# Patient Record
Sex: Female | Born: 1985 | Race: Black or African American | Hispanic: No | Smoking: Former smoker
Health system: Southern US, Community
[De-identification: ages and names within clinical notes are randomized; demographics above are authoritative.]

## PROBLEM LIST (undated history)

## (undated) DIAGNOSIS — IMO0002 Reserved for concepts with insufficient information to code with codable children: Secondary | ICD-10-CM

## (undated) DIAGNOSIS — R87619 Unspecified abnormal cytological findings in specimens from cervix uteri: Secondary | ICD-10-CM

## (undated) DIAGNOSIS — K219 Gastro-esophageal reflux disease without esophagitis: Secondary | ICD-10-CM

## (undated) DIAGNOSIS — D759 Disease of blood and blood-forming organs, unspecified: Secondary | ICD-10-CM

## (undated) DIAGNOSIS — F209 Schizophrenia, unspecified: Secondary | ICD-10-CM

## (undated) DIAGNOSIS — F32A Depression, unspecified: Secondary | ICD-10-CM

## (undated) DIAGNOSIS — D573 Sickle-cell trait: Secondary | ICD-10-CM

## (undated) DIAGNOSIS — F319 Bipolar disorder, unspecified: Secondary | ICD-10-CM

## (undated) DIAGNOSIS — F99 Mental disorder, not otherwise specified: Secondary | ICD-10-CM

## (undated) DIAGNOSIS — D649 Anemia, unspecified: Secondary | ICD-10-CM

## (undated) DIAGNOSIS — F329 Major depressive disorder, single episode, unspecified: Secondary | ICD-10-CM

## (undated) DIAGNOSIS — M419 Scoliosis, unspecified: Secondary | ICD-10-CM

## (undated) HISTORY — DX: Major depressive disorder, single episode, unspecified: F32.9

## (undated) HISTORY — DX: Disease of blood and blood-forming organs, unspecified: D75.9

## (undated) HISTORY — DX: Depression, unspecified: F32.A

## (undated) HISTORY — DX: Unspecified abnormal cytological findings in specimens from cervix uteri: R87.619

## (undated) HISTORY — PX: LAPAROSCOPIC CHOLECYSTECTOMY: SUR755

## (undated) HISTORY — DX: Anemia, unspecified: D64.9

## (undated) HISTORY — DX: Reserved for concepts with insufficient information to code with codable children: IMO0002

---
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
## 2004-04-02 ENCOUNTER — Emergency Department (HOSPITAL_COMMUNITY): Admission: EM | Admit: 2004-04-02 | Discharge: 2004-04-02 | Payer: Self-pay | Admitting: *Deleted

## 2005-02-12 ENCOUNTER — Emergency Department (HOSPITAL_COMMUNITY): Admission: EM | Admit: 2005-02-12 | Discharge: 2005-02-12 | Payer: Self-pay | Admitting: Emergency Medicine

## 2005-02-14 ENCOUNTER — Emergency Department (HOSPITAL_COMMUNITY): Admission: EM | Admit: 2005-02-14 | Discharge: 2005-02-14 | Payer: Self-pay | Admitting: Emergency Medicine

## 2005-07-19 ENCOUNTER — Emergency Department (HOSPITAL_COMMUNITY): Admission: EM | Admit: 2005-07-19 | Discharge: 2005-07-19 | Payer: Self-pay | Admitting: Emergency Medicine

## 2005-08-31 ENCOUNTER — Emergency Department (HOSPITAL_COMMUNITY): Admission: EM | Admit: 2005-08-31 | Discharge: 2005-08-31 | Payer: Self-pay | Admitting: Emergency Medicine

## 2006-05-01 ENCOUNTER — Emergency Department (HOSPITAL_COMMUNITY): Admission: EM | Admit: 2006-05-01 | Discharge: 2006-05-01 | Payer: Self-pay | Admitting: Emergency Medicine

## 2007-03-15 ENCOUNTER — Inpatient Hospital Stay (HOSPITAL_COMMUNITY): Admission: EM | Admit: 2007-03-15 | Discharge: 2007-03-16 | Payer: Self-pay | Admitting: Emergency Medicine

## 2007-05-20 ENCOUNTER — Encounter (HOSPITAL_COMMUNITY): Admission: RE | Admit: 2007-05-20 | Discharge: 2007-06-19 | Payer: Self-pay | Admitting: Orthopedic Surgery

## 2007-06-10 ENCOUNTER — Emergency Department (HOSPITAL_COMMUNITY): Admission: EM | Admit: 2007-06-10 | Discharge: 2007-06-10 | Payer: Self-pay | Admitting: Emergency Medicine

## 2008-03-01 ENCOUNTER — Other Ambulatory Visit: Admission: RE | Admit: 2008-03-01 | Discharge: 2008-03-01 | Payer: Self-pay | Admitting: Obstetrics and Gynecology

## 2008-03-08 ENCOUNTER — Ambulatory Visit (HOSPITAL_COMMUNITY): Admission: RE | Admit: 2008-03-08 | Discharge: 2008-03-08 | Payer: Self-pay | Admitting: Obstetrics & Gynecology

## 2008-08-14 ENCOUNTER — Inpatient Hospital Stay (HOSPITAL_COMMUNITY): Admission: AD | Admit: 2008-08-14 | Discharge: 2008-08-16 | Payer: Self-pay | Admitting: Obstetrics and Gynecology

## 2009-10-16 ENCOUNTER — Emergency Department (HOSPITAL_COMMUNITY): Admission: EM | Admit: 2009-10-16 | Discharge: 2009-10-16 | Payer: Self-pay | Admitting: Emergency Medicine

## 2010-01-07 NOTE — L&D Delivery Note (Signed)
Delivery Note At 1:22 PM a viable female was delivered via Vaginal, Spontaneous Delivery (Presentation: Left Occiput Anterior).  APGAR: 6, 7; weight 5 lb 0.4 oz (2280 g).   Placenta status: Intact, Spontaneous.  Cord: 3 vessels with the following complications: None.  Cord pH: n/a  Anesthesia: Epidural  Episiotomy: None Lacerations: None Suture Repair: n/a Est. Blood Loss (mL): 250  Mom to postpartum.  Baby to nursery-stable.  [name],Jennifer Barber 09/29/2010, 1:59 PM

## 2010-03-05 ENCOUNTER — Emergency Department (HOSPITAL_COMMUNITY)
Admission: EM | Admit: 2010-03-05 | Discharge: 2010-03-05 | Disposition: A | Discharge status: Home / Self Care | Payer: Medicaid Other | Attending: Emergency Medicine | Admitting: Emergency Medicine

## 2010-03-05 ENCOUNTER — Emergency Department (HOSPITAL_COMMUNITY): Payer: Medicaid Other

## 2010-03-05 DIAGNOSIS — R112 Nausea with vomiting, unspecified: Secondary | ICD-10-CM | POA: Insufficient documentation

## 2010-03-05 DIAGNOSIS — Z331 Pregnant state, incidental: Secondary | ICD-10-CM | POA: Insufficient documentation

## 2010-03-05 DIAGNOSIS — N898 Other specified noninflammatory disorders of vagina: Secondary | ICD-10-CM | POA: Insufficient documentation

## 2010-03-05 DIAGNOSIS — R109 Unspecified abdominal pain: Secondary | ICD-10-CM | POA: Insufficient documentation

## 2010-03-05 LAB — COMPREHENSIVE METABOLIC PANEL
ALT: 11 U/L (ref 0–35)
AST: 14 U/L (ref 0–37)
Albumin: 3.9 g/dL (ref 3.5–5.2)
Alkaline Phosphatase: 41 U/L (ref 39–117)
BUN: 5 mg/dL — ABNORMAL LOW (ref 6–23)
CO2: 23 mEq/L (ref 19–32)
Calcium: 9.1 mg/dL (ref 8.4–10.5)
Chloride: 102 mEq/L (ref 96–112)
Creatinine, Ser: 0.76 mg/dL (ref 0.4–1.2)
GFR calc Af Amer: >60 mL/min (ref >60)
GFR calc non Af Amer: >60 mL/min (ref >60)
Glucose, Bld: 91 mg/dL (ref 70–99)
Potassium: 3.4 mEq/L — ABNORMAL LOW (ref 3.5–5.1)
Sodium: 135 mEq/L (ref 135–145)
Total Bilirubin: 0.7 mg/dL (ref 0.3–1.2)
Total Protein: 7.1 g/dL (ref 6.0–8.3)

## 2010-03-05 LAB — URINE MICROSCOPIC-ADD ON

## 2010-03-05 LAB — CBC
HCT: 34.6 % — ABNORMAL LOW (ref 36.0–46.0)
Hemoglobin: 11.8 g/dL — ABNORMAL LOW (ref 12.0–15.0)
MCH: 27.3 pg (ref 26.0–34.0)
MCHC: 34.1 g/dL (ref 30.0–36.0)
MCV: 79.9 fL (ref 78.0–100.0)
Platelets: 276 10*3/uL (ref 150–400)
RBC: 4.33 MIL/uL (ref 3.87–5.11)
RDW: 12.8 % (ref 11.5–15.5)
WBC: 8.8 10*3/uL (ref 4.0–10.5)

## 2010-03-05 LAB — WET PREP, GENITAL
Trich, Wet Prep: NONE SEEN
WBC, Wet Prep HPF POC: NONE SEEN
Yeast Wet Prep HPF POC: NONE SEEN

## 2010-03-05 LAB — URINALYSIS, ROUTINE W REFLEX MICROSCOPIC
Hgb urine dipstick: NEGATIVE
Ketones, ur: >80 mg/dL — AB
Nitrite: NEGATIVE
Protein, ur: NEGATIVE mg/dL
Specific Gravity, Urine: 1.025 (ref 1.005–1.030)
Urine Glucose, Fasting: NEGATIVE mg/dL
Urobilinogen, UA: 0.2 mg/dL (ref 0.0–1.0)
pH: 5.5 (ref 5.0–8.0)

## 2010-03-05 LAB — HCG, QUANTITATIVE, PREGNANCY: hCG, Beta Chain, Quant, S: 98071 m[IU]/mL — ABNORMAL HIGH (ref <5)

## 2010-03-05 LAB — DIFFERENTIAL
Basophils Absolute: 0 10*3/uL (ref 0.0–0.1)
Basophils Relative: 1 % (ref 0–1)
Eosinophils Absolute: 0 10*3/uL (ref 0.0–0.7)
Eosinophils Relative: 1 % (ref 0–5)
Lymphocytes Relative: 25 % (ref 12–46)
Lymphs Abs: 2.2 10*3/uL (ref 0.7–4.0)
Monocytes Absolute: 0.5 10*3/uL (ref 0.1–1.0)
Monocytes Relative: 6 % (ref 3–12)
Neutro Abs: 6 10*3/uL (ref 1.7–7.7)
Neutrophils Relative %: 68 % (ref 43–77)

## 2010-03-05 LAB — PREGNANCY, URINE: Preg Test, Ur: POSITIVE

## 2010-03-05 LAB — RPR: RPR Ser Ql: NONREACTIVE

## 2010-03-05 LAB — ABO/RH: ABO/RH(D): A POS

## 2010-03-06 LAB — GC/CHLAMYDIA PROBE AMP, GENITAL
Chlamydia, DNA Probe: NEGATIVE
GC Probe Amp, Genital: NEGATIVE

## 2010-03-10 ENCOUNTER — Emergency Department (HOSPITAL_COMMUNITY)
Admission: EM | Admit: 2010-03-10 | Discharge: 2010-03-10 | Disposition: A | Discharge status: Home / Self Care | Payer: Medicaid Other | Attending: Emergency Medicine | Admitting: Emergency Medicine

## 2010-03-10 DIAGNOSIS — N898 Other specified noninflammatory disorders of vagina: Secondary | ICD-10-CM | POA: Insufficient documentation

## 2010-03-10 DIAGNOSIS — O210 Mild hyperemesis gravidarum: Secondary | ICD-10-CM | POA: Insufficient documentation

## 2010-03-10 LAB — URINALYSIS, ROUTINE W REFLEX MICROSCOPIC
Bilirubin Urine: NEGATIVE
Glucose, UA: NEGATIVE mg/dL
Hgb urine dipstick: NEGATIVE
Nitrite: NEGATIVE
Protein, ur: NEGATIVE mg/dL
Specific Gravity, Urine: 1.025 (ref 1.005–1.030)
Urobilinogen, UA: 0.2 mg/dL (ref 0.0–1.0)
pH: 6.5 (ref 5.0–8.0)

## 2010-03-10 LAB — WET PREP, GENITAL
Trich, Wet Prep: NONE SEEN
Yeast Wet Prep HPF POC: NONE SEEN

## 2010-03-10 LAB — HCG, QUANTITATIVE, PREGNANCY: hCG, Beta Chain, Quant, S: 196239 m[IU]/mL — ABNORMAL HIGH (ref <5)

## 2010-03-11 LAB — RPR: RPR Ser Ql: NONREACTIVE

## 2010-03-12 LAB — GC/CHLAMYDIA PROBE AMP, GENITAL
Chlamydia, DNA Probe: NEGATIVE
GC Probe Amp, Genital: NEGATIVE

## 2010-03-21 LAB — HIV ANTIBODY (ROUTINE TESTING W REFLEX): HIV: NONREACTIVE

## 2010-03-21 LAB — ANTIBODY SCREEN: Antibody Screen: NEGATIVE

## 2010-03-21 LAB — HEPATITIS B SURFACE ANTIGEN: Hepatitis B Surface Ag: NEGATIVE

## 2010-04-05 ENCOUNTER — Other Ambulatory Visit: Payer: Self-pay | Admitting: Obstetrics & Gynecology

## 2010-04-05 ENCOUNTER — Other Ambulatory Visit (HOSPITAL_COMMUNITY)
Admission: RE | Admit: 2010-04-05 | Discharge: 2010-04-05 | Disposition: A | Discharge status: Home / Self Care | Payer: Medicaid Other | Source: Ambulatory Visit | Attending: Obstetrics & Gynecology | Admitting: Obstetrics & Gynecology

## 2010-04-05 DIAGNOSIS — Z01419 Encounter for gynecological examination (general) (routine) without abnormal findings: Secondary | ICD-10-CM | POA: Insufficient documentation

## 2010-04-05 DIAGNOSIS — Z113 Encounter for screening for infections with a predominantly sexual mode of transmission: Secondary | ICD-10-CM | POA: Insufficient documentation

## 2010-04-05 DIAGNOSIS — Z1159 Encounter for screening for other viral diseases: Secondary | ICD-10-CM | POA: Insufficient documentation

## 2010-04-14 LAB — CBC
HCT: 34 % — ABNORMAL LOW (ref 36.0–46.0)
HCT: 34.9 % — ABNORMAL LOW (ref 36.0–46.0)
Hemoglobin: 11.4 g/dL — ABNORMAL LOW (ref 12.0–15.0)
Hemoglobin: 11.7 g/dL — ABNORMAL LOW (ref 12.0–15.0)
MCHC: 33.5 g/dL (ref 30.0–36.0)
MCHC: 33.6 g/dL (ref 30.0–36.0)
MCV: 86.2 fL (ref 78.0–100.0)
MCV: 87.5 fL (ref 78.0–100.0)
Platelets: 225 10*3/uL (ref 150–400)
Platelets: 238 10*3/uL (ref 150–400)
RBC: 3.88 MIL/uL (ref 3.87–5.11)
RBC: 4.05 MIL/uL (ref 3.87–5.11)
RDW: 13.4 % (ref 11.5–15.5)
RDW: 13.5 % (ref 11.5–15.5)
WBC: 16.9 10*3/uL — ABNORMAL HIGH (ref 4.0–10.5)
WBC: 8.4 10*3/uL (ref 4.0–10.5)

## 2010-04-14 LAB — RPR: RPR Ser Ql: NONREACTIVE
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
## 2010-05-25 NOTE — Op Note (Signed)
NAME:  JULINE, SANDERFORD                     ACCOUNT NO.:  [id number]   MEDICAL RECORD NO.:  [id number]                   PATIENT TYPE:  INP   LOCATION:  A417                                 FACILITY:  [facility]   PHYSICIAN:  Lazaro Arms, M.D.                DATE OF BIRTH:  1985-06-07   DATE OF PROCEDURE:  DATE OF DISCHARGE:                                 OPERATIVE REPORT   EPIDURAL PLACEMENT NOTE   Zakeria is a 25 year old, gravida 1 at 5 cm dilation and spontaneous labor  who requests an epidural.   The patient is placed in sitting position.  The L2-L3 interspace is  identified.  Betadine prep is used.  Lidocaine 1% is injected as a local  anesthetic.  The area is field draped.  A 17-gauge Touhy needle is used and  a loss of resistance technique employed; and, with 1 Oley, the epidural  space is found without difficulty.  Ten cc of 0.125% bupivacaine is given  through the epidural needle without ill effects.  The epidural catheter is  then fed 5 cm into the epidural space and taped down at that point.  An  additional 10 cc of 0.125% bupivacaine is given without ill effects.  Blood  pressure is stable.  There is reactive NST.  The continuous pump of 0.125%  bupivacaine in 2 mcg/mL of Fentanyl was then hooked up at 12 cc an hour.      ___________________________________________                                            Lazaro Arms, M.D.   LHE/MEDQ  D:  05/06/2003  T:  05/06/2003  Job:  [id number]

## 2010-05-25 NOTE — Op Note (Signed)
NAME:  SHANYAH, GATTUSO                     ACCOUNT NO.:  [id number]   MEDICAL RECORD NO.:  [id number]                   PATIENT TYPE:  INP   LOCATION:  A403                                 FACILITY:  [facility]   PHYSICIAN:  Tilda Burrow, M.D.              DATE OF BIRTH:  [birth year]   DATE OF PROCEDURE:  DATE OF DISCHARGE:  05/08/2003                                 OPERATIVE REPORT   (CORRECTED COPY)   LABOR COURSE:  Alyzza finally began pushing after some of her epidural had  worn off, and she began feeling an urge to push.  After about an hour of  pushing, she delivered a viable female infant at 12:53.  The mouth and nose  were suctioned on the perineum and the body delivered without difficulty.  Weight is pending, although it looks to be about 6 pounds, and Apgars are 9  and 9.  Pitocin, 20 units, diluted in 1000 cc of lactated Ringer's was then  infused rapidly IV.  The placenta separated spontaneously and was delivered  via controlled cord traction at 12:56.  It was inspected and appeared to be  intact with a three-vessel cord.  Very little blood loss was noted.  The  vagina was inspected, and no lacerations were found.  The epidural catheter  was removed with the blue tip visualized as being intact.     ________________________________________  ___________________________________________  Jacklyn Shell, C.N.M.           Tilda Burrow, M.D.   FC/MEDQ  D:  05/06/2003  T:  05/06/2003  Job:  [id number]   cc:   [facility]
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
## 2010-05-25 NOTE — Op Note (Signed)
Jennifer Barber, Jennifer Barber                     ACCOUNT NO.:  [id number]   MEDICAL RECORD NO.:  [id number]                   PATIENT TYPE:  INP   LOCATION:  A407                                 FACILITY:  [facility]   PHYSICIAN:  Dirk Dress. Katrinka Blazing, M.D.                DATE OF BIRTH:  [birth year]   DATE OF PROCEDURE:  04/05/2003  DATE OF DISCHARGE:  04/07/2003                                 OPERATIVE REPORT   PREOPERATIVE DIAGNOSIS:  Cholelithiasis, cholecystitis and intrauterine  pregnancy, 33 weeks.   POSTOPERATIVE DIAGNOSIS:  Cholelithiasis, cholecystitis and intrauterine  pregnancy, 33 weeks.   OPERATION/PROCEDURE:  Cholecystectomy.   SURGEON:  Dirk Dress. Katrinka Blazing, M.D.  Tilda Burrow, M.D.   DESCRIPTION OF PROCEDURE:  Under general anesthesia the patient's abdomen  was prepped and draped into a sterile field.  Fetal heart tones were checked  immediately preoperatively and were 148 and regular.  A limited right  subcostal incision was made.  With the assistance of Dr. Emelda Fear, [facility], the uterus was retracted inferiorly.  The gallbladder was grasped with  a Kelly clamp. It was dissected with electrocautery down to the cystic  artery and the cystic duct.  Cystic artery was clipped with five clips and  divided.  Cystic duct was dissected down to its juncture with the common  bile duct.  It was clipped close to the gallbladder with four clips and  divided.  Hemostasis was achieved.  There was no bleeding from the bed.  The  patient tolerated the procedure well.  Sponge, needle, instrument, and blade  counts were verified as correct.  The abdomen was closed using 0 Prolene on  the fascial layers, 2-0 Biosyn on the subcutaneous tissue and staples on the  skin.  Postoperative fetal heart tones revealed a rate of 145 and regular.  Dressings were placed.  The patient was awakened from anesthesia  uneventfully and transferred to a bed and taken to the post anesthesia care  unit.  Fetal  heart tone monitor in the recovery room revealed heart rate  between 145 and 150.      ___________________________________________                                            Dirk Dress. Katrinka Blazing, M.D.   LCS/MEDQ  D:  05/07/2003  T:  05/07/2003  Job:  [id number]

## 2010-05-25 NOTE — H&P (Signed)
NAME:  Jennifer Barber, Jennifer Barber                     ACCOUNT NO.:  [id number]   MEDICAL RECORD NO.:  [id number]                   PATIENT TYPE:  OIB   LOCATION:  A418                                 FACILITY:  [facility]   PHYSICIAN:  Tilda Burrow, M.D.              DATE OF BIRTH:  Jul 08, 1985   DATE OF ADMISSION:  04/04/2003  DATE OF DISCHARGE:                                HISTORY & PHYSICAL   REASON FOR ADMISSION:  Pregnancy at approximately 32 to 33 weeks with a  previous diagnosis of cholelithiasis with nausea, vomiting, and upper  quadrant pain.   PAST MEDICAL HISTORY:  Negative.   PAST SURGICAL HISTORY:  Negative.   ALLERGIES:  SEASONAL allergies.   MEDICATIONS:  She is taking prenatal vitamins.   SOCIAL HISTORY:  She lives with her mother.  Her boyfriend is present,  supportive.  This is her first pregnancy.  Her EDC is May 18, 2003.   PRENATAL COURSE:  Prenatal course has been complicated by gallstones and  gallbladder colic.  Blood type is A positive.  Rubella is immune.  Hepatis B  surface antigen is negative.  HIV is negative.  Serology is nonreactive.  Pap is  within normal limits.  GC and chlamydia are negative.  MSAFP was  within normal limits..  Hemoglobin 11.1, hematocrit 33.4.  One-hour glucose  81, sickle cell screen positive.   Her vital signs are normal.  The patient does have right upper quadrant  tenderness.  She is vomiting at present time a large amount of clear liquid.  Her labs are  within normal limits in regards to her CBC.  Her liver panel  and her amylase and lipase are still pending.  She is afebrile.   IMPRESSION:  Pregnancy at 33 weeks and 4 days with cholelithiasis.   PLAN:  We are going to admit, do IV fluids for hydration, antiemetics, pain  management, and get a gallbladder ultrasound, liver, amylase, lipase, and  CBC.  Those labs are pending.     _____________________________________  ___________________________________________  Zerita Boers, Lanier Clam                    Tilda Burrow, M.D.   DL/MEDQ  D:  16/10/9602  T:  04/04/2003  Job:  [id number]   cc:   [facility]

## 2010-07-22 ENCOUNTER — Encounter: Payer: Self-pay | Admitting: Obstetrics and Gynecology

## 2010-09-17 ENCOUNTER — Inpatient Hospital Stay (HOSPITAL_COMMUNITY)
Admission: AD | Admit: 2010-09-17 | Discharge: 2010-09-17 | Disposition: A | Discharge status: Home / Self Care | Payer: Medicaid Other | Source: Ambulatory Visit | Attending: Obstetrics and Gynecology | Admitting: Obstetrics and Gynecology

## 2010-09-17 ENCOUNTER — Encounter (HOSPITAL_COMMUNITY): Payer: Self-pay

## 2010-09-17 DIAGNOSIS — O4700 False labor before 37 completed weeks of gestation, unspecified trimester: Secondary | ICD-10-CM | POA: Insufficient documentation

## 2010-09-17 DIAGNOSIS — Z3490 Encounter for supervision of normal pregnancy, unspecified, unspecified trimester: Secondary | ICD-10-CM

## 2010-09-17 DIAGNOSIS — R8271 Bacteriuria: Secondary | ICD-10-CM

## 2010-09-17 DIAGNOSIS — O479 False labor, unspecified: Secondary | ICD-10-CM

## 2010-09-17 HISTORY — DX: Mental disorder, not otherwise specified: F99

## 2010-09-17 LAB — URINALYSIS, ROUTINE W REFLEX MICROSCOPIC
Bilirubin Urine: NEGATIVE
Glucose, UA: NEGATIVE mg/dL
Ketones, ur: NEGATIVE mg/dL
Nitrite: NEGATIVE
Protein, ur: NEGATIVE mg/dL
Specific Gravity, Urine: 1.02 (ref 1.005–1.030)
Urobilinogen, UA: 1 mg/dL (ref 0.0–1.0)
pH: 7.5 (ref 5.0–8.0)

## 2010-09-17 LAB — URINE MICROSCOPIC-ADD ON

## 2010-09-17 MED ORDER — ZOLPIDEM TARTRATE 5 MG PO TABS
5.0000 mg | ORAL_TABLET | Freq: Once | ORAL | Status: AC
  Administered 2010-09-17: 5 mg via ORAL
  Filled 2010-09-17: qty 1

## 2010-09-17 MED ORDER — ACETAMINOPHEN 325 MG PO TABS
650.0000 mg | ORAL_TABLET | Freq: Once | ORAL | Status: AC
  Administered 2010-09-17: 650 mg via ORAL
  Filled 2010-09-17: qty 2

## 2010-09-17 MED ORDER — NITROFURANTOIN MONOHYD MACRO 100 MG PO CAPS: 100.0000 mg | ORAL_CAPSULE | Freq: Two times a day (BID) | ORAL | Status: AC

## 2010-09-17 NOTE — ED Provider Notes (Addendum)
History     Chief Complaint  Patient presents with  . Contractions   HPI Pt presents at 34 adn 5 based on 9 week ultrasound [facility] 10/24/10) with report of contractions that worsened in intensity and frequency around 8 this AM. She has had intermittent contractions over the last week, but they were not painful. She denies vaginal bleeding or LOF. She reports fetal movement is decreased from baseline. She denies N/V. She last ate cereal this morning.    OB History    Grav Para Term Preterm Abortions TAB SAB Ect Mult Living   4 2 1 1 1  0 1 0 0 2      Past Medical History  Diagnosis Date  . Abnormal Pap smear   . Blood dyscrasia   . Anemia   . Mental disorder     bipolar, schizophrenia    Past Surgical History  Procedure Date  . Laparoscopic cholecystectomy     during second pregnancy  . Femoral osteotomy w/ rodding 2004    s/p accident , leg fx    No family history on file.  History  Substance Use Topics  . Smoking status: Never Smoker   . Smokeless tobacco: Never Used  . Alcohol Use: No    Allergies:  Allergies  Allergen Reactions  . Codeine     Prescriptions prior to admission  Medication Sig Dispense Refill  . citalopram (CELEXA) 20 MG tablet Take 20 mg by mouth daily.        . prenatal vitamin w/FE, FA (PRENATAL 1 + 1) 27-1 MG TABS Take 1 tablet by mouth daily.        Marland Kitchen zolpidem (AMBIEN) 10 MG tablet Take 5 mg by mouth at bedtime as needed. sleep       . Prenatal Vit-Fe Fumarate-FA (PRENATAL MULTIVITAMIN) 60-1 MG tablet Take 1 tablet by mouth daily.         ROS Physical Exam   Blood pressure 103/63, pulse 65, temperature 97 F (36.1 C), temperature source Oral, resp. rate 20, height 5' (1.524 m), weight 135 lb (61.236 kg), SpO2 100.00%, unknown if currently breastfeeding.  Physical Exam BP 103/63  Pulse 65  Temp(Src) 97 F (36.1 C) (Oral)  Resp 20  Ht 5' (1.524 m)  Wt 135 lb (61.236 kg)  BMI 26.37 kg/m2  SpO2 100%  Breastfeeding?  Unknown General appearance: alert, cooperative and mild distress Head: Normocephalic, without obvious abnormality, atraumatic Eyes: conjunctivae/corneas clear. PERRL, EOM's intact.  Lungs: clear to auscultation bilaterally Heart: regular rate and rhythm, S1, S2 normal, no murmur, click, rub or gallop Abdomen: gravid, est fetal weight 7lbs by [place].  Extremities: extremities normal, atraumatic, no cyanosis or edema and no edema, redness or tenderness in the calves or thighs Dilation: 3 Effacement (%): 50 Station: -3 Presentation: Vertex Exam by:: Lucy Chris RNC Fetal heart rate: 140s, moderate variability, positive accels, no decels.  Toco: regular q 2 to 3.  MAU Course  Procedures   Assessment and Plan  25 yo [id number] at 34 and 5 weeks presents with contractions.  1. Reviewed preantal labs- A=, HIV-, RPR-, rubella immune. GBS unknown.  2. Plan to monitor and recheck cervix in one hours if she has cervical change will admit to L&D.  Jettie Lazare 09/17/2010, 12:19 PM    Dilation: 3 Effacement (%): 50 Cervical Position: Posterior Station: -3 Presentation: Vertex Exam by:: Lucy Chris RNC  No change in RN cervical exam. Reviewed UA. Suggestive of bacterial cystitis.  Patient is still contracting  and complaining of back pain.   Assessment: 1. False labor: gave labor precautions. Ambien for relaxation. 2. Asymptomactic bacteruria: will send Urine for culture and treat with Macrobid.

## 2010-09-17 NOTE — Progress Notes (Signed)
Pt states contractions started this morning at 0830 & have been 2 minutes apart. Denies LOF or vaginal bleeding.

## 2010-09-28 ENCOUNTER — Inpatient Hospital Stay (HOSPITAL_COMMUNITY)
Admission: AD | Admit: 2010-09-28 | Discharge: 2010-10-01 | DRG: 775 | Disposition: A | Discharge status: Home / Self Care | Payer: Medicaid Other | Source: Ambulatory Visit | Attending: Obstetrics & Gynecology | Admitting: Obstetrics & Gynecology

## 2010-09-28 ENCOUNTER — Encounter (HOSPITAL_COMMUNITY): Payer: Self-pay | Admitting: Anesthesiology

## 2010-09-28 ENCOUNTER — Inpatient Hospital Stay (HOSPITAL_COMMUNITY): Payer: Medicaid Other | Admitting: Anesthesiology

## 2010-09-28 ENCOUNTER — Encounter (HOSPITAL_COMMUNITY): Payer: Self-pay

## 2010-09-28 DIAGNOSIS — R197 Diarrhea, unspecified: Secondary | ICD-10-CM

## 2010-09-28 DIAGNOSIS — Z3490 Encounter for supervision of normal pregnancy, unspecified, unspecified trimester: Secondary | ICD-10-CM

## 2010-09-28 DIAGNOSIS — M62830 Muscle spasm of back: Secondary | ICD-10-CM

## 2010-09-28 HISTORY — DX: Scoliosis, unspecified: M41.9

## 2010-09-28 HISTORY — DX: Bipolar disorder, unspecified: F31.9

## 2010-09-28 HISTORY — DX: Schizophrenia, unspecified: F20.9

## 2010-09-28 LAB — CBC
HCT: 31.2 % — ABNORMAL LOW (ref 36.0–46.0)
Hemoglobin: 10.5 g/dL — ABNORMAL LOW (ref 12.0–15.0)
MCH: 28.3 pg (ref 26.0–34.0)
MCHC: 33.7 g/dL (ref 30.0–36.0)
MCV: 84.1 fL (ref 78.0–100.0)
Platelets: 178 10*3/uL (ref 150–400)
RBC: 3.71 MIL/uL — ABNORMAL LOW (ref 3.87–5.11)
RDW: 12.8 % (ref 11.5–15.5)
WBC: 9.3 10*3/uL (ref 4.0–10.5)

## 2010-09-28 LAB — RAPID URINE DRUG SCREEN, HOSP PERFORMED
Amphetamines: NOT DETECTED
Barbiturates: NOT DETECTED
Benzodiazepines: NOT DETECTED
Cocaine: NOT DETECTED
Opiates: NOT DETECTED
Tetrahydrocannabinol: NOT DETECTED

## 2010-09-28 MED ORDER — DIPHENHYDRAMINE HCL 50 MG/ML IJ SOLN: 12.5000 mg | INTRAMUSCULAR | Status: DC | PRN

## 2010-09-28 MED ORDER — CITRIC ACID-SODIUM CITRATE 334-500 MG/5ML PO SOLN: 30.0000 mL | ORAL | Status: DC | PRN

## 2010-09-28 MED ORDER — OXYTOCIN 20 UNITS IN LACTATED RINGERS INFUSION - SIMPLE: 125.0000 mL/h | Freq: Once | INTRAVENOUS | Status: DC

## 2010-09-28 MED ORDER — OXYTOCIN BOLUS FROM INFUSION
500.0000 mL | Freq: Once | INTRAVENOUS | Status: DC
  Filled 2010-09-28: qty 500

## 2010-09-28 MED ORDER — CITALOPRAM HYDROBROMIDE 20 MG PO TABS
20.0000 mg | ORAL_TABLET | Freq: Every day | ORAL | Status: DC
  Administered 2010-09-28 – 2010-09-29 (×2): 20 mg via ORAL
  Filled 2010-09-28 (×3): qty 1

## 2010-09-28 MED ORDER — FLEET ENEMA 7-19 GM/118ML RE ENEM: 1.0000 | ENEMA | RECTAL | Status: DC | PRN

## 2010-09-28 MED ORDER — BUTORPHANOL TARTRATE 2 MG/ML IJ SOLN
1.0000 mg | INTRAMUSCULAR | Status: DC | PRN
  Administered 2010-09-28: 1 mg via INTRAVENOUS
  Filled 2010-09-28: qty 1

## 2010-09-28 MED ORDER — EPHEDRINE 5 MG/ML INJ
10.0000 mg | INTRAVENOUS | Status: DC | PRN
  Filled 2010-09-28: qty 4

## 2010-09-28 MED ORDER — IBUPROFEN 600 MG PO TABS: 600.0000 mg | ORAL_TABLET | Freq: Four times a day (QID) | ORAL | Status: DC | PRN

## 2010-09-28 MED ORDER — PHENYLEPHRINE 40 MCG/ML (10ML) SYRINGE FOR IV PUSH (FOR BLOOD PRESSURE SUPPORT)
80.0000 ug | PREFILLED_SYRINGE | INTRAVENOUS | Status: DC | PRN
  Filled 2010-09-28: qty 5

## 2010-09-28 MED ORDER — FENTANYL 2.5 MCG/ML BUPIVACAINE 1/10 % EPIDURAL INFUSION (WH - ANES)
14.0000 mL/h | INTRAMUSCULAR | Status: DC
  Administered 2010-09-28 (×2): 12 mL/h via EPIDURAL
  Administered 2010-09-29: 14 mL/h via EPIDURAL
  Administered 2010-09-29: 12 mL/h via EPIDURAL
  Administered 2010-09-29: 14 mL/h via EPIDURAL
  Filled 2010-09-28 (×5): qty 60

## 2010-09-28 MED ORDER — LIDOCAINE HCL (PF) 1 % IJ SOLN
30.0000 mL | INTRAMUSCULAR | Status: DC | PRN
  Filled 2010-09-28: qty 30

## 2010-09-28 MED ORDER — SODIUM BICARBONATE 8.4 % IV SOLN
INTRAVENOUS | Status: DC | PRN
  Administered 2010-09-28: 3 mL via EPIDURAL

## 2010-09-28 MED ORDER — ONDANSETRON HCL 4 MG/2ML IJ SOLN
4.0000 mg | Freq: Four times a day (QID) | INTRAMUSCULAR | Status: DC | PRN
  Administered 2010-09-28: 4 mg via INTRAVENOUS
  Filled 2010-09-28: qty 2

## 2010-09-28 MED ORDER — PHENYLEPHRINE 40 MCG/ML (10ML) SYRINGE FOR IV PUSH (FOR BLOOD PRESSURE SUPPORT)
80.0000 ug | PREFILLED_SYRINGE | INTRAVENOUS | Status: DC | PRN
  Filled 2010-09-28 (×2): qty 5

## 2010-09-28 MED ORDER — ACETAMINOPHEN 325 MG PO TABS: 650.0000 mg | ORAL_TABLET | ORAL | Status: DC | PRN

## 2010-09-28 MED ORDER — EPHEDRINE 5 MG/ML INJ
10.0000 mg | INTRAVENOUS | Status: DC | PRN
  Filled 2010-09-28 (×2): qty 4

## 2010-09-28 MED ORDER — LACTATED RINGERS IV SOLN
INTRAVENOUS | Status: DC
  Administered 2010-09-29 (×2): via INTRAVENOUS

## 2010-09-28 MED ORDER — OXYCODONE-ACETAMINOPHEN 5-325 MG PO TABS: 2.0000 | ORAL_TABLET | ORAL | Status: DC | PRN

## 2010-09-28 MED ORDER — LACTATED RINGERS IV SOLN: 500.0000 mL | INTRAVENOUS | Status: DC | PRN

## 2010-09-28 MED ORDER — LACTATED RINGERS IV SOLN
500.0000 mL | Freq: Once | INTRAVENOUS | Status: AC
  Administered 2010-09-28: 1000 mL via INTRAVENOUS

## 2010-09-28 NOTE — H&P (Signed)
Jennifer Barber is a 25 y.o. female presenting for Contractions. Brought in by EMS. Gets care at [facility]. Maternal Medical History:  Reason for admission: Reason for admission: contractions.  Contractions: Onset was 1-2 hours ago.   Frequency: regular.   Duration is approximately 60 seconds.   Perceived severity is moderate.    Fetal activity: Perceived fetal activity is normal.   Last perceived fetal movement was within the past hour.      OB History    Grav Para Term Preterm Abortions TAB SAB Ect Mult Living   4 2 2  0 1 0 1 0 0 2     Past Medical History  Diagnosis Date  . Abnormal Pap smear   . Blood dyscrasia   . Anemia   . Mental disorder     bipolar, schizophrenia  . Bipolar 1 disorder   . Schizophrenia    Past Surgical History  Procedure Date  . Laparoscopic cholecystectomy     during second pregnancy  . Femoral osteotomy w/ rodding 2004    s/p accident , leg fx   Family History: family history is not on file. Social History:  reports that she has never smoked. She has never used smokeless tobacco. She reports that she does not drink alcohol or use illicit drugs.  Review of Systems  Constitutional: Negative.   HENT: Negative.   Eyes: Negative.   Respiratory: Negative.   Cardiovascular: Negative.   Gastrointestinal: Negative.   Genitourinary:       Vaginal discharge, but not like her water broke, no vaginal bleeding.  Musculoskeletal: Negative.   Skin: Negative.   Neurological: Negative.   Endo/Heme/Allergies: Negative.   Psychiatric/Behavioral: Negative.     Dilation: 4 Effacement (%): 50 Station: -1 Exam by:: VSmith, CNM Blood pressure 107/56, pulse 97, temperature 97.5 F (36.4 C), temperature source Oral, resp. rate 20, height 5' (1.524 m), weight 60.782 kg (134 lb), unknown if currently breastfeeding. Maternal Exam:  Uterine Assessment: Contraction strength is moderate.  Contraction duration is 60 seconds. Contraction frequency is irregular.     Abdomen: Fetal presentation: vertex  Introitus: Vagina is positive for vaginal discharge.  Ferning test: negative.  Nitrazine test: not done.  Pelvis: adequate for delivery.   Cervix: Cervix evaluated by digital exam.     Physical Exam  Constitutional: She is oriented to person, place, and time. She appears well-developed and well-nourished. No distress.  HENT:  Head: Normocephalic.  Eyes: Pupils are equal, round, and reactive to light.  Neck: Normal range of motion.  Cardiovascular: Normal rate, regular rhythm and intact distal pulses.  Exam reveals no gallop and no friction rub.   No murmur heard. Respiratory: Effort normal and breath sounds normal. No respiratory distress. She has no wheezes. She has no rales. She exhibits no tenderness.  GI: Soft. Bowel sounds are normal. She exhibits no distension and no mass. There is no tenderness. There is no rebound and no guarding.  Genitourinary: Vaginal discharge found.  Musculoskeletal: Normal range of motion.  Neurological: She is alert and oriented to person, place, and time. She has normal reflexes. She displays normal reflexes. No cranial nerve deficit. She exhibits normal muscle tone. Coordination normal.  Skin: Skin is warm and dry. No rash noted. She is not diaphoretic. No erythema. No pallor.  Psychiatric: She has a normal mood and affect.    Prenatal labs: ABO, Rh: --/--/A POS (02/27 1252) Antibody: Negative (03/14 0000) Rubella:  immune RPR: NON REACTIVE (03/03 2105)  HBsAg:  Negative (03/14 0000)  HIV: Non-reactive (03/14 0000)  GBS:   unknown, obtained today 2 hr glucola: 78-125-87 GC/Ch: Neg/Neg  Assessment/Plan: Labor: Admit to L&D for care of labor and delivery of baby GBS Unk: Term at 37 wks, will not treat  Records indicated desire to bottle feed and use OCP for contraception   Jennifer Barber 09/28/2010, 5:39 PM

## 2010-09-28 NOTE — Plan of Care (Signed)
Problem: Consults Goal: Birthing Suites Patient Information Press F2 to bring up selections list   Pt 37-[redacted] weeks EGA     

## 2010-09-28 NOTE — Progress Notes (Signed)
Pt states ctx's q1-2 minutes since 1245 today, denies bleeding or lof. +Fm.

## 2010-09-28 NOTE — Anesthesia Procedure Notes (Signed)
Epidural Patient location during procedure: OB Start time: 09/28/2010 7:53 PM  Staffing Anesthesiologist: Jiles Garter  Preanesthetic Checklist Completed: patient identified, site marked, surgical consent, pre-op evaluation, timeout performed, IV checked, risks and benefits discussed and monitors and equipment checked  Epidural Patient position: sitting Prep: site prepped and draped and DuraPrep Patient monitoring: continuous pulse ox and blood pressure Approach: midline Injection technique: LOR air  Needle:  Needle type: Tuohy  Needle gauge: 17 G Needle length: 9 cm Needle insertion depth: 5 cm cm Catheter type: closed end flexible Catheter size: 19 Gauge Catheter at skin depth: 10 cm Test dose: negative  Assessment Events: blood not aspirated, injection not painful, no injection resistance, negative IV test and no paresthesia  Additional Notes Scoliosis; slightly longer to place Dosing of Epidural: 1st dose, through needle...... ( mg expressed as equavilent  cc's medication  from .1%Bupiv / fentanyl syringe from L&D pump)...............  4mg  Marcaine  2nd dose, through catheter after waiting 3 minutes.... epi 1:200K + Xylocaine 40 mg 3rd dose, through catheter, after waiting 3 minutes...Marland KitchenMarland Kitchenepi 1:200K + Xylocaine 20 mg ( 2% Xylo charted as a single dose in Epic Meds for ease of charting; actual dosing was fractionated as above, for saftey's sake)  As each dose occurred, patient was free of IV sx; and patient exhibited no evidence of SA injection.  Patient is more comfortable after epidural dosed. Please see RN's note for documentation of vital signs,and FHR which are stable.

## 2010-09-28 NOTE — Anesthesia Preprocedure Evaluation (Signed)

## 2010-09-28 NOTE — Progress Notes (Signed)
Pt made aware that she cannot have Ambien due to her having an epidural and being a labor pt; Pt verbalizes understanding.

## 2010-09-28 NOTE — ED Provider Notes (Signed)
Pt admitted from MAU to birthing suites. See H&P.

## 2010-09-28 NOTE — Progress Notes (Signed)
Jennifer Barber is a 25 y.o. [phone] at [redacted]w[redacted]d by admitted for active labor  Subjective: Patient continues to complain of pain with contractions and is asking for epidural. No LOF, good fetal movement.  Objective: BP 104/56  Pulse 83  Temp(Src) 97.6 F (36.4 C) (Oral)  Resp 20  Ht 5' (1.524 m)  Wt 60.782 kg (134 lb)  BMI 26.17 kg/m2  Breastfeeding? Unknown      FHT:  FHR: 125-130 bpm, variability: moderate,  accelerations:  Present,  decelerations:  Absent UC:   regular, every 2-3 minutes SVE:   Dilation: 4 Effacement (%): 80 Station: -2 Exam by:: Dr Natale Milch  Labs: Lab Results  Component Value Date   WBC 9.3 09/28/2010   HGB 10.5* 09/28/2010   HCT 31.2* 09/28/2010   MCV 84.1 09/28/2010   PLT 178 09/28/2010    Assessment / Plan: Labor: some cervical change over the last 2 hours Fetal Wellbeing:  Category I Pain Control:  stadol. Plans for epidural I/D:  n/a GBS unknown at term Anticipated MOD:  NSVD  Jennifer Barber N 09/28/2010, 7:39 PM

## 2010-09-28 NOTE — Progress Notes (Signed)
Jennifer Barber is a 25 y.o. [phone] at [redacted]w[redacted]d admitted for active labor  Subjective: Patient has epidural now and contractions have slowed somewhat, but she still reports pain with them. Feels nauseated.  Objective: BP 112/61  Pulse 77  Temp(Src) 97.6 F (36.4 C) (Oral)  Resp 16  Ht 5' (1.524 m)  Wt 60.782 kg (134 lb)  BMI 26.17 kg/m2  SpO2 92%  Breastfeeding? Unknown      FHT:  FHR: 110-120 bpm, variability: moderate,  accelerations:  Present,  decelerations:  Absent UC:   regular, every 5 minutes SVE:   Dilation: 4 Effacement (%): 80 Station: -2 Exam by:: Dr Natale Milch (just checked by RN and reported as unchanged)  Labs: Lab Results  Component Value Date   WBC 9.3 09/28/2010   HGB 10.5* 09/28/2010   HCT 31.2* 09/28/2010   MCV 84.1 09/28/2010   PLT 178 09/28/2010    Assessment / Plan: Labor, ?protracted early phase Fetal Wellbeing:  Category I Pain Control:  Epidural I/D:  n/a (GBS unk, at term) Anticipated MOD:  NSVD  Luciano Cinquemani N 09/28/2010, 9:29 PM

## 2010-09-29 ENCOUNTER — Encounter (HOSPITAL_COMMUNITY): Payer: Self-pay | Admitting: *Deleted

## 2010-09-29 LAB — RPR: RPR Ser Ql: NONREACTIVE

## 2010-09-29 MED ORDER — TETANUS-DIPHTH-ACELL PERTUSSIS 5-2.5-18.5 LF-MCG/0.5 IM SUSP
0.5000 mL | Freq: Once | INTRAMUSCULAR | Status: AC
  Administered 2010-09-30: 0.5 mL via INTRAMUSCULAR
  Filled 2010-09-29: qty 0.5

## 2010-09-29 MED ORDER — SIMETHICONE 80 MG PO CHEW
80.0000 mg | CHEWABLE_TABLET | ORAL | Status: DC | PRN
  Administered 2010-09-30 – 2010-10-01 (×2): 80 mg via ORAL

## 2010-09-29 MED ORDER — IBUPROFEN 600 MG PO TABS
600.0000 mg | ORAL_TABLET | Freq: Four times a day (QID) | ORAL | Status: DC
  Administered 2010-09-29 – 2010-10-01 (×8): 600 mg via ORAL
  Filled 2010-09-29 (×8): qty 1

## 2010-09-29 MED ORDER — LANOLIN HYDROUS EX OINT: TOPICAL_OINTMENT | CUTANEOUS | Status: DC | PRN

## 2010-09-29 MED ORDER — ONDANSETRON HCL 4 MG/2ML IJ SOLN: 4.0000 mg | INTRAMUSCULAR | Status: DC | PRN

## 2010-09-29 MED ORDER — OXYTOCIN 20 UNITS IN LACTATED RINGERS INFUSION - SIMPLE
1.0000 m[IU]/min | INTRAVENOUS | Status: DC
  Administered 2010-09-29: 2 m[IU]/min via INTRAVENOUS
  Filled 2010-09-29: qty 1000

## 2010-09-29 MED ORDER — ZOLPIDEM TARTRATE 5 MG PO TABS
5.0000 mg | ORAL_TABLET | Freq: Every evening | ORAL | Status: DC | PRN
  Administered 2010-09-29 – 2010-09-30 (×2): 5 mg via ORAL
  Filled 2010-09-29 (×2): qty 1

## 2010-09-29 MED ORDER — TERBUTALINE SULFATE 1 MG/ML IJ SOLN
0.2500 mg | Freq: Once | INTRAMUSCULAR | Status: DC | PRN
Start: 2010-09-29 — End: 2010-09-29

## 2010-09-29 MED ORDER — BENZOCAINE-MENTHOL 20-0.5 % EX AERO: 1.0000 "application " | INHALATION_SPRAY | CUTANEOUS | Status: DC | PRN

## 2010-09-29 MED ORDER — SODIUM CHLORIDE 0.9 % IJ SOLN
3.0000 mL | INTRAMUSCULAR | Status: DC | PRN
  Administered 2010-09-29: 3 mL via INTRAVENOUS

## 2010-09-29 MED ORDER — PRENATAL PLUS 27-1 MG PO TABS
1.0000 | ORAL_TABLET | Freq: Every day | ORAL | Status: DC
  Administered 2010-09-29 – 2010-10-01 (×3): 1 via ORAL
  Filled 2010-09-29 (×3): qty 1

## 2010-09-29 MED ORDER — DIPHENHYDRAMINE HCL 25 MG PO CAPS: 25.0000 mg | ORAL_CAPSULE | Freq: Four times a day (QID) | ORAL | Status: DC | PRN

## 2010-09-29 MED ORDER — OXYCODONE-ACETAMINOPHEN 5-325 MG PO TABS
1.0000 | ORAL_TABLET | ORAL | Status: DC | PRN
  Administered 2010-09-29 – 2010-10-01 (×9): 1 via ORAL
  Filled 2010-09-29 (×9): qty 1

## 2010-09-29 MED ORDER — WITCH HAZEL-GLYCERIN EX PADS: 1.0000 "application " | MEDICATED_PAD | CUTANEOUS | Status: DC | PRN

## 2010-09-29 MED ORDER — SENNOSIDES-DOCUSATE SODIUM 8.6-50 MG PO TABS
2.0000 | ORAL_TABLET | Freq: Every day | ORAL | Status: DC
  Administered 2010-09-29 – 2010-09-30 (×2): 2 via ORAL

## 2010-09-29 MED ORDER — DIBUCAINE 1 % RE OINT: 1.0000 "application " | TOPICAL_OINTMENT | RECTAL | Status: DC | PRN

## 2010-09-29 MED ORDER — ONDANSETRON HCL 4 MG PO TABS: 4.0000 mg | ORAL_TABLET | ORAL | Status: DC | PRN

## 2010-09-29 NOTE — Progress Notes (Signed)
Jennifer Barber is a 25 y.o. [id number] at [redacted]w[redacted]d by LMP admitted for active labor  Subjective: Comfortable, can feel contractions.  States contracting every 1-2 minutes.  Feeling some pressure.  Objective: BP 96/56  Pulse 67  Temp(Src) 98 F (36.7 C) (Oral)  Resp 18  Ht 5' (1.524 m)  Wt 134 lb (60.782 kg)  BMI 26.17 kg/m2  SpO2 92%  Breastfeeding? Unknown      FHT:  FHR: 130 bpm, variability: moderate,  accelerations:  Abscent,  decelerations:  Absent UC:   Uterine irritability on monitor. SVE:   Dilation: 6 Effacement (%): 80 Station: -2 Exam by:: dr. booth  Labs: Lab Results  Component Value Date   WBC 9.3 09/28/2010   HGB 10.5* 09/28/2010   HCT 31.2* 09/28/2010   MCV 84.1 09/28/2010   PLT 178 09/28/2010    Assessment / Plan: spontaneous labor, protracted active phase  Labor: will start pitocin Preeclampsia:  n/a Fetal Wellbeing:  Category I Pain Control:  Epidural I/D:  GBS unk, no indications to start Abx Anticipated MOD:  NSVD  BOOTH, Ponce Skillman 09/29/2010, 9:10 AM

## 2010-09-29 NOTE — Progress Notes (Signed)
   Jennifer Barber is a 25 y.o. [id number] at [redacted]w[redacted]d by LMP admitted for active labor  Subjective:   Objective: BP 102/59  Pulse 72  Temp(Src) 98 F (36.7 C) (Oral)  Resp 18  Ht 5' (1.524 m)  Wt 134 lb (60.782 kg)  BMI 26.17 kg/m2  SpO2 92%  Breastfeeding? Unknown    FHT:  FHR: 120 bpm, variability: moderate,  accelerations:  Present,  decelerations:  Absent UC:   regular, every 5 minutes SVE:   Dilation: 5 Effacement (%): 50 Station: -2 Exam by:: fran c-dishmon cnm  Labs: Lab Results  Component Value Date   WBC 9.3 09/28/2010   HGB 10.5* 09/28/2010   HCT 31.2* 09/28/2010   MCV 84.1 09/28/2010   PLT 178 09/28/2010    Assessment / Plan: Protracted latent phase  Labor: protracted labor Fetal Wellbeing:  Category I Pain Control:  Epidural Anticipated MOD:  NSVD  [name],Sabella Traore 09/29/2010, 10:48 AM

## 2010-09-29 NOTE — Progress Notes (Signed)
Jennifer Barber is a 25 y.o. [id number] at [redacted]w[redacted]d admitted for active labor  Subjective: Patient had asked for [facility]; request denied.  Objective: BP 91/40  Pulse 77  Temp(Src) 97.6 F (36.4 C) (Oral)  Resp 18  Ht 5' (1.524 m)  Wt 60.782 kg (134 lb)  BMI 26.17 kg/m2  SpO2 92%  Breastfeeding? Unknown      FHT:  FHR: 115 bpm, variability: moderate,  accelerations:  Present,  decelerations:  Absent and   UC:   Poorly tracing at this time SVE:   Dilation: 5 Effacement (%): 80 Station: -1 Exam by:: some bulging membranes palpated, head felt to be engaged; by dr Natale Milch  Labs: Lab Results  Component Value Date   WBC 9.3 09/28/2010   HGB 10.5* 09/28/2010   HCT 31.2* 09/28/2010   MCV 84.1 09/28/2010   PLT 178 09/28/2010    Assessment / Plan: Spontaneous labor, progressing normally Will cont to monitor closely. Fetal Wellbeing:  Category I Pain Control:  Epidural I/D:  n/a Anticipated MOD:  NSVD  Shai Mckenzie N 09/29/2010, 12:20 AM

## 2010-09-29 NOTE — Plan of Care (Signed)
Problem: Consults Goal: Postpartum Patient Education (See Patient Education module for education specifics.)  Outcome: Progressing Breast care and how to dry breast milk explained to patient.Patient has on a sport bra now but was told this was not tight enough she needs a smaller size  Problem: Phase I Progression Outcomes Goal: Pain controlled with appropriate interventions Outcome: Completed/Met Date Met:  09/29/10 Patient gets good control on Percocet and Motrin.She also uses ice to her perineum Goal: OOB as tolerated unless otherwise ordered Outcome: Completed/Met Date Met:  09/29/10 Patient has already been down to NICU  Problem: Discharge Progression Outcomes Goal: Barriers To Progression Addressed/Resolved Outcome: Progressing Patient's daughter is in NICU so she does have some concerns about her infants well being

## 2010-09-29 NOTE — Progress Notes (Signed)
Jennifer Barber is a 25 y.o. [phone] at [redacted]w[redacted]d by ultrasound admitted for active labor  Subjective:   Objective: BP 100/42  Pulse 65  Temp(Src) 98.2 F (36.8 C) (Oral)  Resp 16  Ht 5' (1.524 m)  Wt 60.782 kg (134 lb)  BMI 26.17 kg/m2  SpO2 92%  Breastfeeding? Unknown      FHT:  FHR: 135-140 bpm, variability: moderate,  accelerations:  Present,  decelerations:  Absent UC:   irregular, every 1-5 minutes SVE:   Dilation: 5 Effacement (%): 80 Station: -1 Exam by:: some bulging membranes palpated, head felt to be engaged; by dr Natale Milch  Labs: Lab Results  Component Value Date   WBC 9.3 09/28/2010   HGB 10.5* 09/28/2010   HCT 31.2* 09/28/2010   MCV 84.1 09/28/2010   PLT 178 09/28/2010    Assessment / Plan: Spontaneous labor, progressing normally; will check cervix at next evaluation Fetal Wellbeing:  Category I Pain Control:  Epidural I/D:  n/a Anticipated MOD:  NSVD  Jennifer Barber N 09/29/2010, 2:57 AM

## 2010-09-29 NOTE — Progress Notes (Signed)
Jennifer Barber is a 25 y.o. [phone] at [redacted]w[redacted]d admitted for active labor  Subjective: C/O Back pain and being unable to sleep  Objective: BP 92/40  Pulse 68  Temp(Src) 98.2 F (36.8 C) (Oral)  Resp 16  Ht 5' (1.524 m)  Wt 60.782 kg (134 lb)  BMI 26.17 kg/m2  SpO2 92%  Breastfeeding? Unknown      FHT:  FHR: 115-120 bpm, variability: moderate,  accelerations:  Present,  decelerations:  Absent UC:   Not tracing well, every 1-3 minutes when they trace well SVE:   Dilation: 6.5 Effacement (%): 80 Station: -1 Exam by:: Dr. Natale Milch  Labs: Lab Results  Component Value Date   WBC 9.3 09/28/2010   HGB 10.5* 09/28/2010   HCT 31.2* 09/28/2010   MCV 84.1 09/28/2010   PLT 178 09/28/2010    Assessment / Plan: Spontaneous labor, progressing normally  Labor: Progressing normally Fetal Wellbeing:  Category I Pain Control:  Epidural I/D:  n/a Anticipated MOD:  NSVD  Brayam Boeke N 09/29/2010, 6:06 AM

## 2010-09-30 ENCOUNTER — Encounter (HOSPITAL_COMMUNITY): Payer: Self-pay | Admitting: *Deleted

## 2010-09-30 MED ORDER — CITALOPRAM HYDROBROMIDE 20 MG PO TABS
20.0000 mg | ORAL_TABLET | Freq: Every day | ORAL | Status: DC
  Administered 2010-09-30 – 2010-10-01 (×2): 20 mg via ORAL
  Filled 2010-09-30 (×3): qty 1

## 2010-09-30 MED ORDER — ALPRAZOLAM 0.5 MG PO TABS
1.0000 mg | ORAL_TABLET | Freq: Three times a day (TID) | ORAL | Status: DC | PRN
  Administered 2010-09-30: 1 mg via ORAL
  Filled 2010-09-30: qty 2

## 2010-09-30 NOTE — Progress Notes (Signed)
Post Partum Day 1 Subjective: no complaints, up ad lib, voiding and tolerating PO  Objective: Blood pressure 103/54, pulse 65, temperature 97.8 F (36.6 C), temperature source Oral, resp. rate 16, height 5' (1.524 m), weight 60.782 kg (134 lb), SpO2 98.00%, unknown if currently breastfeeding.  Physical Exam:  General: alert and cooperative Lochia: appropriate Uterine Fundus: firm Incision: n/a DVT Evaluation: No evidence of DVT seen on physical exam.   Basename 09/28/10 1859  HGB 10.5*  HCT 31.2*    Assessment/Plan: Plan for discharge tomorrow   LOS: 2 days   Dayrin Stallone C. 09/30/2010, 7:29 AM

## 2010-09-30 NOTE — Progress Notes (Signed)
Patient had a panic attack like symptoms today around 1700. Jerrell Mylar, RN cared for her and notified Dr. Despina Hidden who gave orders. Patient took Xanax per order (see MAR). Kim walked around the unit with her, talked to her calmly, and calmed her down. At 1810, I went in pt's room to give her meds and she stated she "felt better and felt relaxed".

## 2010-10-01 LAB — URINALYSIS, ROUTINE W REFLEX MICROSCOPIC
Bilirubin Urine: NEGATIVE
Glucose, UA: NEGATIVE
Hgb urine dipstick: NEGATIVE
Ketones, ur: NEGATIVE
Nitrite: NEGATIVE
Protein, ur: NEGATIVE
Specific Gravity, Urine: 1.019
Urobilinogen, UA: 1
pH: 7

## 2010-10-01 LAB — BASIC METABOLIC PANEL
BUN: 6
CO2: 20
Calcium: 8.7
Chloride: 111
Creatinine, Ser: 0.76
GFR calc Af Amer: >60
GFR calc non Af Amer: >60
Glucose, Bld: 106 — ABNORMAL HIGH
Potassium: 4.1
Sodium: 138

## 2010-10-01 LAB — POCT PREGNANCY, URINE
Operator id: 294341
Preg Test, Ur: NEGATIVE

## 2010-10-01 LAB — CBC
HCT: 39.8
Hemoglobin: 13.4
MCHC: 33.6
MCV: 84
Platelets: 266
RBC: 4.74
RDW: 13.4
WBC: 14.4 — ABNORMAL HIGH

## 2010-10-01 LAB — CULTURE, BETA STREP (GROUP B ONLY)

## 2010-10-01 MED ORDER — IBUPROFEN 600 MG PO TABS: 600.0000 mg | ORAL_TABLET | Freq: Four times a day (QID) | ORAL | Status: DC

## 2010-10-01 MED ORDER — DIPHENOXYLATE-ATROPINE 2.5-0.025 MG PO TABS
1.0000 | ORAL_TABLET | ORAL | Status: DC | PRN
  Administered 2010-10-01: 1 via ORAL
  Filled 2010-10-01: qty 1

## 2010-10-01 MED ORDER — DIPHENOXYLATE-ATROPINE 2.5-0.025 MG PO TABS: 1.0000 | ORAL_TABLET | ORAL | Status: AC | PRN

## 2010-10-01 MED ORDER — CYCLOBENZAPRINE HCL 10 MG PO TABS
10.0000 mg | ORAL_TABLET | Freq: Three times a day (TID) | ORAL | Status: DC | PRN
  Administered 2010-10-01: 10 mg via ORAL
  Filled 2010-10-01: qty 1

## 2010-10-01 MED ORDER — CYCLOBENZAPRINE HCL 10 MG PO TABS: 10.0000 mg | ORAL_TABLET | Freq: Three times a day (TID) | ORAL | Status: AC | PRN

## 2010-10-01 MED ORDER — FERROUS SULFATE 325 (65 FE) MG PO TABS: 325.0000 mg | ORAL_TABLET | Freq: Three times a day (TID) | ORAL | Status: DC

## 2010-10-01 MED ORDER — DOCUSATE SODIUM 100 MG PO CAPS: 100.0000 mg | ORAL_CAPSULE | Freq: Two times a day (BID) | ORAL | Status: DC | PRN

## 2010-10-01 MED ORDER — OXYCODONE-ACETAMINOPHEN 5-325 MG PO TABS: 1.0000 | ORAL_TABLET | Freq: Four times a day (QID) | ORAL | Status: DC | PRN

## 2010-10-01 NOTE — Progress Notes (Signed)
Spiritual Care - Provided support to NICU mom.  Patient discussed her stress and fears of her baby being in NICU.  The father of the baby is incarcerated and she was stressed having gone through this experience alone.  She has visited with baby in NICU and reports being comfortable to touch her and be with her.  She has one other child whose father is deceased.  We discussed talking again prior to her discharge today.  Dory Horn

## 2010-10-01 NOTE — Progress Notes (Signed)
  Called to bedside for back/epigastric pain.  Per nursing report, pt complained of chest pain.  On further questioning, the pain was located epigastric and was radiating from her back.  S: pt states having back pain that radiates around her rib cage to the epigastric region.  Massage, positioning, and heat pad help the pain.  Also complaining of watery diarrhea x3 episodes today with some nausea without emesis.   O: afebrile MSK: paraspinous muscles tender to palpation, no erythema or edema appreciated A/P: PPD#2 with muscle spasms and diarrhea.  She is afebrile and able to tolerate PO. -will give flexeril for muscle spasms -will lomotil for diarrhea -discharge home as planned -instructed patient that she can go to the MAU if her pain or diarrhea gets worse.  BOOTH, Channing Yeager 10/01/2010, 3:55 PM

## 2010-10-01 NOTE — Discharge Summary (Signed)
Obstetric Discharge Summary Reason for Admission: onset of labor Prenatal Procedures: ultrasound Intrapartum Procedures: spontaneous vaginal delivery Postpartum Procedures: none Complications-Operative and Postpartum: none Hemoglobin  Date Value Range Status  09/28/2010 10.5* 12.0-15.0 (g/dL) Final     HCT  Date Value Range Status  09/28/2010 31.2* 36.0-46.0 (%) Final    Discharge Diagnoses: Term Pregnancy-delivered  Discharge Information: Date: 10/01/2010 Activity: pelvic rest Diet: routine Medications: PNV, Ibuprofen, Colace, Iron and Percocet Condition: stable Instructions: refer to practice specific booklet Discharge to: home Follow-up Information    Follow up with [facility]. Call today. (for 4-6 week PP visit)          Newborn Data: Live born female  Birth Weight: 5 lb 0.4 oz (2280 g) APGAR: 6, 7  Home with mother.  Jennifer Barber, Jennifer Barber 10/01/2010, 9:03 AM

## 2010-10-01 NOTE — Progress Notes (Signed)
Pt discharged... Rooming in down in the NICU... Condition stable.Marland KitchenMarland KitchenNo equipment... Escorted to NICU via wheelchair with Tilda Franco, RN and Mel Almond, [place].

## 2010-10-01 NOTE — Anesthesia Postprocedure Evaluation (Signed)
Patient stable following vaginal delivery.  

## 2010-10-01 NOTE — Progress Notes (Signed)
RN called to room d/t pt c/o chest pains.  Upon arrival, pt stated chest did not hurt, but pain was in upper abdomen referring around under ribcage to back.  Stated it felt like someone was "squeezing around my middle".  VSS.    Palpation revealed hypertonic erector spinae muscles in lower thoracic and lumbar regions, worse on right side.  Pt reported massage lessoned pain briefly.  Left CVA tenderness with percussion.  Pt reports having had 3 watery BMs today.  Reports no difficulty or pain with urination.  Pt intending to room-in with baby in NICU tonight in anticipation of infant discharge tomorrow.  States will not be able to get prescriptions filled until tomorrow when her parents pick her up.  Phoned Dr. Elwyn Reach with update.  MD to come reassess pt.

## 2010-10-01 NOTE — Progress Notes (Signed)
  Post Partum Day 2 Subjective: no complaints, up ad lib, voiding, tolerating PO and + flatus, thin lochia, present BM, present flatus, plans to bottle feed, nexplanon  Objective: Blood pressure 105/66, pulse 85, temperature 98.4 F (36.9 C), temperature source Oral, resp. rate 16, height 5' (1.524 m), weight 134 lb (60.782 kg), SpO2 98.00%, unknown if currently breastfeeding.  Physical Exam:  General: alert, cooperative, appears stated age and no distress Lochia: appropriate Abdomen: +BS, soft, nontender,  Uterine Fundus: firm DVT Evaluation: No evidence of DVT seen on physical exam. Negative Homan's sign. No significant calf/ankle edema. Extremities:    Basename 09/28/10 1859  HGB 10.5*  HCT 31.2*    Assessment/Plan: Discharge home and Contraception nexplanon at [facility] visit   LOS: 3 days   BOOTH, Millie Forde 10/01/2010, 8:58 AM

## 2010-10-01 NOTE — Progress Notes (Signed)
PSYCHOSOCIAL ASSESSMENT ~ MATERNAL/CHILD Name: Jennifer Barber                                                                                        Age:   Referral Date: 10/01/10   Reason/Source: SW met with patient to introduce myself, as she initially met with weekend SW.    I. FAMILY/HOME ENVIRONMENT A. Child's Legal Guardian __x_Parent(s) ___Grandparent ___Foster parent ___DSS_________________ Name: Jennifer Barber                         DOB: 03/29/1985                     Age: 25         Address: [address] [place] [address]  Name: Jennifer Barber                       DOB: //                     Age:   Address: FOB in jail  B. Other Household Members/Support Persons Name: Jennifer (7)                       Relationship: sister              DOB ___/___/___                   Name: Jennifer "AJ" (2)                       Relationship: brother           DOB ___/___/___                   Name:                                         Relationship:                        DOB ___/___/___                   Name:                                         Relationship:                        DOB ___/___/___  C. Other Support: MGM, MGF, FOB   II. PSYCHOSOCIAL DATA A. Information Source                                                                                                   _x_Patient Interview  __Family Interview           __Other___________  B. Financial and Community Resources __Employment: _x_Medicaid    County: [place]                __Private Insurance:                   __Self Pay  _x_Food Stamps   _x_WIC __[employer]     __Public Housing     __Section 8    __Maternity Care Coordination/Child Service Coordination/Early Intervention  __School:                                                                         Grade:  __Other:   C. Cultural and Environment Information Cultural Issues Impacting Care: none known  III. STRENGTHS ___Supportive  family/friends _x__Adequate Resources _x__Compliance with medical plan ___Home prepared for Child (including basic supplies) _x__Understanding of illness      ___Other: IV. RISK FACTORS AND CURRENT PROBLEMS         ____No Problems Noted                                                                                                                                                                                                                                                Pt              Family          Substance Abuse                                                                   ___              ___             Mental Illness                                                                          _x__              ___  Family/Relationship Issues                                      _x__               ___             Abuse/Neglect/Domestic Violence                                         ___         ___  Financial Resources                                        ___              ___             Transportation                                                                        _x__               ___  DSS Involvement                                                                   _x__              ___  Adjustment to Illness                                                               ___              ___  Knowledge/Cognitive Deficit                                                   ___              ___             Compliance with Treatment                                                 ___                ___  Basic Needs (food, housing, etc.)                                          ___              ___             Housing Concerns                                       ___              ___ Other: FOB (main support person) in jail            V. SOCIAL WORK ASSESSMENT SW met with MOB in her third floor room to introduce myself, as she initially met with weekend SW.  She opened right up to SW  and we talked for quite some time.  She stated many stressors to SW, but appears to be coping well with everything, including baby's admission to NICU.  She seems to have a good understanding of why baby is in the NICU.  Her main concern at this time is transportation home today and to visit baby in NICU.  SW discussed options such as gas cards and the [facility].  SW asked her about her support system.  She told SW that her parents, who adopted her when she was 12, are very good supports, but they cannot help her with transportation because her mother is legally blind and her father has cancer and cannot drive.She states she wouldn't be in this position if her boyfriend was not in jail.  She states he used to take her everywhere she needed to go.  SW discussed why he is in jail and ensured that it was not due to a violent act against her.  She confirmed that it was not.  She states they are together and have been together since her 2 year old son was little.  She states that that child's father was murdered when she was 6 months pregnant.  This clearly still affects her and SW asked if she received/receives any grief counseling.  She said no, but thinks it would be a good idea and gave SW permission to contact Hospice to see if counseling can be arranged.  She is worried about transportation to counseling, but wants to inquire.  SW contacted [facility] and spoke to Chaplain Redd.  SW gave contact information and Ms. Redd will contact her at the beginning of next week once she gets home from the hospital and settled with her new baby.  SW informed Ms. Redd that transportation is an issue for MOB and she states they will go to her home if that would be best for MOB.  SW informed MOB of this.  SW gave MGM's phone number as a contact number because MOB states her phone is not turned on at this time.  MOB told SW that she has applied for an assurance wireless phone, but does not know the status of  her application.  SW tried to check the status, but MOB does not know her application number because her paperwork is at home.  SW instructed her to call SW back if she finds it and wants SW to check on her application.    She was very appreciative.  SW asked if she has what she needs for baby at home and she said she has a car seat and clothes, but nothing else.  SW offered to get her items from [facility] [employer] and she agreed.  SW got items, including a pack n play from [employer] and gave them to MOB.  MOB was so appreciative she began to cry.  In conversation, MOB mentioned to SW that she had involvement with CPS with her 7 year old daughter.  She states the schools reported her for inappropriate discipline.  She states Melissa was her worker at CPS and that her case was unsubstantiated and closed.  SW called [place] [employer] to verify.  CPS intake worker verified that there is not an open case on MOB.  SW received update on the baby and plan for discharge and was told by bedside RN/Ardis that baby and MOB can room in tonight with tomorrow as an anticipated discharge for baby.  RN states SW can discuss this with MOB.  SW told MOB and she was extremely happy.  SW gave her a gas card to give to a friend or family member to come pick her and baby up tomorrow.  She thinks she will be able to arrange a ride since she has the gas card.  She was very thankful for this as well.  MOB told SW that she has a diagnosis of Schizophrenia and was very open about this.  She states she takes Celexa and Ambien and her symptoms are well controlled.  She states that she was receiving counseling, but that she had to stop due to issues with transportation, but plans to resume if she can work out how to get there.  She states she will definitely be able to resume counseling when FOB gets out of jail, but may consider taking Medicaid transportation again if she thinks she really needs to go.  She states it is hard to  arrange Medicaid transportation since she doesn't always have a working phone.  MOB was extremely pleasant and seemed to enjoy talking with SW.  SW thinks although she has many stressors in her life, she is coping well and has supports in place.    VI. SOCIAL WORK PLAN  ___No Further Intervention Required/No Barriers to Discharge   _x__Psychosocial Support and Ongoing Assessment of Needs   ___Patient/Family Education:   ___Child Protective Services Report   County___________ Date___/____/____   ___Information/Referral to Community Resources_________________________   _x__Other: [facility]/[employer] 

## 2010-10-02 ENCOUNTER — Inpatient Hospital Stay (HOSPITAL_COMMUNITY)
Admission: AD | Admit: 2010-10-02 | Discharge: 2010-10-02 | Disposition: A | Discharge status: Home / Self Care | Payer: Medicaid Other | Source: Ambulatory Visit | Attending: Obstetrics & Gynecology | Admitting: Obstetrics & Gynecology

## 2010-10-02 ENCOUNTER — Encounter (HOSPITAL_COMMUNITY): Payer: Self-pay | Admitting: *Deleted

## 2010-10-02 DIAGNOSIS — O99891 Other specified diseases and conditions complicating pregnancy: Secondary | ICD-10-CM | POA: Insufficient documentation

## 2010-10-02 DIAGNOSIS — R109 Unspecified abdominal pain: Secondary | ICD-10-CM | POA: Insufficient documentation

## 2010-10-02 DIAGNOSIS — K529 Noninfective gastroenteritis and colitis, unspecified: Secondary | ICD-10-CM

## 2010-10-02 DIAGNOSIS — K5289 Other specified noninfective gastroenteritis and colitis: Secondary | ICD-10-CM | POA: Insufficient documentation

## 2010-10-02 HISTORY — DX: Sickle-cell trait: D57.3

## 2010-10-02 LAB — DIFFERENTIAL
Basophils Absolute: 0 10*3/uL (ref 0.0–0.1)
Basophils Relative: 0 % (ref 0–1)
Eosinophils Absolute: 0.1 10*3/uL (ref 0.0–0.7)
Eosinophils Relative: 2 % (ref 0–5)
Lymphocytes Relative: 19 % (ref 12–46)
Lymphs Abs: 0.9 10*3/uL (ref 0.7–4.0)
Monocytes Absolute: 0.3 10*3/uL (ref 0.1–1.0)
Monocytes Relative: 6 % (ref 3–12)
Neutro Abs: 3.4 10*3/uL (ref 1.7–7.7)
Neutrophils Relative %: 74 % (ref 43–77)

## 2010-10-02 LAB — URINALYSIS, ROUTINE W REFLEX MICROSCOPIC
Bilirubin Urine: NEGATIVE
Glucose, UA: NEGATIVE mg/dL
Ketones, ur: 40 mg/dL — AB
Leukocytes, UA: NEGATIVE
Nitrite: NEGATIVE
Protein, ur: NEGATIVE mg/dL
Specific Gravity, Urine: 1.015 (ref 1.005–1.030)
Urobilinogen, UA: 0.2 mg/dL (ref 0.0–1.0)
pH: 6 (ref 5.0–8.0)

## 2010-10-02 LAB — CBC
HCT: 37.4 % (ref 36.0–46.0)
Hemoglobin: 12.8 g/dL (ref 12.0–15.0)
MCH: 28.6 pg (ref 26.0–34.0)
MCHC: 34.2 g/dL (ref 30.0–36.0)
MCV: 83.7 fL (ref 78.0–100.0)
Platelets: 185 10*3/uL (ref 150–400)
RBC: 4.47 MIL/uL (ref 3.87–5.11)
RDW: 13 % (ref 11.5–15.5)
WBC: 4.6 10*3/uL (ref 4.0–10.5)

## 2010-10-02 LAB — URINE MICROSCOPIC-ADD ON

## 2010-10-02 NOTE — Progress Notes (Signed)
Pt rooming in with infant in NICU, states she doesn't "feel right", has low back pain, intermittant, sharp, abd pain, and a lack of appetite.  Staes she received the DTap vaccine yesterday.

## 2010-10-02 NOTE — ED Provider Notes (Signed)
History     No chief complaint on file.  HPI This is 25 year old female G4 P3 013 who delivered via vaginal delivery 3 days ago on 09/29/2010. She was released from the hospital yesterday. She presents to the MAU with nonradiating cramping abdominal pain in the left upper quadrant that began yesterday.. She currently rates her pain at about 6/10. She does have mild nausea and lack of appetite. Just described watery diarrhea that started yesterday. She denies fevers, chills, vomiting, shortness of breath, palpitations, chest pain. She has no recent sick contacts but recently you had a Tdap upon discharge. She denies illegal drug use. She has had no changes to her medications recently. She does have mild right low back pain. She is currently rooming in with her baby who is in the NICU.    Past Medical History  Diagnosis Date  . Abnormal Pap smear   . Anemia   . Mental disorder     bipolar, schizophrenia  . Bipolar 1 disorder   . Schizophrenia   . Blood dyscrasia     Sickle Cell Trait  . Scoliosis   . Sickle cell trait     Past Surgical History  Procedure Date  . Laparoscopic cholecystectomy     during second pregnancy  . Femoral osteotomy w/ rodding 2004    s/p accident , leg fx    Family History  Problem Relation Age of Onset  . Cancer Mother   . Kidney disease Mother   . Alcohol abuse Mother   . Cancer Maternal Grandmother     History  Substance Use Topics  . Smoking status: Never Smoker   . Smokeless tobacco: Never Used  . Alcohol Use: No    Allergies:  Allergies  Allergen Reactions  . Codeine     Prescriptions prior to admission  Medication Sig Dispense Refill  . citalopram (CELEXA) 20 MG tablet Take 20 mg by mouth daily.       . cyclobenzaprine (FLEXERIL) 10 MG tablet Take 1 tablet (10 mg total) by mouth 3 (three) times daily as needed for muscle spasms.  30 tablet  0  . diphenoxylate-atropine (LOMOTIL) 2.5-0.025 MG per tablet Take 1 tablet by mouth as  needed for diarrhea/loose stools.  15 tablet  0  . docusate sodium (COLACE) 100 MG capsule Take 1 capsule (100 mg total) by mouth 2 (two) times daily as needed for constipation.  30 capsule  3  . ferrous sulfate (FERROUSUL) 325 (65 FE) MG tablet Take 1 tablet (325 mg total) by mouth 3 (three) times daily with meals.  30 tablet  3  . prenatal vitamin w/FE, FA (PRENATAL 1 + 1) 27-1 MG TABS Take 1 tablet by mouth daily.       Marland Kitchen zolpidem (AMBIEN) 10 MG tablet Take 5 mg by mouth at bedtime as needed. sleep      . ibuprofen (ADVIL,MOTRIN) 600 MG tablet Take 1 tablet (600 mg total) by mouth every 6 (six) hours.  30 tablet  0  . oxyCODONE-acetaminophen (PERCOCET) 5-325 MG per tablet Take 1 tablet by mouth every 6 (six) hours as needed (moderate - severe pain).  15 tablet  0    Review of Systems  All other systems reviewed and are negative.   Physical Exam   Blood pressure 86/43, pulse 74, temperature 98.4 F (36.9 C), temperature source Oral, resp. rate 16, not currently breastfeeding.  Physical Exam  Constitutional: She appears well-developed and well-nourished.  HENT:  Head: Normocephalic  and atraumatic.  Eyes: Pupils are equal, round, and reactive to light.  Cardiovascular: Normal rate and regular rhythm.  Exam reveals no gallop and no friction rub.   No murmur heard. Respiratory: Effort normal and breath sounds normal. No respiratory distress. She has no wheezes. She has no rales. She exhibits no tenderness.  GI: Soft. Bowel sounds are normal. She exhibits no distension.       Left upper quadrant tenderness. No rebound guarding or masses palpated.   Lab Results  Component Value Date   WBC 4.6 10/02/2010   HGB 12.8 10/02/2010   HCT 37.4 10/02/2010   MCV 83.7 10/02/2010   PLT 185 10/02/2010   UA - negative nitrites, leukocytes. MAU Course  Procedures  Assessment and Plan  1.  Gastroenteritis - Counseled patient to eat bland diet - small amounts frequently with sips of fluids.   Discussed self-limiting nature of illness.  Patient discharged to home.   Amand Lemoine JEHIEL 10/02/2010, 2:24 AM

## 2010-10-04 LAB — BASIC METABOLIC PANEL
BUN: 6
CO2: 25
Calcium: 9.3
Chloride: 104
Creatinine, Ser: 0.71
GFR calc Af Amer: >60
GFR calc non Af Amer: >60
Glucose, Bld: 95
Potassium: 3.6
Sodium: 134 — ABNORMAL LOW

## 2010-10-04 LAB — URINALYSIS, ROUTINE W REFLEX MICROSCOPIC
Bilirubin Urine: NEGATIVE
Glucose, UA: NEGATIVE
Hgb urine dipstick: NEGATIVE
Ketones, ur: 15 — AB
Nitrite: NEGATIVE
Protein, ur: NEGATIVE
Specific Gravity, Urine: >1.03 — ABNORMAL HIGH
Urobilinogen, UA: 0.2
pH: 6

## 2010-10-04 LAB — ABO/RH: ABO/RH(D): A POS

## 2010-10-04 LAB — DIFFERENTIAL
Basophils Absolute: 0.1
Basophils Relative: 1
Eosinophils Absolute: 0.2
Eosinophils Relative: 2
Lymphocytes Relative: 23
Lymphs Abs: 2.4
Monocytes Absolute: 0.6
Monocytes Relative: 6
Neutro Abs: 6.9
Neutrophils Relative %: 68

## 2010-10-04 LAB — CBC
HCT: 38.9
Hemoglobin: 13.4
MCHC: 34.3
MCV: 84.3
Platelets: 281
RBC: 4.62
RDW: 13.4
WBC: 10.2

## 2010-10-04 LAB — PREGNANCY, URINE: Preg Test, Ur: POSITIVE

## 2010-10-04 LAB — HCG, QUANTITATIVE, PREGNANCY: hCG, Beta Chain, Quant, S: 100541 — ABNORMAL HIGH

## 2010-11-08 ENCOUNTER — Encounter (HOSPITAL_COMMUNITY): Payer: Self-pay | Admitting: *Deleted

## 2010-11-08 ENCOUNTER — Emergency Department (HOSPITAL_COMMUNITY)
Admission: EM | Admit: 2010-11-08 | Discharge: 2010-11-08 | Disposition: A | Discharge status: Home / Self Care | Payer: Medicaid Other | Attending: Emergency Medicine | Admitting: Emergency Medicine

## 2010-11-08 DIAGNOSIS — F209 Schizophrenia, unspecified: Secondary | ICD-10-CM

## 2010-11-08 DIAGNOSIS — D573 Sickle-cell trait: Secondary | ICD-10-CM | POA: Insufficient documentation

## 2010-11-08 DIAGNOSIS — F17200 Nicotine dependence, unspecified, uncomplicated: Secondary | ICD-10-CM | POA: Insufficient documentation

## 2010-11-08 DIAGNOSIS — F319 Bipolar disorder, unspecified: Secondary | ICD-10-CM | POA: Insufficient documentation

## 2010-11-08 DIAGNOSIS — M4120 Other idiopathic scoliosis, site unspecified: Secondary | ICD-10-CM | POA: Insufficient documentation

## 2010-11-08 LAB — BASIC METABOLIC PANEL
BUN: 6 mg/dL (ref 6–23)
CO2: 28 mEq/L (ref 19–32)
Calcium: 9.4 mg/dL (ref 8.4–10.5)
Chloride: 102 mEq/L (ref 96–112)
Creatinine, Ser: 1.1 mg/dL (ref 0.50–1.10)
GFR calc Af Amer: 80 mL/min — ABNORMAL LOW (ref >90)
GFR calc non Af Amer: 69 mL/min — ABNORMAL LOW (ref >90)
Glucose, Bld: 90 mg/dL (ref 70–99)
Potassium: 3.6 mEq/L (ref 3.5–5.1)
Sodium: 138 mEq/L (ref 135–145)

## 2010-11-08 LAB — CBC
HCT: 41.6 % (ref 36.0–46.0)
Hemoglobin: 13.6 g/dL (ref 12.0–15.0)
MCH: 27 pg (ref 26.0–34.0)
MCHC: 32.7 g/dL (ref 30.0–36.0)
MCV: 82.7 fL (ref 78.0–100.0)
Platelets: 226 10*3/uL (ref 150–400)
RBC: 5.03 MIL/uL (ref 3.87–5.11)
RDW: 13 % (ref 11.5–15.5)
WBC: 8 10*3/uL (ref 4.0–10.5)

## 2010-11-08 LAB — DIFFERENTIAL
Basophils Absolute: 0 10*3/uL (ref 0.0–0.1)
Basophils Relative: 1 % (ref 0–1)
Eosinophils Absolute: 0.2 10*3/uL (ref 0.0–0.7)
Eosinophils Relative: 3 % (ref 0–5)
Lymphocytes Relative: 45 % (ref 12–46)
Lymphs Abs: 3.6 10*3/uL (ref 0.7–4.0)
Monocytes Absolute: 0.4 10*3/uL (ref 0.1–1.0)
Monocytes Relative: 5 % (ref 3–12)
Neutro Abs: 3.7 10*3/uL (ref 1.7–7.7)
Neutrophils Relative %: 47 % (ref 43–77)

## 2010-11-08 LAB — RAPID URINE DRUG SCREEN, HOSP PERFORMED
Amphetamines: NOT DETECTED
Barbiturates: NOT DETECTED
Benzodiazepines: NOT DETECTED
Cocaine: POSITIVE — AB
Opiates: NOT DETECTED
Tetrahydrocannabinol: POSITIVE — AB

## 2010-11-08 LAB — ETHANOL: Alcohol, Ethyl (B): <11 mg/dL (ref 0–11)

## 2010-11-08 NOTE — ED Notes (Signed)
Pt reports she has been dx with schizopherinia, pt reports she forgets to take her meds, pt recently gave birth in sept and has been more depressed, today pt reports she told her home health nurse that she has been hearing voices telling her to "just go ahead and do it", pt reports she does not want to hurt herself and wants help pt here voluntary at this time

## 2010-11-08 NOTE — ED Notes (Signed)
Pt pruse in bag with belongings phone and charger in coat pocket

## 2010-11-08 NOTE — ED Notes (Signed)
Pt left the er stating no needs 

## 2010-11-08 NOTE — ED Provider Notes (Signed)
History     CSN: [id number] Arrival date & time: 11/08/2010  8:36 PM   First MD Initiated Contact with Patient 11/08/10 2037      Chief Complaint  Patient presents with  . Medical Clearance    (Consider location/radiation/quality/duration/timing/severity/associated sxs/prior treatment) The history is provided by the patient.   Patient here with auditory hallucinations telling her to commit suicide without a definitive plan. History of schizophrenia has been off her medications due to a recent childbirth one month ago. Denies any ingestions at this time. No homicidal ideations, does use marijuana. Nothing makes her symptoms better however she has been more depressed recently. When the patient isn't hallucinating she denies having any suicidal thoughts Past Medical History  Diagnosis Date  . Abnormal Pap smear   . Anemia   . Mental disorder     bipolar, schizophrenia  . Bipolar 1 disorder   . Schizophrenia   . Blood dyscrasia     Sickle Cell Trait  . Scoliosis   . Sickle cell trait     Past Surgical History  Procedure Date  . Laparoscopic cholecystectomy     during second pregnancy  . Femoral osteotomy w/ rodding 2004    s/p accident , leg fx    Family History  Problem Relation Age of Onset  . Cancer Mother   . Kidney disease Mother   . Alcohol abuse Mother   . Cancer Maternal Grandmother     History  Substance Use Topics  . Smoking status: Current Everyday Smoker  . Smokeless tobacco: Never Used  . Alcohol Use: Yes    OB History    Grav Para Term Preterm Abortions TAB SAB Ect Mult Living   4 3 3  0 1 0 1 0 0 3      Review of Systems  All other systems reviewed and are negative.    Allergies  Codeine  Home Medications   Current Outpatient Rx  Name Route Sig Dispense Refill  . ABILIFY PO Oral Take by mouth.      Marland Kitchen CITALOPRAM HYDROBROMIDE 20 MG PO TABS Oral Take 20 mg by mouth daily.     Marland Kitchen FERROUS SULFATE 325 (65 FE) MG PO TABS Oral Take 1 tablet  (325 mg total) by mouth 3 (three) times daily with meals. 30 tablet 3  . ZOLPIDEM TARTRATE 10 MG PO TABS Oral Take 5 mg by mouth at bedtime as needed. sleep    . PRENATAL PLUS 27-1 MG PO TABS Oral Take 1 tablet by mouth daily.       BP 123/89  Pulse 89  Temp(Src) 98.2 F (36.8 C) (Oral)  Resp 20  Ht 5' (1.524 m)  Wt 123 lb (55.792 kg)  BMI 24.02 kg/m2  SpO2 96%  Physical Exam  Nursing note and vitals reviewed. Constitutional: She is oriented to person, place, and time. Vital signs are normal. She appears well-developed and well-nourished.  Non-toxic appearance. No distress.  HENT:  Head: Normocephalic and atraumatic.  Eyes: Conjunctivae and EOM are normal. Pupils are equal, round, and reactive to light.  Neck: Normal range of motion. Neck supple. No tracheal deviation present.  Cardiovascular: Normal rate, regular rhythm and normal heart sounds.  Exam reveals no gallop.   No murmur heard. Pulmonary/Chest: Effort normal and breath sounds normal. No stridor. No respiratory distress. She has no wheezes.  Abdominal: Soft. Normal appearance and bowel sounds are normal. She exhibits no distension. There is no tenderness. There is no rebound.  Musculoskeletal: Normal  range of motion. She exhibits no edema and no tenderness.  Neurological: She is alert and oriented to person, place, and time. She has normal strength. No cranial nerve deficit or sensory deficit. GCS eye subscore is 4. GCS verbal subscore is 5. GCS motor subscore is 6.  Skin: Skin is warm and dry.  Psychiatric: Her speech is normal and behavior is normal. Her mood appears not anxious. Her affect is blunt. Thought content is paranoid. She exhibits a depressed mood.    ED Course  Procedures (including critical care time)   Labs Reviewed  ETHANOL  URINE RAPID DRUG SCREEN (HOSP PERFORMED)  CBC  DIFFERENTIAL  BASIC METABOLIC PANEL  POCT PREGNANCY, URINE   No results found.   No diagnosis found.    MDM  Patient is  medically cleared will be seen by act team      Patient was seen by the ACT.  No suicidal or homicidal ideations are identified.  Patient's be discharged with contract for safety and instructions to return if her symptoms worsen.  Nelia Shi, MD 11/08/10 2325

## 2010-11-08 NOTE — ED Notes (Signed)
Jennifer Barber with ACT team here

## 2010-11-08 NOTE — ED Notes (Signed)
Patient talking on cell phone per act team member, calm and laughing to person on phone, crying at times, saying that she wants to go home, feels like she is in jail. Very polite to me at this time.

## 2010-11-08 NOTE — ED Notes (Signed)
[name] , ACT in with pt

## 2011-05-21 ENCOUNTER — Emergency Department (HOSPITAL_COMMUNITY)
Admission: EM | Admit: 2011-05-21 | Discharge: 2011-05-21 | Disposition: A | Discharge status: Home / Self Care | Payer: Medicaid Other | Attending: Emergency Medicine | Admitting: Emergency Medicine

## 2011-05-21 ENCOUNTER — Encounter (HOSPITAL_COMMUNITY): Payer: Self-pay | Admitting: *Deleted

## 2011-05-21 DIAGNOSIS — R451 Restlessness and agitation: Secondary | ICD-10-CM

## 2011-05-21 DIAGNOSIS — Z046 Encounter for general psychiatric examination, requested by authority: Secondary | ICD-10-CM | POA: Insufficient documentation

## 2011-05-21 DIAGNOSIS — F319 Bipolar disorder, unspecified: Secondary | ICD-10-CM | POA: Insufficient documentation

## 2011-05-21 DIAGNOSIS — IMO0002 Reserved for concepts with insufficient information to code with codable children: Secondary | ICD-10-CM | POA: Insufficient documentation

## 2011-05-21 LAB — RAPID URINE DRUG SCREEN, HOSP PERFORMED
Amphetamines: NOT DETECTED
Barbiturates: NOT DETECTED
Benzodiazepines: NOT DETECTED
Cocaine: NOT DETECTED
Opiates: NOT DETECTED
Tetrahydrocannabinol: POSITIVE — AB

## 2011-05-21 LAB — CBC
HCT: 42.3 % (ref 36.0–46.0)
Hemoglobin: 14.3 g/dL (ref 12.0–15.0)
MCH: 27.2 pg (ref 26.0–34.0)
MCHC: 33.8 g/dL (ref 30.0–36.0)
MCV: 80.6 fL (ref 78.0–100.0)
Platelets: 208 10*3/uL (ref 150–400)
RBC: 5.25 MIL/uL — ABNORMAL HIGH (ref 3.87–5.11)
RDW: 12.9 % (ref 11.5–15.5)
WBC: 5.4 10*3/uL (ref 4.0–10.5)

## 2011-05-21 LAB — URINALYSIS, ROUTINE W REFLEX MICROSCOPIC
Glucose, UA: NEGATIVE mg/dL
Ketones, ur: NEGATIVE mg/dL
Leukocytes, UA: NEGATIVE
Nitrite: NEGATIVE
Protein, ur: 30 mg/dL — AB
Specific Gravity, Urine: >1.03 — ABNORMAL HIGH (ref 1.005–1.030)
Urobilinogen, UA: 0.2 mg/dL (ref 0.0–1.0)
pH: 6 (ref 5.0–8.0)

## 2011-05-21 LAB — BASIC METABOLIC PANEL
BUN: 11 mg/dL (ref 6–23)
CO2: 25 mEq/L (ref 19–32)
Calcium: 10 mg/dL (ref 8.4–10.5)
Chloride: 103 mEq/L (ref 96–112)
Creatinine, Ser: 0.99 mg/dL (ref 0.50–1.10)
GFR calc Af Amer: 90 mL/min — ABNORMAL LOW (ref >90)
GFR calc non Af Amer: 77 mL/min — ABNORMAL LOW (ref >90)
Glucose, Bld: 102 mg/dL — ABNORMAL HIGH (ref 70–99)
Potassium: 3.6 mEq/L (ref 3.5–5.1)
Sodium: 139 mEq/L (ref 135–145)

## 2011-05-21 LAB — DIFFERENTIAL
Basophils Absolute: 0.1 10*3/uL (ref 0.0–0.1)
Basophils Relative: 2 % — ABNORMAL HIGH (ref 0–1)
Eosinophils Absolute: 0.1 10*3/uL (ref 0.0–0.7)
Eosinophils Relative: 2 % (ref 0–5)
Lymphocytes Relative: 50 % — ABNORMAL HIGH (ref 12–46)
Lymphs Abs: 2.7 10*3/uL (ref 0.7–4.0)
Monocytes Absolute: 0.5 10*3/uL (ref 0.1–1.0)
Monocytes Relative: 9 % (ref 3–12)
Neutro Abs: 2 10*3/uL (ref 1.7–7.7)
Neutrophils Relative %: 37 % — ABNORMAL LOW (ref 43–77)

## 2011-05-21 LAB — ETHANOL: Alcohol, Ethyl (B): <11 mg/dL (ref 0–11)

## 2011-05-21 LAB — URINE MICROSCOPIC-ADD ON

## 2011-05-21 LAB — PREGNANCY, URINE: Preg Test, Ur: NEGATIVE

## 2011-05-21 MED ORDER — IBUPROFEN 800 MG PO TABS
800.0000 mg | ORAL_TABLET | Freq: Once | ORAL | Status: AC
  Administered 2011-05-21: 800 mg via ORAL
  Filled 2011-05-21: qty 1

## 2011-05-21 NOTE — ED Provider Notes (Signed)
History  Scribed for Geoffery Lyons, MD, the patient was seen in room APA03/APA03. This chart was scribed by Candelaria Stagers. The patient's care started at 4:12 PM    CSN: [id number]  Arrival date & time 05/21/11  1543   First MD Initiated Contact with Patient 05/21/11 1558      Chief Complaint  Patient presents with  . V70.1     The history is provided by the patient and the police.   Jennifer Barber is a 26 y.o. female who presents to the Emergency Department brought in by police after social services were checking on pts children and requested the police to go with them.  Pt began screaming and would not communicate further.  Pt states she does not know what happed and that she acted out after the police showed up at her house.  She states she thought her ss case was closed and did not know why they were checking on her children.  She denies suicidal thoughts.  She reports marijauna use but denies other drugs or alcohol.  She has a h/o bipolar, schizophrenia, and anxiety and states that she takes medication regularly.      Past Medical History  Diagnosis Date  . Abnormal Pap smear   . Anemia   . Mental disorder     bipolar, schizophrenia  . Bipolar 1 disorder   . Schizophrenia   . Blood dyscrasia     Sickle Cell Trait  . Scoliosis   . Sickle cell trait     Past Surgical History  Procedure Date  . Laparoscopic cholecystectomy     during second pregnancy  . Femoral osteotomy w/ rodding 2004    s/p accident , leg fx    Family History  Problem Relation Age of Onset  . Cancer Mother   . Kidney disease Mother   . Alcohol abuse Mother   . Cancer Maternal Grandmother     History  Substance Use Topics  . Smoking status: Current Everyday Smoker  . Smokeless tobacco: Never Used  . Alcohol Use: Yes    OB History    Grav Para Term Preterm Abortions TAB SAB Ect Mult Living   4 3 3  0 1 0 1 0 0 3      Review of Systems  Psychiatric/Behavioral: Negative for suicidal  ideas and self-injury. The patient is nervous/anxious.   All other systems reviewed and are negative.    Allergies  Bee venom; Latex; and Codeine  Home Medications   Current Outpatient Rx  Name Route Sig Dispense Refill  . ABILIFY PO Oral Take by mouth.      Marland Kitchen CITALOPRAM HYDROBROMIDE 20 MG PO TABS Oral Take 20 mg by mouth daily.     Marland Kitchen FERROUS SULFATE 325 (65 FE) MG PO TABS Oral Take 1 tablet (325 mg total) by mouth 3 (three) times daily with meals. 30 tablet 3  . PRENATAL PLUS 27-1 MG PO TABS Oral Take 1 tablet by mouth daily.     Marland Kitchen ZOLPIDEM TARTRATE 10 MG PO TABS Oral Take 5 mg by mouth at bedtime as needed. sleep      BP 109/70  Pulse 92  Temp(Src) 98 F (36.7 C) (Oral)  Resp 20  SpO2 100%  Physical Exam  Nursing note and vitals reviewed. Constitutional: She is oriented to person, place, and time. She appears well-developed and well-nourished.       Pt is teary  HENT:  Head: Normocephalic and atraumatic.  Eyes: Conjunctivae  are normal. Pupils are equal, round, and reactive to light.  Neck: Normal range of motion. Neck supple.  Cardiovascular: Normal rate and regular rhythm.   Pulmonary/Chest: Effort normal and breath sounds normal.  Abdominal: Soft. There is no tenderness.  Musculoskeletal: Normal range of motion. She exhibits no edema.  Neurological: She is alert and oriented to person, place, and time.  Skin: Skin is warm and dry. She is not diaphoretic.    ED Course  Procedures   DIAGNOSTIC STUDIES: Oxygen Saturation is 100% on room air, normal by my interpretation.    COORDINATION OF CARE:  4:20PM Ordered: CBC ; Differential ; Basic metabolic panel ; Drug screen panel, emergency ; Ethanol ; Pregnancy, urine ; Urinalysis, Routine w reflex microscopic ; Consult to call act team    Labs Reviewed  CBC - Abnormal; Notable for the following:    RBC 5.25 (*)    All other components within normal limits  DIFFERENTIAL - Abnormal; Notable for the following:     Neutrophils Relative 37 (*)    Lymphocytes Relative 50 (*)    Basophils Relative 2 (*)    All other components within normal limits  BASIC METABOLIC PANEL - Abnormal; Notable for the following:    Glucose, Bld 102 (*)    GFR calc non Af Amer 77 (*)    GFR calc Af Amer 90 (*)    All other components within normal limits  URINE RAPID DRUG SCREEN (HOSP PERFORMED) - Abnormal; Notable for the following:    Tetrahydrocannabinol POSITIVE (*)    All other components within normal limits  URINALYSIS, ROUTINE W REFLEX MICROSCOPIC - Abnormal; Notable for the following:    Color, Urine AMBER (*) BIOCHEMICALS MAY BE AFFECTED BY COLOR   Specific Gravity, Urine >1.030 (*)    Hgb urine dipstick MODERATE (*)    Bilirubin Urine SMALL (*)    Protein, ur 30 (*)    All other components within normal limits  URINE MICROSCOPIC-ADD ON - Abnormal; Notable for the following:    Squamous Epithelial / LPF MANY (*)    Bacteria, UA MANY (*)    Casts HYALINE CASTS (*)    All other components within normal limits  ETHANOL  PREGNANCY, URINE   No results found.   No diagnosis found.    MDM  The patient was seen by myself and act and both of [place] believe that she is not actively psychotic, homicidal, or suicidal.  She acted out during a stressful time, but does not appear to require hospitalization.  She will be discharged to home, to return prn if she has problems.   I personally performed the services described in this documentation, which was scribed in my presence. The recorded information has been reviewed and considered.            Geoffery Lyons, MD 05/21/11 [phone]
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
## 2011-05-21 NOTE — ED Notes (Signed)
Act team in room with patient at this time.

## 2011-05-21 NOTE — ED Notes (Signed)
Dr DeLo at bedside. 
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
## 2011-05-21 NOTE — ED Notes (Signed)
Patient ambulatory to restroom with steady gait.
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
## 2011-05-21 NOTE — ED Notes (Signed)
Patient provided dinner tray

## 2011-05-21 NOTE — ED Notes (Signed)
Refuses to answer questions about being suicidal or homicidal

## 2011-05-21 NOTE — Discharge Instructions (Signed)
 RESOURCE GUIDE  Dental Problems  Patients with Medicaid: [facility]                     [facility] [address]                                           [address] Phone:  [phone]                                                  Phone:  [phone]  If unable to pay or uninsured, contact:  [facility] or [place] [facility] to become qualified for the adult dental clinic.  Chronic Pain Problems Contact [facility]  [phone] Patients need to be referred by their primary care doctor.  Insufficient Money for Medicine Contact United Way:  call "211" or [facility] 271-5999.  No Primary Care Doctor Call [facility]  [phone] Other agencies that provide inexpensive medical care    [facility]  [phone]    [facility]  [phone]    [facility]  [phone]    Women's Clinic  [phone]    [facility]  [phone]    [facility]  [phone]  Psychological Services [facility]  [phone] [facility]  [phone] [facility]   800 853-5163 (emergency services [phone])  Substance Abuse Resources [facility]  336-882-2125 [facility] 336-784-9470 [facility] 336-285-9073 [facility] 336-845-3988 Residential & Outpatient Substance Abuse Program  800-659-3381  Abuse/Neglect [place] Child Abuse Hotline (336) 641-3795 [place] Child Abuse Hotline 800-378-5315 (After Hours)  Emergency Shelter [facility] (336) 271-5985  Maternity Homes [facility] (336) 275-9566 [facility] (704) 372-4663  MRSA Hotline #:   832-7006    [place] Resources  [facility] [place]     United Way                          [facility] [address] [place]                       [address]      [address]  [place]                                                [place]                            [place] Phone:  [phone]                                   Phone:  [phone]                 Phone:  [phone]  [facility] Phone:    [phone]  [place] Child Abuse Hotline (336) 342-1394 (336) 342-3537 (After Hours)   

## 2011-05-21 NOTE — ED Notes (Signed)
Patient waiting for officer to come transport her home.

## 2011-06-20 ENCOUNTER — Emergency Department (HOSPITAL_COMMUNITY)
Admission: EM | Admit: 2011-06-20 | Discharge: 2011-06-21 | Disposition: A | Discharge status: Home / Self Care | Payer: Self-pay | Attending: Emergency Medicine | Admitting: Emergency Medicine

## 2011-06-20 ENCOUNTER — Encounter (HOSPITAL_COMMUNITY): Payer: Self-pay | Admitting: *Deleted

## 2011-06-20 DIAGNOSIS — F319 Bipolar disorder, unspecified: Secondary | ICD-10-CM | POA: Insufficient documentation

## 2011-06-20 DIAGNOSIS — B9689 Other specified bacterial agents as the cause of diseases classified elsewhere: Secondary | ICD-10-CM | POA: Insufficient documentation

## 2011-06-20 DIAGNOSIS — A499 Bacterial infection, unspecified: Secondary | ICD-10-CM | POA: Insufficient documentation

## 2011-06-20 DIAGNOSIS — N760 Acute vaginitis: Secondary | ICD-10-CM | POA: Insufficient documentation

## 2011-06-20 DIAGNOSIS — A6000 Herpesviral infection of urogenital system, unspecified: Secondary | ICD-10-CM | POA: Insufficient documentation

## 2011-06-20 DIAGNOSIS — Z8659 Personal history of other mental and behavioral disorders: Secondary | ICD-10-CM | POA: Insufficient documentation

## 2011-06-20 LAB — WET PREP, GENITAL
Trich, Wet Prep: NONE SEEN
Yeast Wet Prep HPF POC: NONE SEEN

## 2011-06-20 LAB — URINALYSIS, ROUTINE W REFLEX MICROSCOPIC
Bilirubin Urine: NEGATIVE
Glucose, UA: NEGATIVE mg/dL
Hgb urine dipstick: NEGATIVE
Ketones, ur: NEGATIVE mg/dL
Leukocytes, UA: NEGATIVE
Nitrite: NEGATIVE
Protein, ur: NEGATIVE mg/dL
Specific Gravity, Urine: >1.03 — ABNORMAL HIGH (ref 1.005–1.030)
Urobilinogen, UA: 0.2 mg/dL (ref 0.0–1.0)
pH: 6 (ref 5.0–8.0)

## 2011-06-20 LAB — PREGNANCY, URINE: Preg Test, Ur: NEGATIVE

## 2011-06-20 MED ORDER — AZITHROMYCIN 250 MG PO TABS
1000.0000 mg | ORAL_TABLET | Freq: Once | ORAL | Status: AC
  Administered 2011-06-20: 1000 mg via ORAL
  Filled 2011-06-20: qty 4

## 2011-06-20 MED ORDER — LIDOCAINE HCL 2 % EX GEL: Freq: Once | CUTANEOUS | Status: DC

## 2011-06-20 MED ORDER — CEFTRIAXONE SODIUM 250 MG IJ SOLR
250.0000 mg | Freq: Once | INTRAMUSCULAR | Status: AC
  Administered 2011-06-20: 250 mg via INTRAMUSCULAR
  Filled 2011-06-20: qty 250

## 2011-06-20 MED ORDER — HYDROCODONE-ACETAMINOPHEN 5-325 MG PO TABS: ORAL_TABLET | ORAL | Status: AC

## 2011-06-20 MED ORDER — ACYCLOVIR 400 MG PO TABS: 400.0000 mg | ORAL_TABLET | Freq: Three times a day (TID) | ORAL | Status: AC

## 2011-06-20 MED ORDER — HYDROCODONE-ACETAMINOPHEN 5-325 MG PO TABS
1.0000 | ORAL_TABLET | Freq: Once | ORAL | Status: AC
  Administered 2011-06-20: 1 via ORAL
  Filled 2011-06-20: qty 1

## 2011-06-20 MED ORDER — ACYCLOVIR 800 MG PO TABS
400.0000 mg | ORAL_TABLET | Freq: Once | ORAL | Status: AC
  Administered 2011-06-20: 800 mg via ORAL
  Filled 2011-06-20: qty 1

## 2011-06-20 MED ORDER — METRONIDAZOLE 500 MG PO TABS: 500.0000 mg | ORAL_TABLET | Freq: Two times a day (BID) | ORAL | Status: AC

## 2011-06-20 NOTE — ED Notes (Signed)
States she has sore in vaginal area x 2 days, states she is schizophrenic and did not take her meds today, therefore she is very emotional

## 2011-06-20 NOTE — Discharge Instructions (Signed)
RESOURCE GUIDE  Chronic Pain Problems: Contact [facility]  [phone] Patients need to be referred by their primary care doctor.  Insufficient Money for Medicine: Contact United Way:  call "211" or [facility] 271-5999.  No Primary Care Doctor: - Call [facility]  832-8000 - can help you locate a primary care doctor that  accepts your insurance, provides certain services, etc. - Physician Referral Service- 1-800-533-3463  Agencies that provide inexpensive medical care: - [facility]  [phone] - [facility]  [phone] - [facility]  [phone] - [facility] Clinic  832-4777 - [facility]  [phone] - [facility]  [phone]  Medicaid-accepting [place] Providers: - [facility]- [address]  [phone], Mon-Fri 9am-7pm, Sat 9am-1pm - [facility]- [address], [address]  [phone] - [facility]- [address], [address]  [phone] - [facility]- [address] [place] [address]  [phone] - Veita Bland- [address], [phone]  Only accepts [place] Access Medicaid patients after they have their name  applied to their card  Self Pay (no insurance) in [place]: - Sickle Cell Patients: Dr Eric Dean, [facility]  [address], [phone] - [facility]- [address]  [phone]       -     [facility] [place]- [address]       -     [facility]- see information above (Speak to Pam H if you do not have insurance)       -  [facility]- [address], [phone]       -  [facility] [place]- [address],  [phone]       -  [facility]- [address] [place] [address], [phone]       -  Dr Osei-Bonsu-  [address], [place], [phone]       -  [facility]- [address], [phone]       -  [facility]- [address] [place] [address], [phone], also [address], [phone]       -    [facility]- [address], [phone], 1st & 3rd Saturday   every month, 10am-1pm  1) Find a Doctor and Pay Out of Pocket Although you won't have to find out who is covered by your insurance plan, it is a good idea to ask around and get recommendations. You will then need to call the office and see if the doctor you have chosen will accept you as a new patient and what types of options they offer for patients who are self-pay. Some doctors offer discounts or will set up payment plans for their patients who do not have insurance, but you will need to ask so you aren't surprised when you get to your appointment.  2) Contact Your Local Health Department Not all health departments have doctors that can see patients for sick visits, but many do, so it is worth a call to see if yours does. If you don't know where your local health department is, you can check in your phone book. The CDC also has a tool to help you locate your state's health department, and many state websites also have   listings of all of their local health departments.  3) Find a Walk-in Clinic If your illness is not likely to be very severe or complicated, you may want to try a walk in clinic. These are popping up all over the country in pharmacies, drugstores, and shopping centers. They're usually staffed by nurse practitioners or physician assistants that have been trained to treat common illnesses and complaints. They're usually fairly quick and inexpensive. However, if you have serious medical issues or chronic medical problems, these are probably not your best option  STD Testing - [place] [facility] [place], [facility] Clinic, [address], [place], phone [phone] or 1-877-539-9860.  Monday - Friday, call for an appointment. - [place]  [facility] [place], STD Clinic, [address], [place], phone [phone] or 1-877-539-9860.  Monday - Friday, call for an appointment.  Abuse/Neglect: - [place] Child Abuse Hotline (336) 641-3795 - [place] Child Abuse Hotline 800-378-5315 (After Hours)  Emergency Shelter:  [facility] (336) 271-5985  Maternity Homes: - [facility] (336) 275-9566 - [facility] (704) 372-4663  MRSA Hotline #:   832-7006  [place] Resources  [facility] [place]  [employer] [place] [facility] [address]                 [address]         [address]  [place]                                               [place]                              [place] Phone:  [phone]                                  Phone:  [phone]                   Phone:  [phone]  [place] [facility], [phone] - [place] Services - [facility]- 1-888-581-9988       -     [facility] in [place], [address],                                  336-349-4454, Insurance  [place] Child Abuse Hotline (336) 342-1394 or (336) 342-3537 (After Hours)   Behavioral Health Services  Substance Abuse Resources: - [facility]  336-882-2125 - [facility] 336-784-9470 - [facility] 336-285-9073 - [facility] 336-845-3988 - Residential & Outpatient Substance Abuse Program  800-659-3381  Psychological Services: -  Health  832-9600 - [facility]  [phone] - [facility], [address], [place], ACCESS LINE: 1-800-853-5163 or 336-641-4981, Http://www.[facility].com/services/adult.htm  Dental Assistance  If unable to pay or uninsured, contact:  [facility] or [place] [facility] to become qualified for the adult dental  clinic.  Patients with Medicaid:  [facility] [address], [phone], [phone]  If unable   to pay, or uninsured, contact [facility] 857-753-2011) or [facility] Department [phone] in [place], [phone] in [facility]) to become qualified for the adult dental clinic  Other Low-Cost Community Dental Services: - [facility]- [address] [place], [place], [place], [address], [phone], [address], 2nd and 4th Thursday of the month at 6:30am.  10 clients each day by appointment, can sometimes see walk-in patients if someone does not show for an appointment. [facility]- [address] [place], [place], [address], [phone] - [facility]- [address], [place], [place], [address], [phone] [facility] Health Department- [phone] [facility] Health Department- [phone] [facility] [phone]     Take the prescriptions as directed.  Use the topical lidocaine gel:  Apply topically and sparingly to affected area every 4 hours as needed for pain for the next 7 days, wash the area with soap and water before re-applying.  Your gonorrhea and chlamydia culture is pending results, and you will receive a phone call in the next several days if it is positive.  However, you were treated empirically today with antibiotics for both gonorrhea and chlamydia.  Call your regular OB/GYN doctor tomorrow morning to schedule a follow up appointment within the next week.  Return to the Emergency Department immediately if worsening.

## 2011-06-20 NOTE — ED Provider Notes (Signed)
History   This chart was scribed for Laray Anger, DO by Brooks Sailors. The patient was seen in room APA15/[facility]15. Patient's care was started at 2027.   CSN: [id number]  Arrival date & time 06/20/11  2027   First MD Initiated Contact with Patient 06/20/11 2058      Chief Complaint  Patient presents with  . Vaginal Pain    HPI  Pt seen 2135. Per pt, c/o gradual onset and persistence of constant vaginal area "pain" and "rash" that began 2 days ago.  Describes the area as "sore," and "burning."  Has been associated with "some itching." Denies abd pain, no flank pain, no N/V/D, no fevers.    Past Medical History  Diagnosis Date  . Abnormal Pap smear   . Anemia   . Mental disorder     bipolar, schizophrenia  . Bipolar 1 disorder   . Schizophrenia   . Blood dyscrasia     Sickle Cell Trait  . Scoliosis   . Sickle cell trait     Past Surgical History  Procedure Date  . Laparoscopic cholecystectomy     during second pregnancy  . Femoral osteotomy w/ rodding 2004    s/p accident , leg fx    Family History  Problem Relation Age of Onset  . Cancer Mother   . Kidney disease Mother   . Alcohol abuse Mother   . Cancer Maternal Grandmother     History  Substance Use Topics  . Smoking status: Current Everyday Smoker  . Smokeless tobacco: Never Used  . Alcohol Use: Yes    OB History    Grav Para Term Preterm Abortions TAB SAB Ect Mult Living   4 3 3  0 1 0 1 0 0 3      Review of Systems ROS: Statement: All systems negative except as marked or noted in the HPI; Constitutional: Negative for fever and chills. ; ; Eyes: Negative for eye pain, redness and discharge. ; ; ENMT: Negative for ear pain, hoarseness, nasal congestion, sinus pressure and sore throat. ; ; Cardiovascular: Negative for chest pain, palpitations, diaphoresis, dyspnea and peripheral edema. ; ; Respiratory: Negative for cough, wheezing and stridor. ; ; Gastrointestinal: Negative for nausea, vomiting,  diarrhea, abdominal pain, blood in stool, hematemesis, jaundice and rectal bleeding. . ; ; Genitourinary: Negative for dysuria, flank pain and hematuria. ; GYN:  No vaginal bleeding, no vaginal discharge, +vulvar pain and "lesions" ; Musculoskeletal: Negative for back pain and neck pain. Negative for swelling and trauma.; ; Skin: Negative for abrasions, bruising and +skin lesion.; ; Neuro: Negative for headache, lightheadedness and neck stiffness. Negative for weakness, altered level of consciousness , altered mental status, extremity weakness, paresthesias, involuntary movement, seizure and syncope.      Allergies  Bee venom; Codeine; and Latex  Home Medications   Current Outpatient Rx  Name Route Sig Dispense Refill  . CITALOPRAM HYDROBROMIDE 20 MG PO TABS Oral Take 20 mg by mouth daily.     Marland Kitchen FERROUS SULFATE 325 (65 FE) MG PO TABS Oral Take 1 tablet (325 mg total) by mouth 3 (three) times daily with meals. 30 tablet 3  . ZOLPIDEM TARTRATE 10 MG PO TABS Oral Take 5 mg by mouth at bedtime as needed. sleep      BP 121/90  Pulse 109  Temp 98.5 F (36.9 C) (Oral)  Resp 24  Ht 5' (1.524 m)  Wt 140 lb (63.504 kg)  BMI 27.34 kg/m2  SpO2 100%  Physical Exam 2135: Physical examination:  Nursing notes reviewed; Vital signs and O2 SAT reviewed;  Constitutional: Well developed, Well nourished, Well hydrated, Uncomfortable and tearful; Head:  Normocephalic, atraumatic; Eyes: EOMI, PERRL, No scleral icterus; ENMT: Mouth and pharynx normal, Mucous membranes moist; Neck: Supple, Full range of motion, No lymphadenopathy; Cardiovascular: Regular rate and rhythm, No murmur, rub, or gallop; Respiratory: Breath sounds clear & equal bilaterally, No rales, rhonchi, wheezes.  Speaking full sentences with ease, Normal respiratory effort/excursion; Chest: Nontender, Movement normal; Abdomen: Soft, Nontender, Nondistended, Normal bowel sounds; Genitourinary: Pelvic exam performed with permission of pt and female  ED tech assist during exam.  External genitalia with multiple tender shallow ulcerated lesions and few small intact vesicles with mild surrounding erythema, no edema, no purulent drainage. Vaginal vault with thick green discharge.  Cervix w/o lesions, not friable, GC/chlam and wet prep obtained and sent to lab.  Bimanual exam w/o CMT, uterine or adnexal tenderness.;; Extremities: Pulses normal, No tenderness, No edema, No calf edema or asymmetry.; Neuro: AA&Ox3, Major CN grossly intact.  Speech clear. No gross focal motor or sensory deficits in extremities.; Skin: Color normal, Warm, Dry.   ED Course  Procedures    MDM  MDM Reviewed: nursing note, vitals and previous chart Interpretation: labs     Results for orders placed during the hospital encounter of 06/20/11  PREGNANCY, URINE      Component Value Range   Preg Test, Ur NEGATIVE  NEGATIVE  URINALYSIS, ROUTINE W REFLEX MICROSCOPIC      Component Value Range   Color, Urine AMBER (*) YELLOW   APPearance CLEAR  CLEAR   Specific Gravity, Urine >1.030 (*) 1.005 - 1.030   pH 6.0  5.0 - 8.0   Glucose, UA NEGATIVE  NEGATIVE mg/dL   Hgb urine dipstick NEGATIVE  NEGATIVE   Bilirubin Urine NEGATIVE  NEGATIVE   Ketones, ur NEGATIVE  NEGATIVE mg/dL   Protein, ur NEGATIVE  NEGATIVE mg/dL   Urobilinogen, UA 0.2  0.0 - 1.0 mg/dL   Nitrite NEGATIVE  NEGATIVE   Leukocytes, UA NEGATIVE  NEGATIVE  WET PREP, GENITAL      Component Value Range   Yeast Wet Prep HPF POC NONE SEEN  NONE SEEN   Trich, Wet Prep NONE SEEN  NONE SEEN   Clue Cells Wet Prep HPF POC TOO NUMEROUS TO COUNT (*) NONE SEEN   WBC, Wet Prep HPF POC MANY (*) NONE SEEN      11:07 PM:  Lesions on perineal area and labia appear herpetic.  Will dose topical lidocaine gel here for pain and give first dose of acyclovir.  +BV and many WBC's on wet prep.  Will empirically tx for STD, while GC/chlam pending.  Dx testing d/w pt.  Questions answered.  Verb understanding, agreeable to d/c  home with outpt f/u.        I personally performed the services described in this documentation, which was scribed in my presence. The recorded information has been reviewed and considered. Tericka Devincenzi Allison Quarry, DO 06/23/11 1639

## 2011-06-20 NOTE — ED Notes (Signed)
Pt states burning with urination and pressure

## 2011-06-21 NOTE — ED Notes (Signed)
Pt stable at discharge Pt instructed not to drive while on pain medication 

## 2011-06-22 ENCOUNTER — Emergency Department (HOSPITAL_COMMUNITY)
Admission: EM | Admit: 2011-06-22 | Discharge: 2011-06-22 | Disposition: A | Discharge status: Home / Self Care | Payer: Self-pay | Attending: Emergency Medicine | Admitting: Emergency Medicine

## 2011-06-22 ENCOUNTER — Encounter (HOSPITAL_COMMUNITY): Payer: Self-pay | Admitting: Emergency Medicine

## 2011-06-22 DIAGNOSIS — F319 Bipolar disorder, unspecified: Secondary | ICD-10-CM | POA: Insufficient documentation

## 2011-06-22 DIAGNOSIS — N760 Acute vaginitis: Secondary | ICD-10-CM

## 2011-06-22 DIAGNOSIS — N898 Other specified noninflammatory disorders of vagina: Secondary | ICD-10-CM | POA: Insufficient documentation

## 2011-06-22 DIAGNOSIS — F17200 Nicotine dependence, unspecified, uncomplicated: Secondary | ICD-10-CM | POA: Insufficient documentation

## 2011-06-22 DIAGNOSIS — D573 Sickle-cell trait: Secondary | ICD-10-CM | POA: Insufficient documentation

## 2011-06-22 DIAGNOSIS — A6000 Herpesviral infection of urogenital system, unspecified: Secondary | ICD-10-CM

## 2011-06-22 DIAGNOSIS — D649 Anemia, unspecified: Secondary | ICD-10-CM | POA: Insufficient documentation

## 2011-06-22 DIAGNOSIS — R300 Dysuria: Secondary | ICD-10-CM | POA: Insufficient documentation

## 2011-06-22 DIAGNOSIS — B9689 Other specified bacterial agents as the cause of diseases classified elsewhere: Secondary | ICD-10-CM

## 2011-06-22 DIAGNOSIS — M4120 Other idiopathic scoliosis, site unspecified: Secondary | ICD-10-CM | POA: Insufficient documentation

## 2011-06-22 LAB — URINALYSIS, ROUTINE W REFLEX MICROSCOPIC
Bilirubin Urine: NEGATIVE
Glucose, UA: NEGATIVE mg/dL
Leukocytes, UA: NEGATIVE
Nitrite: NEGATIVE
Protein, ur: NEGATIVE mg/dL
Specific Gravity, Urine: 1.025 (ref 1.005–1.030)
Urobilinogen, UA: 0.2 mg/dL (ref 0.0–1.0)
pH: 6 (ref 5.0–8.0)

## 2011-06-22 LAB — PREGNANCY, URINE: Preg Test, Ur: NEGATIVE

## 2011-06-22 LAB — URINE MICROSCOPIC-ADD ON

## 2011-06-22 MED ORDER — HYDROCODONE-ACETAMINOPHEN 5-325 MG PO TABS
1.0000 | ORAL_TABLET | Freq: Once | ORAL | Status: AC
  Administered 2011-06-22: 1 via ORAL
  Filled 2011-06-22: qty 1

## 2011-06-22 MED ORDER — LIDOCAINE HCL 2 % EX GEL
Freq: Once | CUTANEOUS | Status: AC
  Administered 2011-06-22: 10:00:00 via TOPICAL
  Filled 2011-06-22 (×2): qty 10

## 2011-06-22 MED ORDER — FLUCONAZOLE 100 MG PO TABS
150.0000 mg | ORAL_TABLET | Freq: Once | ORAL | Status: AC
  Administered 2011-06-22: 150 mg via ORAL
  Filled 2011-06-22: qty 2

## 2011-06-22 MED ORDER — ACYCLOVIR 800 MG PO TABS
400.0000 mg | ORAL_TABLET | Freq: Once | ORAL | Status: AC
  Administered 2011-06-22: 400 mg via ORAL
  Filled 2011-06-22: qty 2

## 2011-06-22 MED ORDER — METRONIDAZOLE 500 MG PO TABS
500.0000 mg | ORAL_TABLET | Freq: Once | ORAL | Status: AC
  Administered 2011-06-22: 500 mg via ORAL
  Filled 2011-06-22: qty 1

## 2011-06-22 NOTE — ED Provider Notes (Signed)
History     CSN: [id number]  Arrival date & time 06/22/11  0905   First MD Initiated Contact with Patient 06/22/11 [phone]      Chief Complaint  Patient presents with  . Vaginal Bleeding  . Dysuria    HPI Pt was seen at 0920.  Per EMS and pt report, c/o gradual onset and persistence of constant vaginal area "rash" and "pain" x4 days.  Describes the pain as "burning" and occasionally "itching."  Pt was eval in the ED 2 days ago for same, dx herpes and BV, given topical lidocaine gel for pain, rx multiple meds.  Pt states she did not get any of her medication filled.  Pt states she [facility] yesterday and has a "Child psychotherapist trying to help me get services."  Also states she "just started bleeding" today and is not sure if it is her menses.  Denies fevers, no abd pain, no back pain, no N/V/D, no CP/SOB.    Past Medical History  Diagnosis Date  . Abnormal Pap smear   . Anemia   . Mental disorder     bipolar, schizophrenia  . Bipolar 1 disorder   . Schizophrenia   . Blood dyscrasia     Sickle Cell Trait  . Scoliosis   . Sickle cell trait     Past Surgical History  Procedure Date  . Laparoscopic cholecystectomy     during second pregnancy  . Femoral osteotomy w/ rodding 2004    s/p accident , leg fx    Family History  Problem Relation Age of Onset  . Cancer Mother   . Kidney disease Mother   . Alcohol abuse Mother   . Cancer Maternal Grandmother     History  Substance Use Topics  . Smoking status: Current Everyday Smoker -- 0.0 packs/day for 11 years    Types: Cigarettes  . Smokeless tobacco: Never Used  . Alcohol Use: Yes    OB History    Grav Para Term Preterm Abortions TAB SAB Ect Mult Living   4 3 3  0 1 0 1 0 0 3      Review of Systems ROS: Statement: All systems negative except as marked or noted in the HPI; Constitutional: Negative for fever and chills. ; ; Eyes: Negative for eye pain, redness and discharge. ; ; ENMT: Negative for ear pain,  hoarseness, nasal congestion, sinus pressure and sore throat. ; ; Cardiovascular: Negative for chest pain, palpitations, diaphoresis, dyspnea and peripheral edema. ; ; Respiratory: Negative for cough, wheezing and stridor. ; ; Gastrointestinal: Negative for nausea, vomiting, diarrhea, abdominal pain, blood in stool, hematemesis, jaundice and rectal bleeding. . ; ; Genitourinary: Negative for dysuria, flank pain and hematuria. ; GYN:  +menses/vaginal bleeding, no vaginal discharge, +vulvar pain.;; Musculoskeletal: Negative for back pain and neck pain. Negative for swelling and trauma.; ; Skin: Negative for abrasions, bruising and +skin lesion.; ; Neuro: Negative for headache, lightheadedness and neck stiffness. Negative for weakness, altered level of consciousness , altered mental status, extremity weakness, paresthesias, involuntary movement, seizure and syncope.      Allergies  Bee venom; Codeine; and Latex  Home Medications   Current Outpatient Rx  Name Route Sig Dispense Refill  . ACYCLOVIR 400 MG PO TABS Oral Take 1 tablet (400 mg total) by mouth 3 (three) times daily. 30 tablet 0  . CITALOPRAM HYDROBROMIDE 20 MG PO TABS Oral Take 20 mg by mouth daily.     Marland Kitchen FERROUS SULFATE  325 (65 FE) MG PO TABS Oral Take 1 tablet (325 mg total) by mouth 3 (three) times daily with meals. 30 tablet 3  . HYDROCODONE-ACETAMINOPHEN 5-325 MG PO TABS  1 PO q6 hours prn pain 15 tablet 0  . METRONIDAZOLE 500 MG PO TABS Oral Take 1 tablet (500 mg total) by mouth 2 (two) times daily. 14 tablet 0  . ZOLPIDEM TARTRATE 10 MG PO TABS Oral Take 5 mg by mouth at bedtime as needed. sleep      BP 130/85  Pulse 91  Temp 99.1 F (37.3 C) (Oral)  Resp 16  Ht 5' (1.524 m)  Wt 140 lb (63.504 kg)  BMI 27.34 kg/m2  SpO2 99%  Physical Exam 0925: Physical examination:  Nursing notes reviewed; Vital signs and O2 SAT reviewed;  Constitutional: Well developed, Well nourished, Well hydrated, In no acute distress; Head:   Normocephalic, atraumatic; Eyes: EOMI, PERRL, No scleral icterus; ENMT: Mouth and pharynx normal, Mucous membranes moist; Neck: Supple, Full range of motion, No lymphadenopathy; Cardiovascular: Regular rate and rhythm, No murmur, rub, or gallop; Respiratory: Breath sounds clear & equal bilaterally, No rales, rhonchi, wheezes.  Speaking full sentences with ease, Normal respiratory effort/excursion; Chest: Nontender, Movement normal; Abdomen: Soft, Nontender, Nondistended, Normal bowel sounds; Genitourinary: No CVA tenderness; Extremities: Pulses normal, No tenderness, No edema, No calf edema or asymmetry.; Neuro: AA&Ox3, Major CN grossly intact.  Speech clear. No gross focal motor or sensory deficits in extremities.; Skin: Color normal, Warm, Dry.   ED Course  Procedures    MDM  MDM Reviewed: previous chart, nursing note and vitals Reviewed previous: labs Interpretation: labs     Results for orders placed during the hospital encounter of 06/22/11  PREGNANCY, URINE      Component Value Range   Preg Test, Ur NEGATIVE  NEGATIVE  URINALYSIS, ROUTINE W REFLEX MICROSCOPIC      Component Value Range   Color, Urine YELLOW  YELLOW   APPearance HAZY (*) CLEAR   Specific Gravity, Urine 1.025  1.005 - 1.030   pH 6.0  5.0 - 8.0   Glucose, UA NEGATIVE  NEGATIVE mg/dL   Hgb urine dipstick MODERATE (*) NEGATIVE   Bilirubin Urine NEGATIVE  NEGATIVE   Ketones, ur TRACE (*) NEGATIVE mg/dL   Protein, ur NEGATIVE  NEGATIVE mg/dL   Urobilinogen, UA 0.2  0.0 - 1.0 mg/dL   Nitrite NEGATIVE  NEGATIVE   Leukocytes, UA NEGATIVE  NEGATIVE  URINE MICROSCOPIC-ADD ON      Component Value Range   Squamous Epithelial / LPF FEW (*) RARE   WBC, UA 7-10  <3 WBC/hpf   RBC / HPF 3-6  <3 RBC/hpf   Bacteria, UA FEW (*) RARE   Urine-Other FEW YEAST        1000:  [facility] called regarding pt's pending GC/chlam from 06/20/11:  States results are still pending.  Pt was seen by myself during her last ED visit 2  days ago; she was given 1st doses of meds in the ED and was empirically treated for GC and chlam.  Pt was given rx for BV and herpes, which she has not filled.  States she couldn't get the rx because "my aunt was going to pay for them with a credit card and she didn't have ID so they didn't give her the rx."  Also states she "never got any cream" while in the ED during her last visit (topical lidocaine gel).  Very long d/w pt with Consulting civil engineer  present regarding the importance of filling her medication rx.  Endorses she already has a Child psychotherapist helping her obtain [facility], social service assistance programs, etc.   Pt was given another tube of topical lidocaine gel for pain control today, dose of vicodin, dose of acyclovir, dose of flagyl, and dose of diflucan for few yeast found on Udip.  Pt walking around ED exam room talking on her cellphone NAD, resps easy, gait steady.     10:28 AM:  Pt now states that a family member is "going to go get my rx filled right now" and is ready to go.  Dx testing d/w pt. Questions answered.  Verb understanding, agreeable to d/c home with outpt f/u.              Laray Anger, DO 06/23/11 1749

## 2011-06-22 NOTE — ED Notes (Signed)
Patient also reports a tick bite on right side of abd.

## 2011-06-22 NOTE — Discharge Instructions (Signed)
RESOURCE GUIDE  Chronic Pain Problems: Contact [facility]  [phone] Patients need to be referred by their primary care doctor.  Insufficient Money for Medicine: Contact United Way:  call "211" or [facility] [phone].  No Primary Care Doctor: - Call [facility]  [phone] - can help you locate a primary care doctor that  accepts your insurance, provides certain services, etc. - Physician Referral Service- 1-800-533-3463  Agencies that provide inexpensive medical care: - [facility]  [phone] - [facility]  [phone] - [facility]  [phone] - [facility]  [phone] - Planned Parenthood  [phone] - [facility]  [phone]  Medicaid-accepting [place] Providers: - [facility]- [address]  [phone], Mon-Fri 9am-7pm, Sat 9am-1pm - [facility]- [address], [address]  [phone] - [facility]- [address], [address]  [phone] - [facility]- [address] [place] [address]  [phone] - Veita Bland- [address], [phone]  Only accepts [place] Access Medicaid patients after they have their name  applied to their card  Self Pay (no insurance) in [place]: - Sickle Cell Patients: Jennifer Barber, [facility]  [address], [phone] - [facility]- [address]  [phone]       -     [facility] [place]- [address]       -     [facility]- see information above (Speak to Pam H if you do not have insurance)       -  [facility]- [address], [phone]       -  [facility] [place]- [address],  [phone]       -  [facility]- [address] [place] [address], [phone]       -  [facility]-  [address], [place], [phone]       -  [facility]- [address], [phone]       -  [facility]- [address] [place] [address], [phone], also [address], [phone]       -    [facility]- [address], [phone], 1st & 3rd Saturday   every month, 10am-1pm  1) Find a Doctor and Pay Out of Pocket Although you won't have to find out who is covered by your insurance plan, it is a good idea to ask around and get recommendations. You will then need to call the office and see if the doctor you have chosen will accept you as a new patient and what types of options they offer for patients who are self-pay. Some doctors offer discounts or will set up payment plans for their patients who do not have insurance, but you will need to ask so you aren't surprised when you get to your appointment.  2) Contact Your Local Health Department Not all health departments have doctors that can see patients for sick visits, but many do, so it is worth a call to see if yours does. If you don't know where your local health department is, you can check in your phone book. The CDC also has a tool to help you locate your state's health department, and many state websites also have   listings of all of their local health departments.  3) Find a Walk-in Clinic If your illness is not likely to be very severe or complicated, you may want to try a walk in clinic. These are popping up all over the country in pharmacies, drugstores, and shopping centers. They're usually staffed by nurse practitioners or physician assistants that have been trained to treat common illnesses and complaints. They're usually fairly quick and inexpensive. However, if you have serious medical issues or chronic medical problems, these are probably not your best option  STD Testing - [place] [facility] [place], [facility] Clinic, [address], [place], phone [phone] or [phone].  Monday - Friday, call for an appointment. - [place]  [facility] [place], [facility]D Clinic, [address], [place], phone [phone] or [phone].  Monday - Friday, call for an appointment.  Abuse/Neglect: - [place] Child Abuse Hotline (336) 641-3795 - [place] Child Abuse Hotline 800-378-5315 (After Hours)  Emergency Shelter:  [facility] (336) 271-5985  Maternity Homes: - [facility] (336) 275-9566 - [facility] (704) 372-4663  MRSA Hotline #:   832-7006  [place] Resources  [facility] [place]  [employer] [place] [facility] [address]                 [address]         [address]  [place]                                               [place]                              [place] Phone:  [phone]                                  Phone:  [phone]                   Phone:  [phone]  [place] [facility], [phone] - [place] Services - [facility]- 1-888-581-9988       -     [facility] in [place], [address],                                  336-349-4454, Insurance  [place] Child Abuse Hotline (336) 342-1394 or (336) 342-3537 (After Hours)   Behavioral Health Services  Substance Abuse Resources: - [facility]  336-882-2125 - [facility] 336-784-9470 - [facility] 336-285-9073 - [facility] 336-845-3988 - Residential & Outpatient Substance Abuse Program  800-659-3381  Psychological Services: -  Health  [phone] - [facility]  [phone] - [place] [facility], [address], [place], ACCESS LINE: 1-800-853-5163 or 336-641-4981, Http://www.[facility].com/services/adult.htm  Dental Assistance  If unable to pay or uninsured, contact:  [facility] or [place] [facility] to become qualified for the adult dental  clinic.  Patients with Medicaid:  [facility] [address], [phone], [phone]  If unable   to pay, or uninsured, contact [facility] 438-424-7791) or [facility] [phone] in [place], [phone] in [facility]) to become qualified for the adult dental clinic  Other Low-Cost Community Dental Services: - [facility]- [address] [place], [place], [place], [address], [phone], [phone], 2nd and 4th Thursday of the month at 6:30am.  10 clients each day by appointment, can sometimes see walk-in patients if someone does not show for an appointment. [facility]- [address] [place], [place], [address], [phone] - [facility]- [address] [place], [place], [address], [phone] [facility]- [phone] [facility] Department- [phone] [facility] [phone]      Fill your prescriptions from your last Emergency Department visit and take them as directed.  Use the topical lidocaine gel:  Apply topically and sparingly to affected area every 4 hours as needed for pain for the next 7 days, wash the area with soap and water before re-applying.  Your gonorrhea and chlamydia culture is pending results, and you will receive a phone call in the next several days if it is positive.  However, you were treated empirically during your last Emergency Department visit with antibiotics for both gonorrhea and chlamydia.  Call your regular OB/GYN doctor on Monday morning to schedule a follow up appointment within the next week.  Return to the Emergency Department immediately if worsening.

## 2011-06-22 NOTE — ED Notes (Signed)
Patient brought in via EMS. Alert and oriented. Airway partent. Patient c/o vaginal bleeding with clots and pelvis pain. Patient also c/o pain/burning with urination. Patient reports being seen here on Thursday and treated for herpes with a "shot" and some oral medication. Patient reports vomiting up medication while here on Thursday and being nauseated since. Patient also states she was not able to afford the prescription she was given.

## 2011-06-25 LAB — GC/CHLAMYDIA PROBE AMP, GENITAL
Chlamydia, DNA Probe: POSITIVE — AB
GC Probe Amp, Genital: NEGATIVE

## 2011-06-26 NOTE — ED Notes (Signed)
+   Chlamydia Patient treated with rocephin and Zithromax DHHS letter faxed.

## 2011-06-28 NOTE — ED Notes (Signed)
Attempted to contact patient. Patient was unavailable. 

## 2011-06-29 NOTE — ED Notes (Signed)
Attempted to contact patient. Patient was unavailable.

## 2011-06-30 NOTE — ED Notes (Signed)
Attempted to contact patient. Patient is unavailable. Sent letter after no answer x 3.

## 2011-07-02 NOTE — ED Notes (Signed)
No response from letter:chart closed out and sent to Medical records.

## 2011-08-28 ENCOUNTER — Encounter (HOSPITAL_COMMUNITY): Payer: Self-pay

## 2011-08-28 ENCOUNTER — Emergency Department (HOSPITAL_COMMUNITY)
Admission: EM | Admit: 2011-08-28 | Discharge: 2011-08-28 | Disposition: A | Discharge status: Home / Self Care | Payer: Self-pay | Attending: Emergency Medicine | Admitting: Emergency Medicine

## 2011-08-28 DIAGNOSIS — F319 Bipolar disorder, unspecified: Secondary | ICD-10-CM | POA: Insufficient documentation

## 2011-08-28 DIAGNOSIS — F17200 Nicotine dependence, unspecified, uncomplicated: Secondary | ICD-10-CM | POA: Insufficient documentation

## 2011-08-28 DIAGNOSIS — Z139 Encounter for screening, unspecified: Secondary | ICD-10-CM

## 2011-08-28 DIAGNOSIS — F209 Schizophrenia, unspecified: Secondary | ICD-10-CM | POA: Insufficient documentation

## 2011-08-28 DIAGNOSIS — D573 Sickle-cell trait: Secondary | ICD-10-CM | POA: Insufficient documentation

## 2011-08-28 DIAGNOSIS — R5381 Other malaise: Secondary | ICD-10-CM | POA: Insufficient documentation

## 2011-08-28 DIAGNOSIS — R51 Headache: Secondary | ICD-10-CM | POA: Insufficient documentation

## 2011-08-28 LAB — URINALYSIS, ROUTINE W REFLEX MICROSCOPIC
Bilirubin Urine: NEGATIVE
Glucose, UA: NEGATIVE mg/dL
Hgb urine dipstick: NEGATIVE
Ketones, ur: NEGATIVE mg/dL
Leukocytes, UA: NEGATIVE
Nitrite: NEGATIVE
Protein, ur: NEGATIVE mg/dL
Specific Gravity, Urine: 1.025 (ref 1.005–1.030)
Urobilinogen, UA: 0.2 mg/dL (ref 0.0–1.0)
pH: 5.5 (ref 5.0–8.0)

## 2011-08-28 LAB — BASIC METABOLIC PANEL
BUN: 8 mg/dL (ref 6–23)
CO2: 25 mEq/L (ref 19–32)
Calcium: 9.4 mg/dL (ref 8.4–10.5)
Chloride: 105 mEq/L (ref 96–112)
Creatinine, Ser: 0.95 mg/dL (ref 0.50–1.10)
GFR calc Af Amer: >90 mL/min (ref >90)
GFR calc non Af Amer: 81 mL/min — ABNORMAL LOW (ref >90)
Glucose, Bld: 91 mg/dL (ref 70–99)
Potassium: 3.9 mEq/L (ref 3.5–5.1)
Sodium: 138 mEq/L (ref 135–145)

## 2011-08-28 LAB — CBC WITH DIFFERENTIAL/PLATELET
Basophils Absolute: 0.1 10*3/uL (ref 0.0–0.1)
Basophils Relative: 1 % (ref 0–1)
Eosinophils Absolute: 0.1 10*3/uL (ref 0.0–0.7)
Eosinophils Relative: 2 % (ref 0–5)
HCT: 38.6 % (ref 36.0–46.0)
Hemoglobin: 13.1 g/dL (ref 12.0–15.0)
Lymphocytes Relative: 63 % — ABNORMAL HIGH (ref 12–46)
Lymphs Abs: 4 10*3/uL (ref 0.7–4.0)
MCH: 27.2 pg (ref 26.0–34.0)
MCHC: 33.9 g/dL (ref 30.0–36.0)
MCV: 80.1 fL (ref 78.0–100.0)
Monocytes Absolute: 0.3 10*3/uL (ref 0.1–1.0)
Monocytes Relative: 5 % (ref 3–12)
Neutro Abs: 1.9 10*3/uL (ref 1.7–7.7)
Neutrophils Relative %: 30 % — ABNORMAL LOW (ref 43–77)
Platelets: 215 10*3/uL (ref 150–400)
RBC: 4.82 MIL/uL (ref 3.87–5.11)
RDW: 13.3 % (ref 11.5–15.5)
WBC: 6.4 10*3/uL (ref 4.0–10.5)

## 2011-08-28 LAB — PREGNANCY, URINE: Preg Test, Ur: NEGATIVE

## 2011-08-28 MED ORDER — ACETAMINOPHEN 500 MG PO TABS
1000.0000 mg | ORAL_TABLET | Freq: Once | ORAL | Status: AC
  Administered 2011-08-28: 1000 mg via ORAL
  Filled 2011-08-28: qty 2

## 2011-08-28 MED ORDER — IBUPROFEN 400 MG PO TABS
400.0000 mg | ORAL_TABLET | Freq: Once | ORAL | Status: AC
  Administered 2011-08-28: 400 mg via ORAL
  Filled 2011-08-28: qty 1

## 2011-08-28 NOTE — ED Notes (Signed)
Pt stable at discharge with steady gait; Neuro WNL. Pt to follow up with [facility] Pt has appt in OCT however is going to call in am for sooner appt

## 2011-08-28 NOTE — ED Notes (Signed)
Pt says her medicaid ran out and she can't afford her medications.

## 2011-08-28 NOTE — ED Provider Notes (Signed)
History     CSN: [id number]  Arrival date & time 08/28/11  1641   First MD Initiated Contact with Patient 08/28/11 1648      Chief Complaint  Patient presents with  . Fatigue    HPI Pt was seen at 1720.  Per pt, c/o gradual onset and persistence of constantly feeling "angry then sad" for the past 2 weeks.  Symptoms began after she ran out of her psych meds.  States she is due to see her Child psychotherapist tomorrow to help her obtain her meds. Also states she has been feeling "weak and tired," as well as had a frontal "migraine headache" for the past 3 days.  Denies HI, no SI, no hallucinations. Denies headache was sudden or maximal in onset or at any time.  Denies visual changes, no focal motor weakness, no tingling/numbness in extremities, no fevers, no neck pain, no rash, no abd pain, no N/V/D, no CP/SOB.      Past Medical History  Diagnosis Date  . Abnormal Pap smear   . Anemia   . Mental disorder     bipolar, schizophrenia  . Bipolar 1 disorder   . Schizophrenia   . Blood dyscrasia     Sickle Cell Trait  . Scoliosis   . Sickle cell trait     Past Surgical History  Procedure Date  . Laparoscopic cholecystectomy     during second pregnancy  . Femoral osteotomy w/ rodding 2004    s/p accident , leg fx    Family History  Problem Relation Age of Onset  . Cancer Mother   . Kidney disease Mother   . Alcohol abuse Mother   . Cancer Maternal Grandmother     History  Substance Use Topics  . Smoking status: Current Everyday Smoker -- 0.0 packs/day for 11 years    Types: Cigarettes  . Smokeless tobacco: Never Used  . Alcohol Use: Yes     pt denies smoking, drinking or drug use    OB History    Grav Para Term Preterm Abortions TAB SAB Ect Mult Living   4 3 3  0 1 0 1 0 0 3      Review of Systems ROS: Statement: All systems negative except as marked or noted in the HPI; Constitutional: +fatigue. Negative for fever and chills. ; ; Eyes: Negative for eye pain, redness  and discharge. ; ; ENMT: Negative for ear pain, hoarseness, nasal congestion, sinus pressure and sore throat. ; ; Cardiovascular: Negative for chest pain, palpitations, diaphoresis, dyspnea and peripheral edema. ; ; Respiratory: Negative for cough, wheezing and stridor. ; ; Gastrointestinal: Negative for nausea, vomiting, diarrhea, abdominal pain, blood in stool, hematemesis, jaundice and rectal bleeding. . ; ; Genitourinary: Negative for dysuria, flank pain and hematuria. ; ; Musculoskeletal: Negative for back pain and neck pain. Negative for swelling and trauma.; ; Skin: Negative for pruritus, rash, abrasions, blisters, bruising and skin lesion.; ; Neuro: +headache. Negative for lightheadedness and neck stiffness. Negative for weakness, altered level of consciousness , altered mental status, extremity weakness, paresthesias, involuntary movement, seizure and syncope.; Psych:  No SI, no SA, no HI, no hallucinations.    Allergies  Bee venom; Codeine; and Latex  Home Medications   Current Outpatient Rx  Name Route Sig Dispense Refill  . CITALOPRAM HYDROBROMIDE 20 MG PO TABS Oral Take 20 mg by mouth daily.     Marland Kitchen FERROUS SULFATE 325 (65 FE) MG PO TABS Oral Take 1 tablet (325 mg total)  by mouth 3 (three) times daily with meals. 30 tablet 3  . HYDROXYZINE PAMOATE 25 MG PO CAPS Oral Take 25 mg by mouth 3 (three) times daily as needed.    Marland Kitchen QUETIAPINE FUMARATE 200 MG PO TABS Oral Take 200 mg by mouth daily. At 4pm    . ZOLPIDEM TARTRATE 10 MG PO TABS Oral Take 5 mg by mouth at bedtime as needed. sleep      BP 107/65  Pulse 59  Temp 98.4 F (36.9 C) (Oral)  Resp 20  Ht 5' (1.524 m)  Wt 140 lb (63.504 kg)  BMI 27.34 kg/m2  SpO2 100%  Physical Exam 1725: Physical examination:  Nursing notes reviewed; Vital signs and O2 SAT reviewed;  Constitutional: Well developed, Well nourished, Well hydrated, In no acute distress; Head:  Normocephalic, atraumatic; Eyes: EOMI, PERRL, No scleral icterus; ENMT:  Mouth and pharynx normal, Mucous membranes moist; Neck: Supple, Full range of motion, No lymphadenopathy; Cardiovascular: Regular rate and rhythm, No murmur, rub, or gallop; Respiratory: Breath sounds clear & equal bilaterally, No rales, rhonchi, wheezes.  Speaking full sentences with ease, Normal respiratory effort/excursion; Chest: Nontender, Movement normal; Abdomen: Soft, Nontender, Nondistended, Normal bowel sounds; Genitourinary: No CVA tenderness; Extremities: Pulses normal, No tenderness, No edema, No calf edema or asymmetry.; Neuro: AA&Ox3, Major CN grossly intact.  Speech clear. No facial droop.  Gait steady.  Climbs on and off stretcher easily.  No gross focal motor or sensory deficits in extremities.; Skin: Color normal, Warm, Dry, no rash.; Psych:  Denies SI, no hallucinations.     ED Course  Procedures    MDM  MDM Reviewed: nursing note, vitals and previous chart Interpretation: labs   Results for orders placed during the hospital encounter of 08/28/11  CBC WITH DIFFERENTIAL      Component Value Range   WBC 6.4  4.0 - 10.5 K/uL   RBC 4.82  3.87 - 5.11 MIL/uL   Hemoglobin 13.1  12.0 - 15.0 g/dL   HCT 40.9  81.1 - 91.4 %   MCV 80.1  78.0 - 100.0 fL   MCH 27.2  26.0 - 34.0 pg   MCHC 33.9  30.0 - 36.0 g/dL   RDW 78.2  95.6 - 21.3 %   Platelets 215  150 - 400 K/uL   Neutrophils Relative 30 (*) 43 - 77 %   Neutro Abs 1.9  1.7 - 7.7 K/uL   Lymphocytes Relative 63 (*) 12 - 46 %   Lymphs Abs 4.0  0.7 - 4.0 K/uL   Monocytes Relative 5  3 - 12 %   Monocytes Absolute 0.3  0.1 - 1.0 K/uL   Eosinophils Relative 2  0 - 5 %   Eosinophils Absolute 0.1  0.0 - 0.7 K/uL   Basophils Relative 1  0 - 1 %   Basophils Absolute 0.1  0.0 - 0.1 K/uL  BASIC METABOLIC PANEL      Component Value Range   Sodium 138  135 - 145 mEq/L   Potassium 3.9  3.5 - 5.1 mEq/L   Chloride 105  96 - 112 mEq/L   CO2 25  19 - 32 mEq/L   Glucose, Bld 91  70 - 99 mg/dL   BUN 8  6 - 23 mg/dL   Creatinine, Ser  0.86  0.50 - 1.10 mg/dL   Calcium 9.4  8.4 - 57.8 mg/dL   GFR calc non Af Amer 81 (*) >90 mL/min   GFR calc Af Amer >90  >  90 mL/min  URINALYSIS, ROUTINE W REFLEX MICROSCOPIC      Component Value Range   Color, Urine YELLOW  YELLOW   APPearance CLEAR  CLEAR   Specific Gravity, Urine 1.025  1.005 - 1.030   pH 5.5  5.0 - 8.0   Glucose, UA NEGATIVE  NEGATIVE mg/dL   Hgb urine dipstick NEGATIVE  NEGATIVE   Bilirubin Urine NEGATIVE  NEGATIVE   Ketones, ur NEGATIVE  NEGATIVE mg/dL   Protein, ur NEGATIVE  NEGATIVE mg/dL   Urobilinogen, UA 0.2  0.0 - 1.0 mg/dL   Nitrite NEGATIVE  NEGATIVE   Leukocytes, UA NEGATIVE  NEGATIVE  PREGNANCY, URINE      Component Value Range   Preg Test, Ur NEGATIVE  NEGATIVE       1950:  Continues to deny SI, no HI, no hallucinations, no psychosis while in the ED.  Feels improved and wants to go home now.  Dx testing d/w pt.  Questions answered.  Verb understanding, agreeable to d/c home with outpt f/u tomorrow with her Child psychotherapist.        Laray Anger, DO 08/31/11 [phone]

## 2011-08-28 NOTE — ED Notes (Signed)
Pt given saltine crackers per request  

## 2011-08-28 NOTE — ED Notes (Signed)
MD at bedside. 

## 2011-08-28 NOTE — ED Notes (Signed)
Assisted to restroom, pt ambulated without difficulty.

## 2011-08-28 NOTE — ED Notes (Signed)
Pt reports has been out of seroquel, ambien, hydralazine, and celexa x 1 1/2 weeks.  Reports having headache and feeling weak x 3 days.

## 2012-09-11 IMAGING — CR DG CHEST 2V
2 series · 2 of 2 positions shown · non-contrast
Comparison: Thoracic spine radiographs 03/14/2007.

CLINICAL DATA: Cough, congestion and fever.  Question pneumonia.

CHEST - 2 VIEW

[view not recorded (1 of 2)]
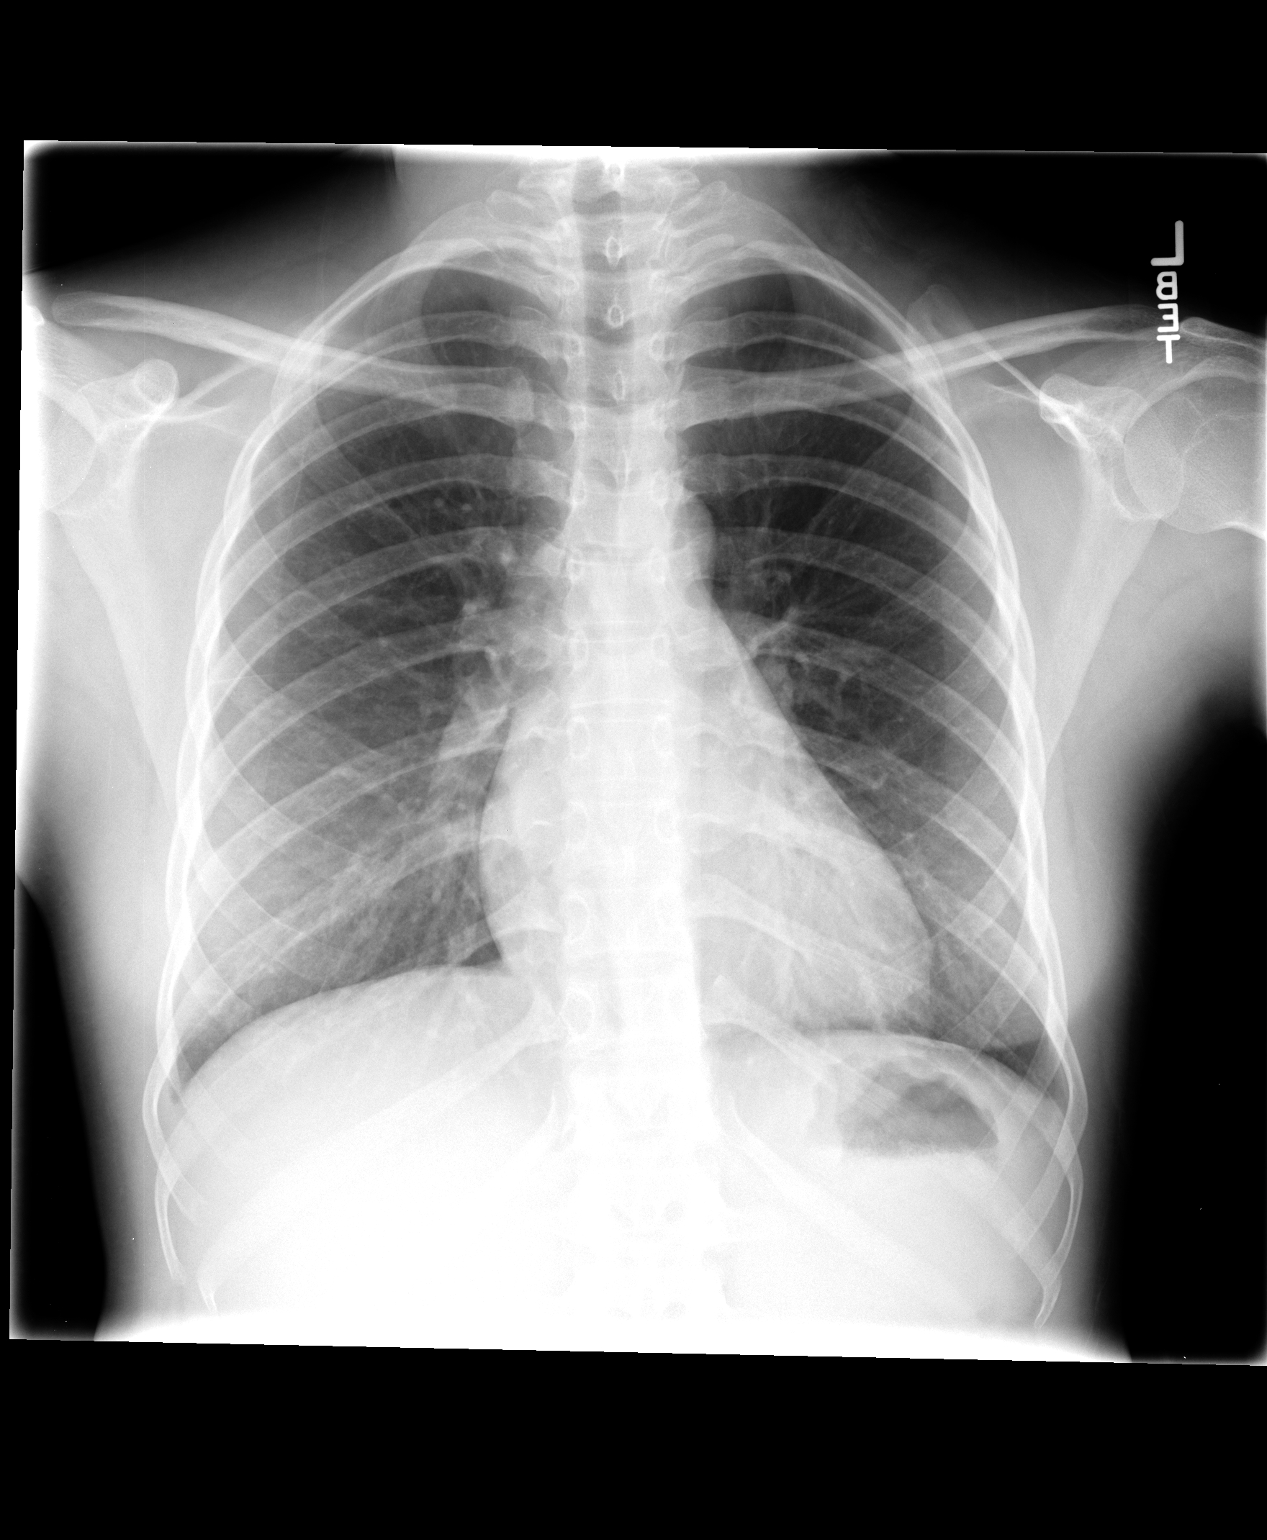

[view not recorded (2 of 2)]
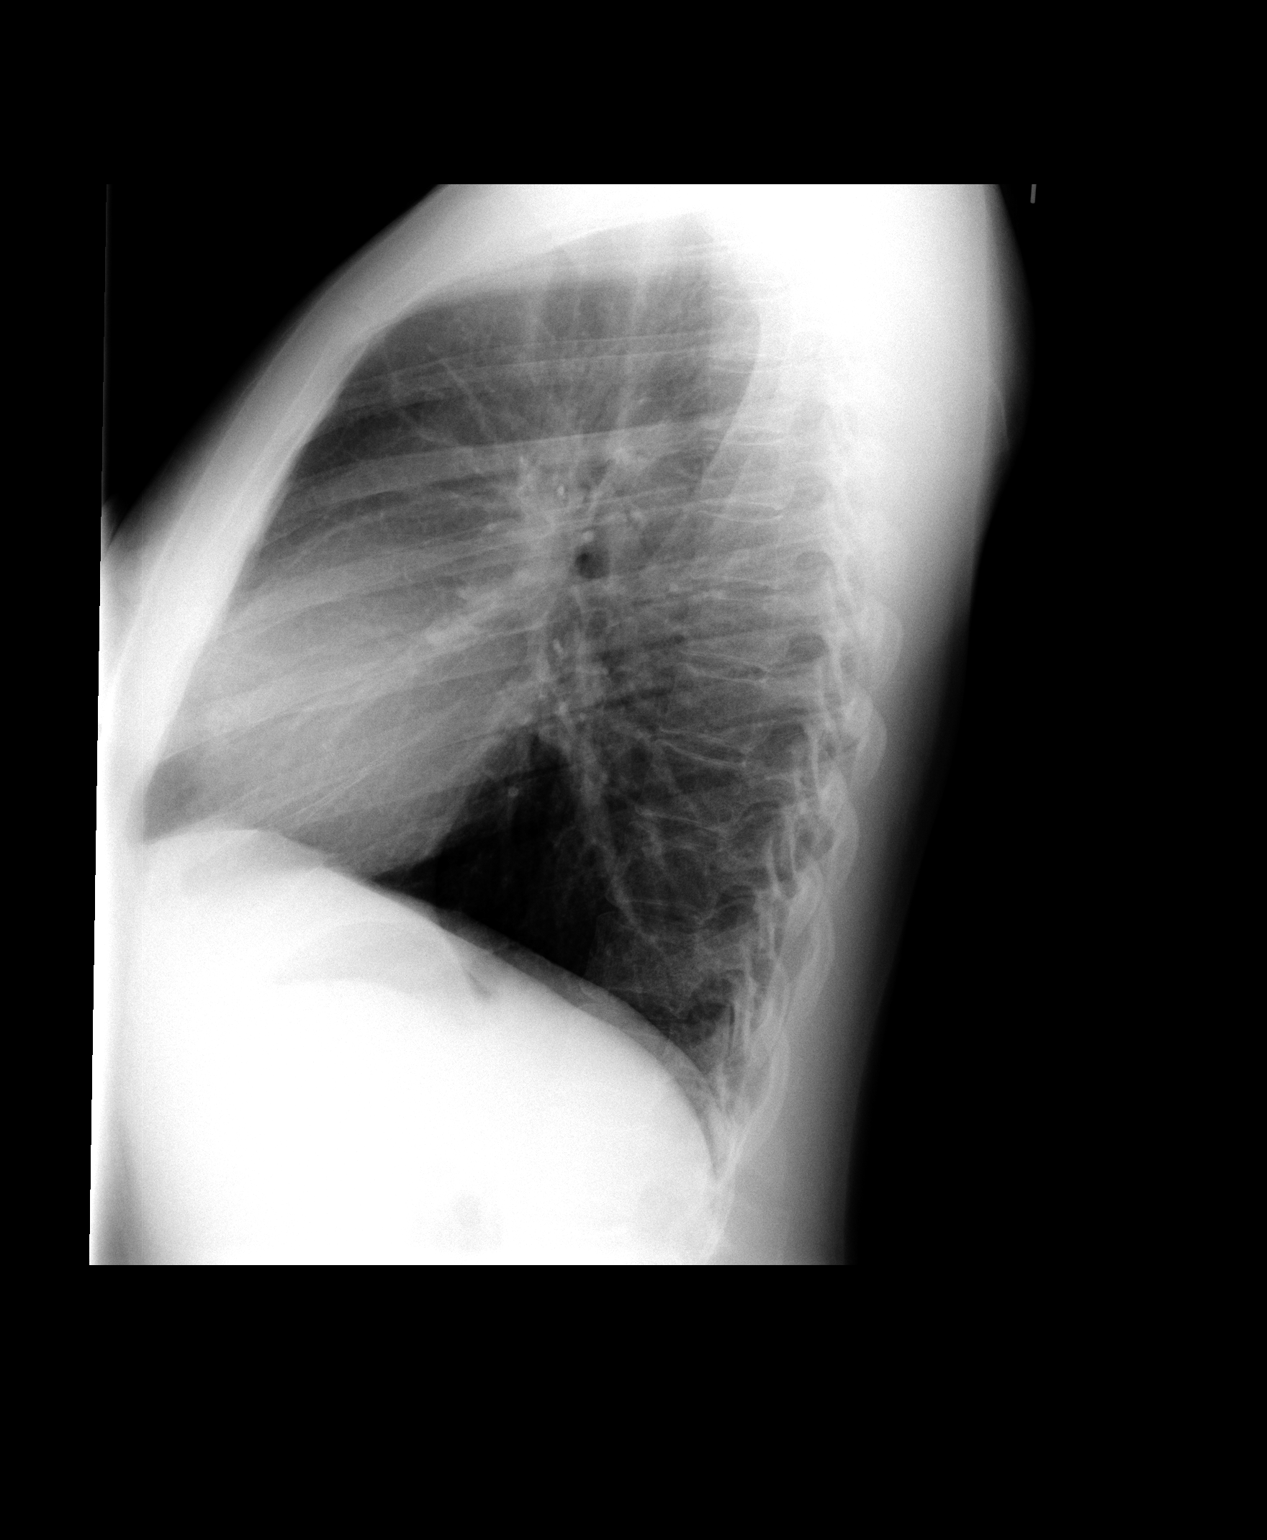

[2 of 2 positions shown; findings below may reference images not displayed]

FINDINGS: The heart size and mediastinal contours are normal. The
lungs are clear. There is no pleural effusion or pneumothorax. No
acute osseous findings are identified.
IMPRESSION: No evidence of pneumonia or other active cardiopulmonary process.

## 2013-01-07 NOTE — L&D Delivery Note (Signed)
Delivery Note  PRE-OPERATIVE DIAGNOSIS:  1) [redacted]w[redacted]d pregnancy.   POST-OPERATIVE DIAGNOSIS:  1) [redacted]w[redacted]d pregnancy s/p Vaginal, Spontaneous Delivery   Delivery Type: Vaginal, Spontaneous Delivery   Delivery Clinician: Delice Lesch   Delivery Anesthesia: Epidural   Labor Complications: None   Lacerations: Periuretheral, hemostatic  ESTIMATED BLOOD LOSS:  200 cc  Labor course: This is a 28 y.o. y.o. female [phone]  who came in at [redacted]w[redacted]d pregnancy complaining of contractions.  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  Initial cervical exam was 7/100/0.  She was admitted to L and D.  Labor course included:  Epidural placement SROM with clear fluid   Procedure: Vaginal, Spontaneous Delivery   Date of birth: 08/17/2013   Time of birth: 12:10 AM    This [id number] woman under epidural anesthesia delivered a viable female  infant weighing  7 lbs 13 oz  with Apgars as listed below.  Delivery was via NSVD  Delivery completed and cord cut and clamped. Infant dried and stimulated. Infant to maternal abdomen. Cord pH not obtained. Active management of the third stage of labor performed. Intact placenta delivered spontaneously at 08/17/2013 @ 12:17AM. Vagina and cervix explored and periuretheral lacerations noted good approximation of tissue and hemostasis.  Uterus well contracted at end of delivery.  Mother and infant tolerated delivery well.   FINDINGS:   1) female infant, Apgar scores of   at 1 minute   at 5 minutes   2) 3 Vessel Cord  3) Nuchal: No  SPECIMENS: Placenta Discarded; Cord gases not sent  COMPLICATIONS: none  DISPOSITION:  Infant to NBN

## 2013-02-10 ENCOUNTER — Other Ambulatory Visit: Payer: Self-pay | Admitting: Obstetrics & Gynecology

## 2013-02-10 ENCOUNTER — Other Ambulatory Visit: Payer: Medicaid Other

## 2013-02-10 ENCOUNTER — Ambulatory Visit (INDEPENDENT_AMBULATORY_CARE_PROVIDER_SITE_OTHER): Payer: Medicaid Other

## 2013-02-10 ENCOUNTER — Encounter (INDEPENDENT_AMBULATORY_CARE_PROVIDER_SITE_OTHER): Payer: Self-pay

## 2013-02-10 DIAGNOSIS — Z36 Encounter for antenatal screening of mother: Secondary | ICD-10-CM

## 2013-02-10 DIAGNOSIS — N926 Irregular menstruation, unspecified: Secondary | ICD-10-CM

## 2013-02-10 DIAGNOSIS — O9989 Other specified diseases and conditions complicating pregnancy, childbirth and the puerperium: Secondary | ICD-10-CM

## 2013-02-10 DIAGNOSIS — O3680X0 Pregnancy with inconclusive fetal viability, not applicable or unspecified: Secondary | ICD-10-CM

## 2013-02-10 DIAGNOSIS — O99891 Other specified diseases and conditions complicating pregnancy: Secondary | ICD-10-CM

## 2013-02-10 NOTE — Progress Notes (Signed)
U/S-single IUP with +FCa noted, FHR- 159 bpm, cx appears long and closed, anterior Gr 0 placenta, bilateral adnexa appears WNL, CRL c/w 12+6wks EDD 08/19/2013, pt offered NT/IT screen and would like to have NB present, NT-1.4mm

## 2013-02-15 LAB — MATERNAL SCREEN, INTEGRATED #1

## 2013-02-16 ENCOUNTER — Encounter: Payer: Self-pay | Admitting: Advanced Practice Midwife

## 2013-02-16 ENCOUNTER — Ambulatory Visit (INDEPENDENT_AMBULATORY_CARE_PROVIDER_SITE_OTHER): Payer: Medicaid Other | Admitting: Advanced Practice Midwife

## 2013-02-16 VITALS — BP 96/58 | Wt 132.0 lb

## 2013-02-16 DIAGNOSIS — F209 Schizophrenia, unspecified: Secondary | ICD-10-CM | POA: Insufficient documentation

## 2013-02-16 DIAGNOSIS — Z3480 Encounter for supervision of other normal pregnancy, unspecified trimester: Secondary | ICD-10-CM | POA: Insufficient documentation

## 2013-02-16 DIAGNOSIS — Z331 Pregnant state, incidental: Secondary | ICD-10-CM

## 2013-02-16 DIAGNOSIS — F419 Anxiety disorder, unspecified: Secondary | ICD-10-CM

## 2013-02-16 DIAGNOSIS — O98519 Other viral diseases complicating pregnancy, unspecified trimester: Secondary | ICD-10-CM

## 2013-02-16 DIAGNOSIS — D759 Disease of blood and blood-forming organs, unspecified: Secondary | ICD-10-CM | POA: Insufficient documentation

## 2013-02-16 DIAGNOSIS — F319 Bipolar disorder, unspecified: Secondary | ICD-10-CM | POA: Insufficient documentation

## 2013-02-16 DIAGNOSIS — B009 Herpesviral infection, unspecified: Secondary | ICD-10-CM

## 2013-02-16 DIAGNOSIS — Z1389 Encounter for screening for other disorder: Secondary | ICD-10-CM

## 2013-02-16 DIAGNOSIS — O09299 Supervision of pregnancy with other poor reproductive or obstetric history, unspecified trimester: Secondary | ICD-10-CM

## 2013-02-16 DIAGNOSIS — F32A Depression, unspecified: Secondary | ICD-10-CM | POA: Insufficient documentation

## 2013-02-16 DIAGNOSIS — Z3490 Encounter for supervision of normal pregnancy, unspecified, unspecified trimester: Secondary | ICD-10-CM

## 2013-02-16 DIAGNOSIS — O99019 Anemia complicating pregnancy, unspecified trimester: Secondary | ICD-10-CM

## 2013-02-16 DIAGNOSIS — F329 Major depressive disorder, single episode, unspecified: Secondary | ICD-10-CM

## 2013-02-16 DIAGNOSIS — O99340 Other mental disorders complicating pregnancy, unspecified trimester: Secondary | ICD-10-CM

## 2013-02-16 DIAGNOSIS — R768 Other specified abnormal immunological findings in serum: Secondary | ICD-10-CM | POA: Insufficient documentation

## 2013-02-16 LAB — POCT URINALYSIS DIPSTICK
Blood, UA: NEGATIVE
Glucose, UA: NEGATIVE
Ketones, UA: NEGATIVE
Leukocytes, UA: NEGATIVE
Nitrite, UA: NEGATIVE
Protein, UA: NEGATIVE

## 2013-02-16 LAB — CBC
HCT: 35.9 % — ABNORMAL LOW (ref 36.0–46.0)
Hemoglobin: 11.9 g/dL — ABNORMAL LOW (ref 12.0–15.0)
MCH: 27.5 pg (ref 26.0–34.0)
MCHC: 33.1 g/dL (ref 30.0–36.0)
MCV: 82.9 fL (ref 78.0–100.0)
Platelets: 267 10*3/uL (ref 150–400)
RBC: 4.33 MIL/uL (ref 3.87–5.11)
RDW: 14 % (ref 11.5–15.5)
WBC: 7.5 10*3/uL (ref 4.0–10.5)

## 2013-02-16 MED ORDER — CITALOPRAM HYDROBROMIDE 20 MG PO TABS: 20.0000 mg | ORAL_TABLET | Freq: Every day | ORAL | Status: DC

## 2013-02-16 MED ORDER — BUTALBITAL-APAP-CAFFEINE 50-325-40 MG PO TABS: 1.0000 | ORAL_TABLET | Freq: Four times a day (QID) | ORAL | Status: DC | PRN

## 2013-02-16 NOTE — Progress Notes (Signed)
  Subjective:    Jennifer Barber is a [id number] [redacted]w[redacted]d being seen today for her first obstetrical visit.  Her obstetrical history is significant for nothing Pregnancy history fully reviewed.  She has been off bipolar and schizophrenia meds for a few months.  Feels depressed and angry.  Will restart celexa 20mg .  Advised to restart counseling. Craves smelling starchy foods.  Discussed PICA.  Patient reports headache, nausea and dizziness. Also c/o being emotional, crying spells.    Filed Vitals:   02/16/13 1013  BP: 96/58  Weight: 132 lb (59.875 kg)    HISTORY: OB History  Gravida Para Term Preterm AB SAB TAB Ectopic Multiple Living  5 3 3  0 1 1 0 0 0 3    # Outcome Date GA Lbr Len/2nd Weight Sex Delivery Anes PTL Lv  5 CUR           4 TRM 09/29/10 [redacted]w[redacted]d 26:12 / 00:10 5 lb 0.4 oz (2.28 kg) F SVD EPI  Y  3 TRM 09/15/08 [redacted]w[redacted]d   M   N Y  2 TRM 05/06/03 [redacted]w[redacted]d   F SVD EPI N Y  1 SAB              Past Medical History  Diagnosis Date  . Abnormal Pap smear   . Anemia   . Scoliosis   . Sickle cell trait   . Blood dyscrasia     Sickle Cell Trait  . Mental disorder     bipolar, schizophrenia  . Bipolar 1 disorder   . Schizophrenia   . Depression    Past Surgical History  Procedure Laterality Date  . Laparoscopic cholecystectomy      during second pregnancy  . Femoral osteotomy w/ rodding  2004    s/p accident , leg fx   Family History  Problem Relation Age of Onset  . Cancer Mother   . Kidney disease Mother   . Alcohol abuse Mother   . Mental illness Mother   . Cancer Maternal Grandmother      Exam       Pap normal at HD 6/14 normal                                    Skin: normal coloration and turgor, no rashes    Neurologic: oriented, normal, normal mood   Extremities: normal strength, tone, and muscle mass   HEENT PERRLA   Mouth/Teeth mucous membranes moist, pharynx normal without lesions   Neck supple and no masses   Cardiovascular: regular rate and  rhythm   Respiratory:  appears well, vitals normal, no respiratory distress, acyanotic, normal RR   Abdomen: soft, non-tender; bowel sounds normal; no masses,  no organomegaly   FHR 150       Assessment:    Pregnancy: [name] Patient Active Problem List   Diagnosis Date Noted  . Pregnant 02/16/2013  . Depression   . Blood dyscrasia   . Bipolar 1 disorder   . Schizophrenia         Plan:    Diclegis samples given Fioricet for HA prn Will restart Celexa 20mg  qd.  Recommended restarting therapy with her counselor.  Initial labs drawn. Prenatal vitamins. Problem list reviewed and updated. Genetic Screening discussed Integrated Screen: requested.  Ultrasound discussed; fetal survey: requested.  Follow up in 4 weeks for anatomy scan/2nd IT.  Jennifer Barber 02/16/2013

## 2013-02-17 LAB — DRUG SCREEN, URINE, NO CONFIRMATION
Amphetamine Screen, Ur: NEGATIVE
Barbiturate Quant, Ur: NEGATIVE
Benzodiazepines.: NEGATIVE
Cocaine Metabolites: NEGATIVE
Creatinine,U: 215.4 mg/dL
Marijuana Metabolite: NEGATIVE
Methadone: NEGATIVE
Opiate Screen, Urine: NEGATIVE
Phencyclidine (PCP): NEGATIVE
Propoxyphene: NEGATIVE

## 2013-02-17 LAB — URINALYSIS, ROUTINE W REFLEX MICROSCOPIC
Bilirubin Urine: NEGATIVE
Glucose, UA: NEGATIVE mg/dL
Hgb urine dipstick: NEGATIVE
Ketones, ur: NEGATIVE mg/dL
Leukocytes, UA: NEGATIVE
Nitrite: NEGATIVE
Protein, ur: NEGATIVE mg/dL
Specific Gravity, Urine: 1.019 (ref 1.005–1.030)
Urobilinogen, UA: 0.2 mg/dL (ref 0.0–1.0)
pH: 5.5 (ref 5.0–8.0)

## 2013-02-17 LAB — HEPATITIS B SURFACE ANTIGEN: Hepatitis B Surface Ag: NEGATIVE

## 2013-02-17 LAB — VARICELLA ZOSTER ANTIBODY, IGG: Varicella IgG: 1421 Index — ABNORMAL HIGH (ref <135.00)

## 2013-02-17 LAB — HIV ANTIBODY (ROUTINE TESTING W REFLEX): HIV: NONREACTIVE

## 2013-02-17 LAB — RPR

## 2013-02-17 LAB — URINE CULTURE
Colony Count: NO GROWTH
Organism ID, Bacteria: NO GROWTH

## 2013-02-17 LAB — ANTIBODY SCREEN: Antibody Screen: NEGATIVE

## 2013-02-17 LAB — RUBELLA SCREEN: Rubella: 1.7 Index — ABNORMAL HIGH (ref <0.90)

## 2013-02-17 LAB — OXYCODONE SCREEN, UA, RFLX CONFIRM: Oxycodone Screen, Ur: NEGATIVE ng/mL

## 2013-02-18 LAB — HEMOGLOBINOPATHY EVALUATION
Hemoglobin Other: 0 %
Hgb A2 Quant: 2.9 % (ref 2.2–3.2)
Hgb A: 63.2 % — ABNORMAL LOW (ref 96.8–97.8)
Hgb F Quant: 0 % (ref 0.0–2.0)
Hgb S Quant: 33.9 % — ABNORMAL HIGH

## 2013-03-15 ENCOUNTER — Telehealth: Payer: Self-pay | Admitting: *Deleted

## 2013-03-15 NOTE — Telephone Encounter (Signed)
Pt wants to when she will have her "sugar test." Pt informed Glucose tolerance test done at approximately 28 weeks. Pt also requesting results of HIV testing informed negative.

## 2013-03-18 ENCOUNTER — Other Ambulatory Visit: Payer: Self-pay | Admitting: Advanced Practice Midwife

## 2013-03-18 ENCOUNTER — Ambulatory Visit (INDEPENDENT_AMBULATORY_CARE_PROVIDER_SITE_OTHER): Payer: Medicaid Other | Admitting: Obstetrics & Gynecology

## 2013-03-18 ENCOUNTER — Encounter: Payer: Self-pay | Admitting: Obstetrics & Gynecology

## 2013-03-18 ENCOUNTER — Ambulatory Visit (INDEPENDENT_AMBULATORY_CARE_PROVIDER_SITE_OTHER): Payer: Medicaid Other

## 2013-03-18 VITALS — BP 100/60 | Wt 131.0 lb

## 2013-03-18 DIAGNOSIS — Z3480 Encounter for supervision of other normal pregnancy, unspecified trimester: Secondary | ICD-10-CM

## 2013-03-18 DIAGNOSIS — Z1389 Encounter for screening for other disorder: Secondary | ICD-10-CM

## 2013-03-18 DIAGNOSIS — O09299 Supervision of pregnancy with other poor reproductive or obstetric history, unspecified trimester: Secondary | ICD-10-CM

## 2013-03-18 DIAGNOSIS — O99019 Anemia complicating pregnancy, unspecified trimester: Secondary | ICD-10-CM

## 2013-03-18 DIAGNOSIS — Z331 Pregnant state, incidental: Secondary | ICD-10-CM

## 2013-03-18 DIAGNOSIS — O98519 Other viral diseases complicating pregnancy, unspecified trimester: Secondary | ICD-10-CM

## 2013-03-18 DIAGNOSIS — O99340 Other mental disorders complicating pregnancy, unspecified trimester: Secondary | ICD-10-CM

## 2013-03-18 NOTE — Progress Notes (Signed)
U/S(18+0wks)-single active fetus, meas c/w dates, fluid wnl, anterior Gr 0 placenta, cx appears closed (3.3cm), bilateral adnexa appears wnl, FHR- 151 bpm, female fetus, no major abnl noted

## 2013-03-18 NOTE — Progress Notes (Signed)
Sonogram reviewed and report done, all normal  BP weight and urine results all reviewed and noted. Patient reports good fetal movement, denies any bleeding and no rupture of membranes symptoms or regular contractions. Patient is without complaints. All questions were answered.   FU 4 weeks

## 2013-03-23 LAB — POCT URINALYSIS DIPSTICK
Glucose, UA: NEGATIVE
Protein, UA: NEGATIVE

## 2013-03-30 ENCOUNTER — Telehealth: Payer: Self-pay

## 2013-04-01 ENCOUNTER — Telehealth: Payer: Self-pay

## 2013-04-01 NOTE — Telephone Encounter (Signed)
No answer @ 5:11 pm. JSY

## 2013-04-02 ENCOUNTER — Telehealth: Payer: Self-pay | Admitting: Obstetrics and Gynecology

## 2013-04-02 DIAGNOSIS — Z3490 Encounter for supervision of normal pregnancy, unspecified, unspecified trimester: Secondary | ICD-10-CM

## 2013-04-02 DIAGNOSIS — B009 Herpesviral infection, unspecified: Secondary | ICD-10-CM

## 2013-04-02 NOTE — Telephone Encounter (Signed)
Pt stated that she had some spotting after sex. Pt advised that it can be normal to have some spotting dafter intercourse. Pt denies any bleeding or cramping now. Pt advised to push fluids and watch for bleeding, cramping or gush of fluid. Pt verbalized understanding.

## 2013-04-02 NOTE — Telephone Encounter (Signed)
Left message @ 11:22 am. JSY

## 2013-04-02 NOTE — Telephone Encounter (Signed)
Spoke with pt about this today.

## 2013-04-07 ENCOUNTER — Telehealth: Payer: Self-pay | Admitting: *Deleted

## 2013-04-07 DIAGNOSIS — Z3490 Encounter for supervision of normal pregnancy, unspecified, unspecified trimester: Secondary | ICD-10-CM

## 2013-04-07 DIAGNOSIS — B009 Herpesviral infection, unspecified: Secondary | ICD-10-CM

## 2013-04-07 NOTE — Telephone Encounter (Signed)
Pt called stating that she is having some constipation and has had some bleeding rectally, she thinks she may have a hemorrhoid. Pt wanted to know if she could use a suppository. I spoke with Knute Neu and she advised not to use the suppository but to try increasing her fluids and exercise, colace 1-2 daily, and miralax daily. Pt was advised of this and also to call back if no changes occur.   Pt verbalized understanding.

## 2013-04-15 ENCOUNTER — Encounter: Payer: Self-pay | Admitting: Obstetrics & Gynecology

## 2013-04-15 ENCOUNTER — Ambulatory Visit (INDEPENDENT_AMBULATORY_CARE_PROVIDER_SITE_OTHER): Payer: Medicaid Other | Admitting: Obstetrics & Gynecology

## 2013-04-15 VITALS — BP 110/80 | Wt 138.0 lb

## 2013-04-15 DIAGNOSIS — N760 Acute vaginitis: Secondary | ICD-10-CM

## 2013-04-15 DIAGNOSIS — O99019 Anemia complicating pregnancy, unspecified trimester: Secondary | ICD-10-CM

## 2013-04-15 DIAGNOSIS — O98519 Other viral diseases complicating pregnancy, unspecified trimester: Secondary | ICD-10-CM

## 2013-04-15 DIAGNOSIS — O2390 Unspecified genitourinary tract infection in pregnancy, unspecified trimester: Secondary | ICD-10-CM

## 2013-04-15 DIAGNOSIS — Z3480 Encounter for supervision of other normal pregnancy, unspecified trimester: Secondary | ICD-10-CM

## 2013-04-15 DIAGNOSIS — O99340 Other mental disorders complicating pregnancy, unspecified trimester: Secondary | ICD-10-CM

## 2013-04-15 DIAGNOSIS — O09299 Supervision of pregnancy with other poor reproductive or obstetric history, unspecified trimester: Secondary | ICD-10-CM

## 2013-04-15 LAB — POCT URINALYSIS DIPSTICK
Blood, UA: NEGATIVE
Glucose, UA: NEGATIVE
Ketones, UA: NEGATIVE
Leukocytes, UA: NEGATIVE
Nitrite, UA: NEGATIVE
Protein, UA: NEGATIVE

## 2013-04-15 MED ORDER — METRONIDAZOLE 500 MG PO TABS: 500.0000 mg | ORAL_TABLET | Freq: Two times a day (BID) | ORAL | Status: DC

## 2013-04-15 MED ORDER — BUTALBITAL-APAP-CAFFEINE 50-325-40 MG PO TABS: 1.0000 | ORAL_TABLET | Freq: Four times a day (QID) | ORAL | Status: DC | PRN

## 2013-04-15 NOTE — Progress Notes (Signed)
Vag discharge wet prep +BV  BP weight and urine results all reviewed and noted. Patient reports good fetal movement, denies any bleeding and no rupture of membranes symptoms or regular contractions. Patient is without complaints. All questions were answered.

## 2013-04-15 NOTE — Addendum Note (Signed)
Addended by: Farley Ly on: 04/15/2013 04:15 PM   Modules accepted: Orders

## 2013-04-19 ENCOUNTER — Other Ambulatory Visit: Payer: Medicaid Other

## 2013-04-19 DIAGNOSIS — Z3480 Encounter for supervision of other normal pregnancy, unspecified trimester: Secondary | ICD-10-CM

## 2013-04-22 LAB — MATERNAL SCREEN, INTEGRATED #2
AFP MoM: 1.98
AFP, Serum: 178.7 ng/mL
Age risk Down Syndrome: 1:810 {titer}
Calculated Gestational Age: 22.4
Crown Rump Length: 66.1 mm
Estriol Mom: 1.59
Estriol, Free: 4.04 ng/mL
Inhibin A Dimeric: 436 pg/mL
Inhibin A MoM: 2.04
MSS Down Syndrome: <1:5000 {titer}
MSS Trisomy 18 Risk: <1:5000 {titer}
NT MoM: 1.27
Nuchal Translucency: 1.88 mm
Number of fetuses: 1
PAPP-A MoM: 1.18
PAPP-A: 1852 ng/mL
Rish for ONTD: 1:340 {titer}
hCG MoM: 4.52
hCG, Serum: 82 IU/mL

## 2013-04-26 ENCOUNTER — Telehealth: Payer: Self-pay | Admitting: Obstetrics & Gynecology

## 2013-04-26 ENCOUNTER — Telehealth: Payer: Self-pay | Admitting: Obstetrics and Gynecology

## 2013-04-26 NOTE — Telephone Encounter (Signed)
Pt state yesterday at work (Works at [employer] as Scientist, water quality) left 2 hours early due to feeling dizzy and faint, only gets 15 min breaks and  states is not allowed to keep a bottle of water at register. Requesting a note stating needs to be able to have frequent breaks and be able to keep a bottle of water at register due to staying hydrated, due date, medication list, and weeks of pregnancy.

## 2013-04-26 NOTE — Telephone Encounter (Signed)
Spoke with pt letting her know note was ready to be picked up and she stated she had already gotten one from [employer]. [place]

## 2013-04-26 NOTE — Telephone Encounter (Signed)
Pt aware note left at front desk.

## 2013-04-26 NOTE — Telephone Encounter (Signed)
That is fine 

## 2013-04-28 ENCOUNTER — Encounter: Payer: Self-pay | Admitting: Obstetrics & Gynecology

## 2013-04-28 ENCOUNTER — Telehealth: Payer: Self-pay | Admitting: Obstetrics and Gynecology

## 2013-04-28 ENCOUNTER — Ambulatory Visit (INDEPENDENT_AMBULATORY_CARE_PROVIDER_SITE_OTHER): Payer: Medicaid Other | Admitting: Obstetrics & Gynecology

## 2013-04-28 ENCOUNTER — Telehealth: Payer: Self-pay | Admitting: *Deleted

## 2013-04-28 VITALS — BP 100/60 | Wt 137.2 lb

## 2013-04-28 DIAGNOSIS — O99340 Other mental disorders complicating pregnancy, unspecified trimester: Secondary | ICD-10-CM

## 2013-04-28 DIAGNOSIS — O99019 Anemia complicating pregnancy, unspecified trimester: Secondary | ICD-10-CM

## 2013-04-28 DIAGNOSIS — Z331 Pregnant state, incidental: Secondary | ICD-10-CM

## 2013-04-28 DIAGNOSIS — O98519 Other viral diseases complicating pregnancy, unspecified trimester: Secondary | ICD-10-CM

## 2013-04-28 DIAGNOSIS — Z1389 Encounter for screening for other disorder: Secondary | ICD-10-CM

## 2013-04-28 DIAGNOSIS — O09299 Supervision of pregnancy with other poor reproductive or obstetric history, unspecified trimester: Secondary | ICD-10-CM

## 2013-04-28 LAB — POCT URINALYSIS DIPSTICK
Blood, UA: NEGATIVE
Glucose, UA: NEGATIVE
Ketones, UA: 3
Leukocytes, UA: NEGATIVE
Nitrite, UA: NEGATIVE
Protein, UA: NEGATIVE

## 2013-04-28 MED ORDER — TERCONAZOLE 0.4 % VA CREA: 1.0000 | TOPICAL_CREAM | Freq: Every day | VAGINAL | Status: DC

## 2013-04-28 NOTE — Progress Notes (Signed)
+  yeast status post flagyl for BV  BP weight and urine results all reviewed and noted. Patient reports good fetal movement, denies any bleeding and no rupture of membranes symptoms or regular contractions. Patient is without complaints. All questions were answered.  PN2 next visit

## 2013-04-28 NOTE — Telephone Encounter (Signed)
Spoke with pt. Dr. Elonda Husky prescribed med for BV. Pt finished med, but noticed after taking med, a rash on stomach. + swelling, and itching in vaginal area. Used Vagisil cream. Pt still with symptoms. Call transferred to front desk for appt. Pt voiced understanding. [place]

## 2013-04-28 NOTE — Telephone Encounter (Signed)
Duplicate message. 

## 2013-05-10 ENCOUNTER — Other Ambulatory Visit: Payer: Medicaid Other

## 2013-05-10 ENCOUNTER — Ambulatory Visit (INDEPENDENT_AMBULATORY_CARE_PROVIDER_SITE_OTHER): Payer: Medicaid Other | Admitting: Obstetrics and Gynecology

## 2013-05-10 ENCOUNTER — Encounter: Payer: Self-pay | Admitting: Obstetrics and Gynecology

## 2013-05-10 VITALS — BP 108/60 | Wt 136.2 lb

## 2013-05-10 DIAGNOSIS — Z331 Pregnant state, incidental: Secondary | ICD-10-CM

## 2013-05-10 DIAGNOSIS — D759 Disease of blood and blood-forming organs, unspecified: Secondary | ICD-10-CM

## 2013-05-10 DIAGNOSIS — O99019 Anemia complicating pregnancy, unspecified trimester: Secondary | ICD-10-CM

## 2013-05-10 DIAGNOSIS — O98519 Other viral diseases complicating pregnancy, unspecified trimester: Secondary | ICD-10-CM

## 2013-05-10 DIAGNOSIS — Z1389 Encounter for screening for other disorder: Secondary | ICD-10-CM

## 2013-05-10 DIAGNOSIS — Z3480 Encounter for supervision of other normal pregnancy, unspecified trimester: Secondary | ICD-10-CM

## 2013-05-10 DIAGNOSIS — Z3490 Encounter for supervision of normal pregnancy, unspecified, unspecified trimester: Secondary | ICD-10-CM

## 2013-05-10 DIAGNOSIS — O99340 Other mental disorders complicating pregnancy, unspecified trimester: Secondary | ICD-10-CM

## 2013-05-10 LAB — POCT URINALYSIS DIPSTICK
Blood, UA: NEGATIVE
Glucose, UA: NEGATIVE
Ketones, UA: NEGATIVE
Leukocytes, UA: NEGATIVE
Nitrite, UA: NEGATIVE
Protein, UA: NEGATIVE

## 2013-05-10 LAB — CBC
HCT: 33.5 % — ABNORMAL LOW (ref 36.0–46.0)
Hemoglobin: 11.1 g/dL — ABNORMAL LOW (ref 12.0–15.0)
MCH: 27.6 pg (ref 26.0–34.0)
MCHC: 33.1 g/dL (ref 30.0–36.0)
MCV: 83.3 fL (ref 78.0–100.0)
Platelets: 227 10*3/uL (ref 150–400)
RBC: 4.02 MIL/uL (ref 3.87–5.11)
RDW: 14.3 % (ref 11.5–15.5)
WBC: 7.4 10*3/uL (ref 4.0–10.5)

## 2013-05-10 NOTE — Progress Notes (Signed)
[redacted]w[redacted]d [id number] female presents for routine prenatal visit today. She has no complaints at this time. She denies vaginal bleeding or discharge.

## 2013-05-11 LAB — GLUCOSE TOLERANCE, 2 HOURS W/ 1HR
Glucose, 1 hour: 89 mg/dL (ref 70–170)
Glucose, 2 hour: 71 mg/dL (ref 70–139)
Glucose, Fasting: 73 mg/dL (ref 70–99)

## 2013-05-11 LAB — RPR

## 2013-05-11 LAB — HIV ANTIBODY (ROUTINE TESTING W REFLEX): HIV 1&2 Ab, 4th Generation: NONREACTIVE

## 2013-05-11 LAB — ANTIBODY SCREEN: Antibody Screen: NEGATIVE

## 2013-05-11 LAB — HSV 2 ANTIBODY, IGG: HSV 2 Glycoprotein G Ab, IgG: 4.17 IV — ABNORMAL HIGH

## 2013-05-16 ENCOUNTER — Encounter: Payer: Self-pay | Admitting: Obstetrics & Gynecology

## 2013-05-21 ENCOUNTER — Ambulatory Visit (INDEPENDENT_AMBULATORY_CARE_PROVIDER_SITE_OTHER): Payer: Medicaid Other | Admitting: Obstetrics and Gynecology

## 2013-05-21 ENCOUNTER — Encounter: Payer: Self-pay | Admitting: Obstetrics and Gynecology

## 2013-05-21 ENCOUNTER — Telehealth: Payer: Self-pay | Admitting: *Deleted

## 2013-05-21 VITALS — BP 110/58 | HR 90

## 2013-05-21 DIAGNOSIS — G44209 Tension-type headache, unspecified, not intractable: Secondary | ICD-10-CM

## 2013-05-21 DIAGNOSIS — Z331 Pregnant state, incidental: Secondary | ICD-10-CM

## 2013-05-21 DIAGNOSIS — Z3490 Encounter for supervision of normal pregnancy, unspecified, unspecified trimester: Secondary | ICD-10-CM

## 2013-05-21 DIAGNOSIS — Z1389 Encounter for screening for other disorder: Secondary | ICD-10-CM

## 2013-05-21 LAB — POCT URINALYSIS DIPSTICK
Blood, UA: NEGATIVE
Glucose, UA: NEGATIVE
Ketones, UA: NEGATIVE
Nitrite, UA: NEGATIVE
Protein, UA: NEGATIVE

## 2013-05-21 MED ORDER — HYDROCODONE-ACETAMINOPHEN 5-325 MG PO TABS: 1.0000 | ORAL_TABLET | Freq: Four times a day (QID) | ORAL | Status: DC | PRN

## 2013-05-21 NOTE — Progress Notes (Signed)
Pt worked in today for blurred vision, headaches, and pain since Monday. Pt states that she has pain on the left side more than anywhere else.

## 2013-05-21 NOTE — Telephone Encounter (Signed)
Pt c/o blurred vision x 5 days, N/V, +FM. Pt states "has never felt like this with previous pregnancies." Pt informed to come in now to be evaluated. Pt verbalized understanding.

## 2013-05-21 NOTE — Progress Notes (Signed)
[redacted]w[redacted]d, [id number]. + blurred vision, left-sided headaches, palpitations and pain x7 days. Pt reports she has been taking Fioricet for tension HAs, however, the meds have worsened her Sx. Pt wishes to be switched from the [place]. Pt also complains of blood with whipping her rectum.   PE: Chaperone present for exam which was performed with pt's permission Abdomen: non-tender, uterus feels soft Rectal: No hemorrhoids  Pelvic: Cervix is long, closed and posterior  P:Switch from Fioricet to Vicodin for HAs

## 2013-06-05 ENCOUNTER — Encounter: Payer: Self-pay | Admitting: Women's Health

## 2013-06-07 ENCOUNTER — Ambulatory Visit (INDEPENDENT_AMBULATORY_CARE_PROVIDER_SITE_OTHER): Payer: Medicaid Other | Admitting: Women's Health

## 2013-06-07 ENCOUNTER — Encounter: Payer: Self-pay | Admitting: Women's Health

## 2013-06-07 VITALS — BP 100/52 | Wt 140.5 lb

## 2013-06-07 DIAGNOSIS — Z331 Pregnant state, incidental: Secondary | ICD-10-CM

## 2013-06-07 DIAGNOSIS — Z3480 Encounter for supervision of other normal pregnancy, unspecified trimester: Secondary | ICD-10-CM

## 2013-06-07 DIAGNOSIS — O2390 Unspecified genitourinary tract infection in pregnancy, unspecified trimester: Secondary | ICD-10-CM

## 2013-06-07 DIAGNOSIS — O479 False labor, unspecified: Secondary | ICD-10-CM

## 2013-06-07 DIAGNOSIS — N9089 Other specified noninflammatory disorders of vulva and perineum: Secondary | ICD-10-CM

## 2013-06-07 DIAGNOSIS — O09899 Supervision of other high risk pregnancies, unspecified trimester: Secondary | ICD-10-CM

## 2013-06-07 DIAGNOSIS — Z1389 Encounter for screening for other disorder: Secondary | ICD-10-CM

## 2013-06-07 DIAGNOSIS — O4700 False labor before 37 completed weeks of gestation, unspecified trimester: Secondary | ICD-10-CM

## 2013-06-07 LAB — POCT URINALYSIS DIPSTICK
Blood, UA: NEGATIVE
Glucose, UA: NEGATIVE
Ketones, UA: NEGATIVE
Leukocytes, UA: NEGATIVE
Nitrite, UA: NEGATIVE
Protein, UA: NEGATIVE

## 2013-06-07 LAB — POCT WET PREP (WET MOUNT): Clue Cells Wet Prep Whiff POC: NEGATIVE

## 2013-06-07 NOTE — Progress Notes (Signed)
Work-in today. Reports good fm. Denies lof, vb, uti s/s.  Not sleeping well, recommended developing bedtime routine, avoiding caffeine/exercise few hrs before bedtime, using lavender scented pillow/air freshener, unisom if needed. Cries a lot, denies SI/HI, taking celexa 20mg  daily, forgets some days, had been on 40mg  at one time- states she does not feel she needs to increase at this time, not currently in counseling, recommended re-initiating counseling w/ [facility]-states she 'doesn't want to get SW involved like last time and have them try to take my baby'- states she does not have custody of her other 3 children. Told her SW would be involved at hospital anyway d/t her hx of not having custody of any of her children, and that I thought counseling would be very beneficial for her and could also show that she is trying to seek care for her mental health issues- states she wants to discuss it w/ the fob first. Cramping and also has vaginal irritation when wiping- thinks she may be wiping too hard, when she works she is on her feet a lot and baby feels like he's pushing/cramping. Reports all 3 of her births were preterm. Chart indicates all full-term. Ext genitalia: no signs of irritation. SSE: large amount creamy white nonodorous d/c, cx visually closed. SVE: 1/th/-3, soft. Wet prep few wbc's, otherwise neg. Will check gc/ct today. Reviewed ptl s/s, fkc.  All questions answered. F/U in 24hrs for visit w/ fFN followed by [facility] u/s. Work note for pt to stay out today. Note given for fob to provide to his employer to possibly be able to come w/ her tomorrow to further discuss issues per pt request.

## 2013-06-07 NOTE — Patient Instructions (Signed)
Preterm Labor Information Preterm labor is when labor starts at less than 37 weeks of pregnancy. The normal length of a pregnancy is 39 to 41 weeks. CAUSES Often, there is no identifiable underlying cause as to why a woman goes into preterm labor. One of the most common known causes of preterm labor is infection. Infections of the uterus, cervix, vagina, amniotic sac, bladder, kidney, or even the lungs (pneumonia) can cause labor to start. Other suspected causes of preterm labor include:   Urogenital infections, such as yeast infections and bacterial vaginosis.   Uterine abnormalities (uterine shape, uterine septum, fibroids, or bleeding from the placenta).   A cervix that has been operated on (it may fail to stay closed).   Malformations in the fetus.   Multiple gestations (twins, triplets, and so on).   Breakage of the amniotic sac.  RISK FACTORS  Having a previous history of preterm labor.   Having premature rupture of membranes (PROM).   Having a placenta that covers the opening of the cervix (placenta previa).   Having a placenta that separates from the uterus (placental abruption).   Having a cervix that is too weak to hold the fetus in the uterus (incompetent cervix).   Having too much fluid in the amniotic sac (polyhydramnios).   Taking illegal drugs or smoking while pregnant.   Not gaining enough weight while pregnant.   Being younger than 18 and older than 29 years old.   Having a low socioeconomic status.   Being African American. SYMPTOMS Signs and symptoms of preterm labor include:   Menstrual-like cramps, abdominal pain, or back pain.  Uterine contractions that are regular, as frequent as six in an hour, regardless of their intensity (may be mild or painful).  Contractions that start on the top of the uterus and spread down to the lower abdomen and back.   A sense of increased pelvic pressure.   A watery or bloody mucus discharge that  comes from the vagina.  TREATMENT Depending on the length of the pregnancy and other circumstances, your health care provider may suggest bed rest. If necessary, there are medicines that can be given to stop contractions and to mature the fetal lungs. If labor happens before 34 weeks of pregnancy, a prolonged hospital stay may be recommended. Treatment depends on the condition of both you and the fetus.  WHAT SHOULD YOU DO IF YOU THINK YOU ARE IN PRETERM LABOR? Call your health care provider right away. You will need to go to the hospital to get checked immediately. HOW CAN YOU PREVENT PRETERM LABOR IN FUTURE PREGNANCIES? You should:   Stop smoking if you smoke.  Maintain healthy weight gain and avoid chemicals and drugs that are not necessary.  Be watchful for any type of infection.  Inform your health care provider if you have a known history of preterm labor. Document Released: 03/16/2003 Document Revised: 08/26/2012 Document Reviewed: 01/27/2012 Exit[employer] Patient Information 2014 [employer].    

## 2013-06-08 ENCOUNTER — Ambulatory Visit (INDEPENDENT_AMBULATORY_CARE_PROVIDER_SITE_OTHER): Payer: Medicaid Other

## 2013-06-08 ENCOUNTER — Encounter: Payer: Self-pay | Admitting: Women's Health

## 2013-06-08 ENCOUNTER — Ambulatory Visit (INDEPENDENT_AMBULATORY_CARE_PROVIDER_SITE_OTHER): Payer: Medicaid Other | Admitting: Women's Health

## 2013-06-08 ENCOUNTER — Other Ambulatory Visit: Payer: Self-pay | Admitting: Women's Health

## 2013-06-08 ENCOUNTER — Encounter: Payer: Self-pay | Admitting: Obstetrics and Gynecology

## 2013-06-08 VITALS — BP 110/62 | Wt 140.6 lb

## 2013-06-08 DIAGNOSIS — Z3480 Encounter for supervision of other normal pregnancy, unspecified trimester: Secondary | ICD-10-CM

## 2013-06-08 DIAGNOSIS — O4700 False labor before 37 completed weeks of gestation, unspecified trimester: Secondary | ICD-10-CM

## 2013-06-08 DIAGNOSIS — O26899 Other specified pregnancy related conditions, unspecified trimester: Secondary | ICD-10-CM

## 2013-06-08 DIAGNOSIS — O26879 Cervical shortening, unspecified trimester: Secondary | ICD-10-CM

## 2013-06-08 DIAGNOSIS — O0990 Supervision of high risk pregnancy, unspecified, unspecified trimester: Secondary | ICD-10-CM

## 2013-06-08 DIAGNOSIS — R109 Unspecified abdominal pain: Secondary | ICD-10-CM

## 2013-06-08 DIAGNOSIS — Z331 Pregnant state, incidental: Secondary | ICD-10-CM

## 2013-06-08 DIAGNOSIS — O479 False labor, unspecified: Secondary | ICD-10-CM

## 2013-06-08 DIAGNOSIS — O09899 Supervision of other high risk pregnancies, unspecified trimester: Secondary | ICD-10-CM

## 2013-06-08 DIAGNOSIS — Z1389 Encounter for screening for other disorder: Secondary | ICD-10-CM

## 2013-06-08 LAB — POCT URINALYSIS DIPSTICK
Blood, UA: NEGATIVE
Glucose, UA: NEGATIVE
Ketones, UA: NEGATIVE
Leukocytes, UA: NEGATIVE
Nitrite, UA: NEGATIVE
Protein, UA: NEGATIVE

## 2013-06-08 LAB — GC/CHLAMYDIA PROBE AMP
CT Probe RNA: NEGATIVE
GC Probe RNA: NEGATIVE

## 2013-06-08 MED ORDER — PROGESTERONE MICRONIZED 200 MG PO CAPS: 200.0000 mg | ORAL_CAPSULE | Freq: Every day | ORAL | Status: DC

## 2013-06-08 MED ORDER — BETAMETHASONE SOD PHOS & ACET 6 (3-3) MG/ML IJ SUSP
12.0000 mg | Freq: Once | INTRAMUSCULAR | Status: AC
  Administered 2013-06-08: 12 mg via INTRAMUSCULAR

## 2013-06-08 NOTE — Progress Notes (Signed)
Here for 1st BMZ and monitoring for uc's. Reports good fm. Denies lof, vb, uti s/s.  Feeling some occ tightening. NST reactive, lying on side- occ inverted uc's on efm. Mild to palpation. Strip reviewed w/ [facility]- doesn't feel she needs further eval at [facility] at this time.   Reviewed ptl s/s, fkc, reasons to go to [facility].  All questions answered. F/U in 24hrs for 2nd BMZ, then 1wk for repeat CL u/s and visit.  To start prometrium tonight.

## 2013-06-08 NOTE — Patient Instructions (Signed)
Call the office ([phone]) or go to [facility] if:  You begin to have strong, frequent contractions  Your water breaks.  Sometimes it is a big gush of fluid, sometimes it is just a trickle that keeps getting your panties wet or running down your legs  You have vaginal bleeding.  It is normal to have a small amount of spotting if your cervix was checked.   You don't feel your baby moving like normal.  If you don't, get you something to eat and drink and lay down and focus on feeling your baby move.  You should feel at least 10 movements in 2 hours.  If you don't, you should call the office or go to [facility].    Preterm Labor Information Preterm labor is when labor starts at less than 37 weeks of pregnancy. The normal length of a pregnancy is 39 to 41 weeks. CAUSES Often, there is no identifiable underlying cause as to why a woman goes into preterm labor. One of the most common known causes of preterm labor is infection. Infections of the uterus, cervix, vagina, amniotic sac, bladder, kidney, or even the lungs (pneumonia) can cause labor to start. Other suspected causes of preterm labor include:   Urogenital infections, such as yeast infections and bacterial vaginosis.   Uterine abnormalities (uterine shape, uterine septum, fibroids, or bleeding from the placenta).   A cervix that has been operated on (it may fail to stay closed).   Malformations in the fetus.   Multiple gestations (twins, triplets, and so on).   Breakage of the amniotic sac.  RISK FACTORS  Having a previous history of preterm labor.   Having premature rupture of membranes (PROM).   Having a placenta that covers the opening of the cervix (placenta previa).   Having a placenta that separates from the uterus (placental abruption).   Having a cervix that is too weak to hold the fetus in the uterus (incompetent cervix).   Having too much fluid in the amniotic sac (polyhydramnios).   Taking  illegal drugs or smoking while pregnant.   Not gaining enough weight while pregnant.   Being younger than 18 and older than 29 years old.   Having a low socioeconomic status.   Being African American. SYMPTOMS Signs and symptoms of preterm labor include:   Menstrual-like cramps, abdominal pain, or back pain.  Uterine contractions that are regular, as frequent as six in an hour, regardless of their intensity (may be mild or painful).  Contractions that start on the top of the uterus and spread down to the lower abdomen and back.   A sense of increased pelvic pressure.   A watery or bloody mucus discharge that comes from the vagina.  TREATMENT Depending on the length of the pregnancy and other circumstances, your health care provider may suggest bed rest. If necessary, there are medicines that can be given to stop contractions and to mature the fetal lungs. If labor happens before 34 weeks of pregnancy, a prolonged hospital stay may be recommended. Treatment depends on the condition of both you and the fetus.  WHAT SHOULD YOU DO IF YOU THINK YOU ARE IN PRETERM LABOR? Call your health care provider right away. You will need to go to the hospital to get checked immediately. HOW CAN YOU PREVENT PRETERM LABOR IN FUTURE PREGNANCIES? You should:   Stop smoking if you smoke.  Maintain healthy weight gain and avoid chemicals and drugs that are not necessary.  Be watchful for   type of infection.  Inform your health care provider if you have a known history of preterm labor. Document Released: 03/16/2003 Document Revised: 08/26/2012 Document Reviewed: 01/27/2012 ExitCare Patient Information 2014 [employer].  

## 2013-06-08 NOTE — Patient Instructions (Addendum)
Call the office ([phone]) or go to [facility] if:  You begin to have strong, frequent contractions  Your water breaks.  Sometimes it is a big gush of fluid, sometimes it is just a trickle that keeps getting your panties wet or running down your legs  You have vaginal bleeding.  It is normal to have a small amount of spotting if your cervix was checked.   You don't feel your baby moving like normal.  If you don't, get you something to eat and drink and lay down and focus on feeling your baby move.  You should feel at least 10 movements in 2 hours.  If you don't, you should call the office or go to [facility].    Preterm Labor Information Preterm labor is when labor starts at less than 37 weeks of pregnancy. The normal length of a pregnancy is 39 to 41 weeks. CAUSES Often, there is no identifiable underlying cause as to why a woman goes into preterm labor. One of the most common known causes of preterm labor is infection. Infections of the uterus, cervix, vagina, amniotic sac, bladder, kidney, or even the lungs (pneumonia) can cause labor to start. Other suspected causes of preterm labor include:   Urogenital infections, such as yeast infections and bacterial vaginosis.   Uterine abnormalities (uterine shape, uterine septum, fibroids, or bleeding from the placenta).   A cervix that has been operated on (it may fail to stay closed).   Malformations in the fetus.   Multiple gestations (twins, triplets, and so on).   Breakage of the amniotic sac.  RISK FACTORS  Having a previous history of preterm labor.   Having premature rupture of membranes (PROM).   Having a placenta that covers the opening of the cervix (placenta previa).   Having a placenta that separates from the uterus (placental abruption).   Having a cervix that is too weak to hold the fetus in the uterus (incompetent cervix).   Having too much fluid in the amniotic sac (polyhydramnios).   Taking  illegal drugs or smoking while pregnant.   Not gaining enough weight while pregnant.   Being younger than 18 and older than 29 years old.   Having a low socioeconomic status.   Being African American. SYMPTOMS Signs and symptoms of preterm labor include:   Menstrual-like cramps, abdominal pain, or back pain.  Uterine contractions that are regular, as frequent as six in an hour, regardless of their intensity (may be mild or painful).  Contractions that start on the top of the uterus and spread down to the lower abdomen and back.   A sense of increased pelvic pressure.   A watery or bloody mucus discharge that comes from the vagina.  TREATMENT Depending on the length of the pregnancy and other circumstances, your health care provider may suggest bed rest. If necessary, there are medicines that can be given to stop contractions and to mature the fetal lungs. If labor happens before 34 weeks of pregnancy, a prolonged hospital stay may be recommended. Treatment depends on the condition of both you and the fetus.  WHAT SHOULD YOU DO IF YOU THINK YOU ARE IN PRETERM LABOR? Call your health care provider right away. You will need to go to the hospital to get checked immediately. HOW CAN YOU PREVENT PRETERM LABOR IN FUTURE PREGNANCIES? You should:   Stop smoking if you smoke.  Maintain healthy weight gain and avoid chemicals and drugs that are not necessary.  Be watchful for   type of infection.  Inform your health care provider if you have a known history of preterm labor. Document Released: 03/16/2003 Document Revised: 08/26/2012 Document Reviewed: 01/27/2012 [facility] Patient Information 2014 [place], [place].  Third Trimester of Pregnancy The third trimester is from week 29 through week 42, months 7 through 9. The third trimester is a time when the fetus is growing rapidly. At the end of the ninth month, the fetus is about 20 inches in length and weighs 6 10 pounds.  BODY  CHANGES Your body goes through many changes during pregnancy. The changes vary from woman to woman.   Your weight will continue to increase. You can expect to gain 25 35 pounds (11 16 kg) by the end of the pregnancy.  You may begin to get stretch marks on your hips, abdomen, and breasts.  You may urinate more often because the fetus is moving lower into your pelvis and pressing on your bladder.  You may develop or continue to have heartburn as a result of your pregnancy.  You may develop constipation because certain hormones are causing the muscles that push waste through your intestines to slow down.  You may develop hemorrhoids or swollen, bulging veins (varicose veins).  You may have pelvic pain because of the weight gain and pregnancy hormones relaxing your joints between the bones in your pelvis. Back aches may result from over exertion of the muscles supporting your posture.  Your breasts will continue to grow and be tender. A yellow discharge may leak from your breasts called colostrum.  Your belly button may stick out.  You may feel short of breath because of your expanding uterus.  You may notice the fetus "dropping," or moving lower in your abdomen.  You may have a bloody mucus discharge. This usually occurs a few days to a week before labor begins.  Your cervix becomes thin and soft (effaced) near your due date. WHAT TO EXPECT AT YOUR PRENATAL EXAMS  You will have prenatal exams every 2 weeks until week 36. Then, you will have weekly prenatal exams. During a routine prenatal visit:  You will be weighed to make sure you and the fetus are growing normally.  Your blood pressure is taken.  Your abdomen will be measured to track your baby's growth.  The fetal heartbeat will be listened to.  Any test results from the previous visit will be discussed.  You may have a cervical check near your due date to see if you have effaced. At around 36 weeks, your caregiver will  check your cervix. At the same time, your caregiver will also perform a test on the secretions of the vaginal tissue. This test is to determine if a type of bacteria, Group B streptococcus, is present. Your caregiver will explain this further. Your caregiver may ask you:  What your birth plan is.  How you are feeling.  If you are feeling the baby move.  If you have had any abnormal symptoms, such as leaking fluid, bleeding, severe headaches, or abdominal cramping.  If you have any questions. Other tests or screenings that may be performed during your third trimester include:  Blood tests that check for low iron levels (anemia).  Fetal testing to check the health, activity level, and growth of the fetus. Testing is done if you have certain medical conditions or if there are problems during the pregnancy. FALSE LABOR You may feel small, irregular contractions that eventually go away. These are called Braxton Hicks contractions, or false  labor. Contractions may last for hours, days, or even weeks before true labor sets in. If contractions come at regular intervals, intensify, or become painful, it is best to be seen by your caregiver.  SIGNS OF LABOR   Menstrual-like cramps.  Contractions that are 5 minutes apart or less.  Contractions that start on the top of the uterus and spread down to the lower abdomen and back.  A sense of increased pelvic pressure or back pain.  A watery or bloody mucus discharge that comes from the vagina. If you have any of these signs before the 37th week of pregnancy, call your caregiver right away. You need to go to the hospital to get checked immediately. HOME CARE INSTRUCTIONS   Avoid all smoking, herbs, alcohol, and unprescribed drugs. These chemicals affect the formation and growth of the baby.  Follow your caregiver's instructions regarding medicine use. There are medicines that are either safe or unsafe to take during pregnancy.  Exercise only as  directed by your caregiver. Experiencing uterine cramps is a good sign to stop exercising.  Continue to eat regular, healthy meals.  Wear a good support bra for breast tenderness.  Do not use hot tubs, steam rooms, or saunas.  Wear your seat belt at all times when driving.  Avoid raw meat, uncooked cheese, cat litter boxes, and soil used by cats. These carry germs that can cause birth defects in the baby.  Take your prenatal vitamins.  Try taking a stool softener (if your caregiver approves) if you develop constipation. Eat more high-fiber foods, such as fresh vegetables or fruit and whole grains. Drink plenty of fluids to keep your urine clear or pale yellow.  Take warm sitz baths to soothe any pain or discomfort caused by hemorrhoids. Use hemorrhoid cream if your caregiver approves.  If you develop varicose veins, wear support hose. Elevate your feet for 15 minutes, 3 4 times a day. Limit salt in your diet.  Avoid heavy lifting, wear low heal shoes, and practice good posture.  Rest a lot with your legs elevated if you have leg cramps or low back pain.  Visit your dentist if you have not gone during your pregnancy. Use a soft toothbrush to brush your teeth and be gentle when you floss.  A sexual relationship may be continued unless your caregiver directs you otherwise.  Do not travel far distances unless it is absolutely necessary and only with the approval of your caregiver.  Take prenatal classes to understand, practice, and ask questions about the labor and delivery.  Make a trial run to the hospital.  Pack your hospital bag.  Prepare the baby's nursery.  Continue to go to all your prenatal visits as directed by your caregiver. SEEK MEDICAL CARE IF:  You are unsure if you are in labor or if your water has broken.  You have dizziness.  You have mild pelvic cramps, pelvic pressure, or nagging pain in your abdominal area.  You have persistent nausea, vomiting, or  diarrhea.  You have a bad smelling vaginal discharge.  You have pain with urination. SEEK IMMEDIATE MEDICAL CARE IF:   You have a fever.  You are leaking fluid from your vagina.  You have spotting or bleeding from your vagina.  You have severe abdominal cramping or pain.  You have rapid weight loss or gain.  You have shortness of breath with chest pain.  You notice sudden or extreme swelling of your face, hands, ankles, feet, or legs.  You  have not felt your baby move in over an hour.  You have severe headaches that do not go away with medicine.  You have vision changes. Document Released: 12/18/2000 Document Revised: 08/26/2012 Document Reviewed: 02/25/2012 [facility] Patient Information 2014 [place].

## 2013-06-08 NOTE — Progress Notes (Signed)
Here for fFN and to further discuss mental health counseling. Reviewed records after her visit yesterday, all births were full-term by hospital records. GC/CT yesterday -/-. Reports good fm. Denies lof, vb, uti s/s. Some cramping, more on Lt side. No sex/anything in vagina in last 24hrs, fFN collected. FOB present today, discussed my recommendation of wanting Chioma to begin counseling at [facility] for her depression and other mental health issues, notified them I am not in any way trying to get her baby taken away from her- but feel that counseling would benefit her and would show the SW who will be involved at hospital that she is making good efforts, along with the fact that she is employed and currently attending GED classes, to keep custody of this baby. Pt/FOB agree to counseling, referral sent to [facility]. Getting CL u/s today after visit w/ me. Reviewed ptl s/s, fkc.  All questions answered.  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 Recommended at the least having a stool to sit on while working, and not lifting heavy items- note given. Rx prometrium 200mg  pv q hs. Pelvic rest. Increase po fluids, empty bladder frequently. F/U 1wk for visit w/ CL u/s. fFN pending, plan for BMZ if +. Will discuss w/ JVF to see if any other interventions needed at this time. Discussed via phone w/ JVF after pt left, who recommends pt return today & tomorrow for BMZ, and to be put on efm today to monitor for uc's- if present, send to [facility] for further evaluation. Pt notified via phone, and will return.

## 2013-06-08 NOTE — Progress Notes (Signed)
U/S(29+5wks)-CX length only- vtx active fetus, cx-1.4cm closed (measured vaginally)

## 2013-06-09 ENCOUNTER — Ambulatory Visit: Payer: Medicaid Other

## 2013-06-09 ENCOUNTER — Telehealth: Payer: Self-pay | Admitting: *Deleted

## 2013-06-09 ENCOUNTER — Encounter (HOSPITAL_COMMUNITY): Payer: Self-pay | Admitting: *Deleted

## 2013-06-09 ENCOUNTER — Inpatient Hospital Stay (HOSPITAL_COMMUNITY)
Admission: AD | Admit: 2013-06-09 | Discharge: 2013-06-09 | Disposition: A | Discharge status: Home / Self Care | Payer: Medicaid Other | Source: Ambulatory Visit | Attending: Family Medicine | Admitting: Family Medicine

## 2013-06-09 DIAGNOSIS — R109 Unspecified abdominal pain: Secondary | ICD-10-CM | POA: Insufficient documentation

## 2013-06-09 DIAGNOSIS — O26879 Cervical shortening, unspecified trimester: Secondary | ICD-10-CM

## 2013-06-09 DIAGNOSIS — O0990 Supervision of high risk pregnancy, unspecified, unspecified trimester: Secondary | ICD-10-CM

## 2013-06-09 DIAGNOSIS — R102 Pelvic and perineal pain: Secondary | ICD-10-CM

## 2013-06-09 DIAGNOSIS — B009 Herpesviral infection, unspecified: Secondary | ICD-10-CM

## 2013-06-09 DIAGNOSIS — O26899 Other specified pregnancy related conditions, unspecified trimester: Secondary | ICD-10-CM

## 2013-06-09 DIAGNOSIS — O4700 False labor before 37 completed weeks of gestation, unspecified trimester: Secondary | ICD-10-CM | POA: Insufficient documentation

## 2013-06-09 LAB — FETAL FIBRONECTIN: Fetal Fibronectin: NEGATIVE

## 2013-06-09 LAB — URINALYSIS, ROUTINE W REFLEX MICROSCOPIC
Bilirubin Urine: NEGATIVE
Glucose, UA: NEGATIVE mg/dL
Hgb urine dipstick: NEGATIVE
Ketones, ur: 40 mg/dL — AB
Leukocytes, UA: NEGATIVE
Nitrite: NEGATIVE
Protein, ur: NEGATIVE mg/dL
Specific Gravity, Urine: 1.01 (ref 1.005–1.030)
Urobilinogen, UA: 0.2 mg/dL (ref 0.0–1.0)
pH: 6 (ref 5.0–8.0)

## 2013-06-09 LAB — WET PREP, GENITAL
Clue Cells Wet Prep HPF POC: NONE SEEN
Trich, Wet Prep: NONE SEEN

## 2013-06-09 MED ORDER — LACTATED RINGERS IV BOLUS (SEPSIS)
1000.0000 mL | Freq: Once | INTRAVENOUS | Status: AC
  Administered 2013-06-09: 1000 mL via INTRAVENOUS

## 2013-06-09 MED ORDER — OXYCODONE-ACETAMINOPHEN 5-325 MG PO TABS
1.0000 | ORAL_TABLET | Freq: Once | ORAL | Status: AC
  Administered 2013-06-09: 1 via ORAL
  Filled 2013-06-09: qty 1

## 2013-06-09 MED ORDER — BETAMETHASONE SOD PHOS & ACET 6 (3-3) MG/ML IJ SUSP
12.0000 mg | Freq: Once | INTRAMUSCULAR | Status: AC
  Administered 2013-06-09: 12 mg via INTRAMUSCULAR
  Filled 2013-06-09: qty 2

## 2013-06-09 NOTE — MAU Provider Note (Signed)
Chief Complaint:  Contractions   Jennifer Barber is a 28 y.o.  [phone] with IUP at [redacted]w[redacted]d presenting for Contractions  Pt started contracting last night.  Was seen yesterday at [facility], cervix was 1/thick/high  and had a CL checked that was 1.4cm and closed.  She had been complaining of intermittent contractions at that time and was given BMZ #1 and FFN was checked and was negative.  She was started on prometrium. Last night her pain worsened.  Says it doesn't seem to come and go but is a more constant suprapubic pain that is worsened by being in the same position for a prolonged period of time.  Occasionally some tightening of her upper abdomen but it is the suprapubic pain that bothers her more.    No lof, vb. +FM.  She did have some thick mucous discharge this am when she woke up but none since and nothing she describes as watery.    All previous term deliveries. She does not have custody of her other children.     Menstrual History: OB History   Grav Para Term Preterm Abortions TAB SAB Ect Mult Living   5 3 3  0 1 0 1 0 0 3        No LMP recorded. Patient is pregnant.      Past Medical History  Diagnosis Date  . Abnormal Pap smear   . Anemia   . Scoliosis   . Sickle cell trait   . Blood dyscrasia     Sickle Cell Trait  . Mental disorder     bipolar, schizophrenia  . Bipolar 1 disorder   . Schizophrenia   . Depression     Past Surgical History  Procedure Laterality Date  . Laparoscopic cholecystectomy      during second pregnancy  . Femoral osteotomy w/ rodding  2004    s/p accident , leg fx    Family History  Problem Relation Age of Onset  . Cancer Mother   . Kidney disease Mother   . Alcohol abuse Mother   . Mental illness Mother   . Cancer Maternal Grandmother     History  Substance Use Topics  . Smoking status: Former Smoker -- 0.02 packs/day for 11 years    Types: Cigarettes  . Smokeless tobacco: Never Used  . Alcohol Use: No     Comment: pt denies  smoking, drinking or drug use      Allergies  Allergen Reactions  . Bee Venom Swelling  . Codeine   . Latex Rash    Prescriptions prior to admission  Medication Sig Dispense Refill  . acetaminophen (TYLENOL) 500 MG tablet Take 500 mg by mouth every 6 (six) hours as needed.      . butalbital-acetaminophen-caffeine (FIORICET) 50-325-40 MG per tablet Take 1-2 tablets by mouth every 6 (six) hours as needed for headache.  20 tablet  1  . citalopram (CELEXA) 20 MG tablet Take 1 tablet (20 mg total) by mouth daily.  30 tablet  2  . ferrous sulfate (FERROUSUL) 325 (65 FE) MG tablet Take 1 tablet (325 mg total) by mouth 3 (three) times daily with meals.  30 tablet  3  . HYDROcodone-acetaminophen (NORCO/VICODIN) 5-325 MG per tablet Take 1 tablet by mouth every 6 (six) hours as needed.  30 tablet  0  . Prenatal Vit-Fe Fumarate-FA (MULTIVITAMIN-PRENATAL) 27-0.8 MG TABS tablet Take 1 tablet by mouth daily at 12 noon.      . progesterone (PROMETRIUM) 200 MG  capsule Place 1 capsule (200 mg total) vaginally at bedtime.  30 capsule  5    Review of Systems - Negative except for what is mentioned in HPI.  Physical Exam  Blood pressure 110/95, pulse 90, temperature 98.1 F (36.7 C), temperature source Oral, resp. rate 18, SpO2 100.00%. GENERAL: Well-developed, well-nourished female in no acute distress.  LUNGS: Clear to auscultation bilaterally.  HEART: Regular rate and rhythm. ABDOMEN: Soft, slight tenderness with palpation suprapubically without rebound or guarding, nondistended, gravid.  EXTREMITIES: Nontender, no edema, 2+ distal pulses. Cervical Exam: Dilatation 1cm   Effacement 50%   Station -3   Presentation: cephalic FHT:  Baseline rate 135 bpm   Variability moderate  Accelerations present   Decelerations none Contractions: initially every 4-5 min but not felt by pt and now improved to just irritability.    Labs: No results found for this or any previous visit (from the past 24  hour(s)).  Imaging Studies:  US Ob Transvaginal  06/08/2013   .FOLLOW UP SONOGRAM (Cx Length Only)   Jennifer Barber is in the office for a follow up sonogram for cervical  length.  She is a 28 y.o. year old [id number] with Estimated Date of Delivery: 08/19/13   now at  [redacted]w[redacted]d weeks gestation. Thus far the pregnancy has been  complicated by preterm contractions.  GESTATION:SINGLETON  PRESENTATION:cephalic  CERVIX: Measures 1.4 cm (measured vaginally)  SUSPECTED ABNORMALITIES:  shortened cervical length  QUALITY OF SCAN: satisfactory  TECHNICIAN COMMENTS:  U/S(29+5wks)-CX length only- vtx active fetus, cx-1.4cm closed (measured  vaginally)     A copy of this report including all images has been saved and backed up to  a second source for retrieval if needed. All measures and details of the  anatomical scan, placentation, fluid volume and pelvic anatomy are  contained in that report.  Jennifer Barber 06/08/2013 10:21 AM  Clinical Impression and recommendations:  I have reviewed the sonogram results above, combined with the patient's  current clinical course, below are my impressions and any appropriate  recommendations for management based on the sonographic findings.  --SHORTENED CERVICAL LENGTH, 1.42 cm below an area of funnelling,  1.  [id number] Estimated Date of Delivery: 08/19/13 by  early ultrasound and  confirmed by today's sonographic dating 2.  Normal fetal sonographic findings, 3.  Shortened cervical length : recommend initiation of Prometrium  and  Betamethasone. Discussed by Phone with Jennifer Barber CNM, who will initiate  Tx and followup on Fetal Fibronectin collected today.     Consider external fetal monitoring to r/o occult PTL.  Recommend increased frequency prenatal care based on this sonogram  as  clinically indicated  Jennifer Barber 06/08/2013 11:55 AM      Assessment: Jennifer Barber is  28 y.o. [id number] at [redacted]w[redacted]d presents with Contractions . Neg FFN in clinic yesterday. Incidentally found shortened cervix  to 1.4cm on [place]. Was given bmz x1 yesterday.   Plan: 1) contractions - given bolus of fluid with marked improvement in contractions on TOCO - SVE unchanged from exam yesterday and unchanged here after 3 hours.  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   2) suprapubic pain - UA neg for infection - pt with hx of pain and on chronic norco. Given dose of percocet here with improvement.   3) FWB- reassuring for GA  D/c to home and f/u as scheduled  with [facility].    Zakariya Knickerbocker L Braxdon Gappa 6/3/201512:33 PM

## 2013-06-09 NOTE — MAU Note (Signed)
was seen yesterday in office , put on medication for PTL, fFN sent. Given steroids, was to get 2nd dose today.  Started contracting last night.  Hx of PTL with other preg.

## 2013-06-09 NOTE — MAU Note (Signed)
C/o ucs since 0500; is taking prometrium; suppose to get her 2nd betamethasone shot in the office @ 1600; brought in by EMS;

## 2013-06-09 NOTE — Telephone Encounter (Signed)
Pt called the office and stated that she was having really bad pain in her left side that lasted for 3 and then went away for 15 minutes, and came back. Pt has appointment for 2nd BMZ injection today. Pt stated that she is having a thick white discharge and the pain was coming back. Pt stated that she had good baby movement and denied any bleeding or gush of fluid.   I spoke with Knute Neu about above and she advised that the pt should go to [facility] ASAP for evaluation due short cervix. I advised the pt of this and she verbalized understanding.

## 2013-06-09 NOTE — Discharge Instructions (Signed)
Preterm Labor Information Preterm labor is when labor starts at less than 37 weeks of pregnancy. The normal length of a pregnancy is 39 to 41 weeks. CAUSES Often, there is no identifiable underlying cause as to why a woman goes into preterm labor. One of the most common known causes of preterm labor is infection. Infections of the uterus, cervix, vagina, amniotic sac, bladder, kidney, or even the lungs (pneumonia) can cause labor to start. Other suspected causes of preterm labor include:   Urogenital infections, such as yeast infections and bacterial vaginosis.   Uterine abnormalities (uterine shape, uterine septum, fibroids, or bleeding from the placenta).   A cervix that has been operated on (it may fail to stay closed).   Malformations in the fetus.   Multiple gestations (twins, triplets, and so on).   Breakage of the amniotic sac.  RISK FACTORS  Having a previous history of preterm labor.   Having premature rupture of membranes (PROM).   Having a placenta that covers the opening of the cervix (placenta previa).   Having a placenta that separates from the uterus (placental abruption).   Having a cervix that is too weak to hold the fetus in the uterus (incompetent cervix).   Having too much fluid in the amniotic sac (polyhydramnios).   Taking illegal drugs or smoking while pregnant.   Not gaining enough weight while pregnant.   Being younger than 18 and older than 29 years old.   Having a low socioeconomic status.   Being African American. SYMPTOMS Signs and symptoms of preterm labor include:   Menstrual-like cramps, abdominal pain, or back pain.  Uterine contractions that are regular, as frequent as six in an hour, regardless of their intensity (may be mild or painful).  Contractions that start on the top of the uterus and spread down to the lower abdomen and back.   A sense of increased pelvic pressure.   A watery or bloody mucus discharge that  comes from the vagina.  TREATMENT Depending on the length of the pregnancy and other circumstances, your health care provider may suggest bed rest. If necessary, there are medicines that can be given to stop contractions and to mature the fetal lungs. If labor happens before 34 weeks of pregnancy, a prolonged hospital stay may be recommended. Treatment depends on the condition of both you and the fetus.  WHAT SHOULD YOU DO IF YOU THINK YOU ARE IN PRETERM LABOR? Call your health care provider right away. You will need to go to the hospital to get checked immediately. HOW CAN YOU PREVENT PRETERM LABOR IN FUTURE PREGNANCIES? You should:   Stop smoking if you smoke.  Maintain healthy weight gain and avoid chemicals and drugs that are not necessary.  Be watchful for any type of infection.  Inform your health care provider if you have a known history of preterm labor. Document Released: 03/16/2003 Document Revised: 08/26/2012 Document Reviewed: 01/27/2012 [employer] Patient Information 2014 [employer].    

## 2013-06-10 LAB — GC/CHLAMYDIA PROBE AMP
CT Probe RNA: NEGATIVE
GC Probe RNA: NEGATIVE

## 2013-06-10 NOTE — MAU Provider Note (Signed)
Attestation of Attending Supervision of Advanced Practitioner (PA/CNM/NP): Evaluation and management procedures were performed by the Advanced Practitioner under my supervision and collaboration.  I have reviewed the Advanced Practitioner's note and chart, and I agree with the management and plan.  Donnamae Jude, MD [facility] Attending 06/10/2013 9:24 AM

## 2013-06-15 ENCOUNTER — Encounter: Payer: Self-pay | Admitting: Women's Health

## 2013-06-15 ENCOUNTER — Ambulatory Visit (INDEPENDENT_AMBULATORY_CARE_PROVIDER_SITE_OTHER): Payer: Medicaid Other | Admitting: Women's Health

## 2013-06-15 ENCOUNTER — Other Ambulatory Visit (INDEPENDENT_AMBULATORY_CARE_PROVIDER_SITE_OTHER): Payer: Medicaid Other

## 2013-06-15 VITALS — BP 92/40 | Wt 140.5 lb

## 2013-06-15 DIAGNOSIS — O0990 Supervision of high risk pregnancy, unspecified, unspecified trimester: Secondary | ICD-10-CM

## 2013-06-15 DIAGNOSIS — O26879 Cervical shortening, unspecified trimester: Secondary | ICD-10-CM

## 2013-06-15 DIAGNOSIS — Z331 Pregnant state, incidental: Secondary | ICD-10-CM

## 2013-06-15 DIAGNOSIS — O09899 Supervision of other high risk pregnancies, unspecified trimester: Secondary | ICD-10-CM

## 2013-06-15 DIAGNOSIS — Z1389 Encounter for screening for other disorder: Secondary | ICD-10-CM

## 2013-06-15 LAB — POCT URINALYSIS DIPSTICK
Blood, UA: NEGATIVE
Glucose, UA: NEGATIVE
Ketones, UA: NEGATIVE
Leukocytes, UA: NEGATIVE
Nitrite, UA: NEGATIVE
Protein, UA: NEGATIVE

## 2013-06-15 MED ORDER — HYDROCODONE-ACETAMINOPHEN 5-325 MG PO TABS: 1.0000 | ORAL_TABLET | Freq: Every evening | ORAL | Status: DC | PRN

## 2013-06-15 NOTE — Progress Notes (Signed)
High Risk Pregnancy Diagnosis(es): short cervix [id number] [redacted]w[redacted]d Estimated Date of Delivery: 08/19/13 Blood pressure 92/40, weight 140 lb 8 oz (63.73 kg).  Urinalysis: Negative HPI:  Doing ok, cramping has decreased. Having trouble sleeping, daily ha's- requests refill of hydrocodone, states she only takes 1 pill at night as needed.  Work will not work w/ her on light duty/using stool, so recommended that she begin her leave, will bring papers back to office to be filled out BP, weight, and urine results all reviewed and noted.  Reports good fm. Denies regular uc's, lof, vb, uti s/s. No complaints.  Fundal Height:  31 Fetal Heart rate:  147 Edema:  trace  Reviewed ptl s/s, fkc. All questions were answered. Assessment: [redacted]w[redacted]d short cervix, s/p BMZ x 2 Medication(s) Plans:  Continue prometrium, refilled hydrocodone only to use 1 q hs prn #15 Treatment Plan:  Continue current treatment Follow up in 1wk for high-risk OB appt and CL u/s  Will get f/u CL u/s today

## 2013-06-15 NOTE — Progress Notes (Signed)
U/S(30+5wks)-CX ck only- cx closed 2.7cm (measured vaginally), vtx fetus

## 2013-06-15 NOTE — Patient Instructions (Signed)
Preterm Labor Information Preterm labor is when labor starts at less than 37 weeks of pregnancy. The normal length of a pregnancy is 39 to 41 weeks. CAUSES Often, there is no identifiable underlying cause as to why a woman goes into preterm labor. One of the most common known causes of preterm labor is infection. Infections of the uterus, cervix, vagina, amniotic sac, bladder, kidney, or even the lungs (pneumonia) can cause labor to start. Other suspected causes of preterm labor include:   Urogenital infections, such as yeast infections and bacterial vaginosis.   Uterine abnormalities (uterine shape, uterine septum, fibroids, or bleeding from the placenta).   A cervix that has been operated on (it may fail to stay closed).   Malformations in the fetus.   Multiple gestations (twins, triplets, and so on).   Breakage of the amniotic sac.  RISK FACTORS  Having a previous history of preterm labor.   Having premature rupture of membranes (PROM).   Having a placenta that covers the opening of the cervix (placenta previa).   Having a placenta that separates from the uterus (placental abruption).   Having a cervix that is too weak to hold the fetus in the uterus (incompetent cervix).   Having too much fluid in the amniotic sac (polyhydramnios).   Taking illegal drugs or smoking while pregnant.   Not gaining enough weight while pregnant.   Being younger than 49 and older than 29 years old.   Having a low socioeconomic status.   Being African American. SYMPTOMS Signs and symptoms of preterm labor include:   Menstrual-like cramps, abdominal pain, or back pain.  Uterine contractions that are regular, as frequent as six in an hour, regardless of their intensity (may be mild or painful).  Contractions that start on the top of the uterus and spread down to the lower abdomen and back.   A sense of increased pelvic pressure.   A watery or bloody mucus discharge that  comes from the vagina.  TREATMENT Depending on the length of the pregnancy and other circumstances, your health care provider may suggest bed rest. If necessary, there are medicines that can be given to stop contractions and to mature the fetal lungs. If labor happens before 34 weeks of pregnancy, a prolonged hospital stay may be recommended. Treatment depends on the condition of both you and the fetus.  WHAT SHOULD YOU DO IF YOU THINK YOU ARE IN PRETERM LABOR? Call your health care provider right away. You will need to go to the hospital to get checked immediately. HOW CAN YOU PREVENT PRETERM LABOR IN FUTURE PREGNANCIES? You should:   Stop smoking if you smoke.  Maintain healthy weight gain and avoid chemicals and drugs that are not necessary.  Be watchful for any type of infection.  Inform your health care provider if you have a known history of preterm labor. Document Released: 03/16/2003 Document Revised: 08/26/2012 Document Reviewed: 01/27/2012 [facility] Patient Information 2014 [place], [place].

## 2013-06-16 ENCOUNTER — Encounter: Payer: Self-pay | Admitting: Women's Health

## 2013-06-17 ENCOUNTER — Other Ambulatory Visit: Payer: Medicaid Other

## 2013-06-17 ENCOUNTER — Encounter: Payer: Medicaid Other | Admitting: Advanced Practice Midwife

## 2013-06-22 ENCOUNTER — Other Ambulatory Visit: Payer: Self-pay | Admitting: Women's Health

## 2013-06-22 ENCOUNTER — Encounter: Payer: Medicaid Other | Admitting: Women's Health

## 2013-06-22 ENCOUNTER — Encounter: Payer: Self-pay | Admitting: Women's Health

## 2013-06-22 ENCOUNTER — Ambulatory Visit (INDEPENDENT_AMBULATORY_CARE_PROVIDER_SITE_OTHER): Payer: Medicaid Other

## 2013-06-22 ENCOUNTER — Ambulatory Visit (INDEPENDENT_AMBULATORY_CARE_PROVIDER_SITE_OTHER): Payer: Medicaid Other | Admitting: Women's Health

## 2013-06-22 VITALS — BP 110/56 | Wt 138.0 lb

## 2013-06-22 DIAGNOSIS — O26879 Cervical shortening, unspecified trimester: Secondary | ICD-10-CM

## 2013-06-22 DIAGNOSIS — O0990 Supervision of high risk pregnancy, unspecified, unspecified trimester: Secondary | ICD-10-CM

## 2013-06-22 DIAGNOSIS — O98519 Other viral diseases complicating pregnancy, unspecified trimester: Secondary | ICD-10-CM

## 2013-06-22 DIAGNOSIS — Z331 Pregnant state, incidental: Secondary | ICD-10-CM

## 2013-06-22 DIAGNOSIS — O09899 Supervision of other high risk pregnancies, unspecified trimester: Secondary | ICD-10-CM

## 2013-06-22 DIAGNOSIS — Z1389 Encounter for screening for other disorder: Secondary | ICD-10-CM

## 2013-06-22 DIAGNOSIS — B009 Herpesviral infection, unspecified: Secondary | ICD-10-CM

## 2013-06-22 LAB — POCT URINALYSIS DIPSTICK
Blood, UA: NEGATIVE
Glucose, UA: NEGATIVE
Ketones, UA: NEGATIVE
Leukocytes, UA: NEGATIVE
Nitrite, UA: NEGATIVE
Protein, UA: NEGATIVE

## 2013-06-22 MED ORDER — ACYCLOVIR 400 MG PO TABS: 400.0000 mg | ORAL_TABLET | Freq: Three times a day (TID) | ORAL | Status: DC

## 2013-06-22 MED ORDER — HYDROCORTISONE ACE-PRAMOXINE 1-1 % RE FOAM: 1.0000 | Freq: Two times a day (BID) | RECTAL | Status: DC

## 2013-06-22 MED ORDER — DOCUSATE SODIUM 100 MG PO CAPS: 100.0000 mg | ORAL_CAPSULE | Freq: Every day | ORAL | Status: DC

## 2013-06-22 NOTE — Progress Notes (Signed)
High Risk Pregnancy Diagnosis(es): short cervix [id number] [redacted]w[redacted]d Estimated Date of Delivery: 08/19/13 Blood pressure 110/56, weight 138 lb (62.596 kg).  Urinalysis: Negative HPI:  Having bright red bleeding & pain w/ hard pellet bm's x 3d [facility], had problems w/ oldest daughter's dad who called and started cussing her out- upset States her lawyer needs note saying she is out of work and whether she can attend 7/9 court date- told her i do not know yet about 7/9 court- will have to see how she's doing at that time  BP, weight, and urine results all reviewed and noted.  Reports good fm. Denies regular uc's, lof, vb, uti s/s.   Fundal Height:  31 Fetal Heart rate:  153 u/s Edema:  None Rectal: small non-thrombosed hemorrhoid, internal exam no hemorrhoids/fissure felt  Reviewed today's u/s, CL now 1.4cm w/ small funneling down from 2.7cm- possibly d/t constipation/bearing down, reviewed constipation relief measures, rx'd colace and proctofoam. All questions were answered. Assessment: [redacted]w[redacted]d short cx on prometrium, constipation, hemorrhoid w/ rectal bleeding Medication(s) Plans:  Continue prometrium, start acyclovir for hsv2 suppression, begin colace & proctofoam Treatment Plan:  Continue visits, no more CL as almost 32wks Follow up in 1wk for high-risk OB appt w/ MD

## 2013-06-22 NOTE — Progress Notes (Signed)
U/S(31+5wks)-vtx active fetus, approp growth EFW 4 lb 5 oz (57th%tile), fluid WNL, FHR-153 bpm, anterior Gr 1 placenta, female fetus, Cervix-1.4cm with small amount of funneling noted cervix decreased to 1.1cm with fundal pressure applied (measured vaginally)

## 2013-06-22 NOTE — Patient Instructions (Addendum)
Constipation  Drink plenty of fluid, preferably water, throughout the day  Eat foods high in fiber such as fruits, vegetables, and grains  Drink warm fluids, especially warm prune juice, or decaf coffee  Eat a 1/2 cup of real oatmeal (not instant), 1/2 cup applesauce, and 1/2-1 cup warm prune juice every day  Take Colace (docusate sodium) stool softener once or twice a day to help keep the stool soft.  You may take Miralax once daily as needed to help keep your bowels regular.  If you are pregnant, wait until you are out of your first trimester (12-14 weeks of pregnancy)  Preterm Labor Information Preterm labor is when labor starts at less than 37 weeks of pregnancy. The normal length of a pregnancy is 39 to 41 weeks. CAUSES Often, there is no identifiable underlying cause as to why a woman goes into preterm labor. One of the most common known causes of preterm labor is infection. Infections of the uterus, cervix, vagina, amniotic sac, bladder, kidney, or even the lungs (pneumonia) can cause labor to start. Other suspected causes of preterm labor include:   Urogenital infections, such as yeast infections and bacterial vaginosis.   Uterine abnormalities (uterine shape, uterine septum, fibroids, or bleeding from the placenta).   A cervix that has been operated on (it may fail to stay closed).   Malformations in the fetus.   Multiple gestations (twins, triplets, and so on).   Breakage of the amniotic sac.  RISK FACTORS  Having a previous history of preterm labor.   Having premature rupture of membranes (PROM).   Having a placenta that covers the opening of the cervix (placenta previa).   Having a placenta that separates from the uterus (placental abruption).   Having a cervix that is too weak to hold the fetus in the uterus (incompetent cervix).   Having too much fluid in the amniotic sac (polyhydramnios).   Taking illegal drugs or smoking while pregnant.   Not  gaining enough weight while pregnant.   Being younger than 38 and older than 29 years old.   Having a low socioeconomic status.   Being African American. SYMPTOMS Signs and symptoms of preterm labor include:   Menstrual-like cramps, abdominal pain, or back pain.  Uterine contractions that are regular, as frequent as six in an hour, regardless of their intensity (may be mild or painful).  Contractions that start on the top of the uterus and spread down to the lower abdomen and back.   A sense of increased pelvic pressure.   A watery or bloody mucus discharge that comes from the vagina.  TREATMENT Depending on the length of the pregnancy and other circumstances, your health care provider may suggest bed rest. If necessary, there are medicines that can be given to stop contractions and to mature the fetal lungs. If labor happens before 34 weeks of pregnancy, a prolonged hospital stay may be recommended. Treatment depends on the condition of both you and the fetus.  WHAT SHOULD YOU DO IF YOU THINK YOU ARE IN PRETERM LABOR? Call your health care provider right away. You will need to go to the hospital to get checked immediately. HOW CAN YOU PREVENT PRETERM LABOR IN FUTURE PREGNANCIES? You should:   Stop smoking if you smoke.  Maintain healthy weight gain and avoid chemicals and drugs that are not necessary.  Be watchful for any type of infection.  Inform your health care provider if you have a known history of preterm labor. Document Released:  03/16/2003 Document Revised: 08/26/2012 Document Reviewed: 01/27/2012 [facility] Patient Information 2014 [place].

## 2013-06-24 ENCOUNTER — Telehealth: Payer: Self-pay | Admitting: Obstetrics and Gynecology

## 2013-06-24 DIAGNOSIS — Z029 Encounter for administrative examinations, unspecified: Secondary | ICD-10-CM

## 2013-06-24 NOTE — Telephone Encounter (Signed)
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 States she is not having an out break at this time and feels she is on to many medications. Explained to the pt that the benefit of taking acyclovir was to prevent an outbreak at time of delivery. Pt states was not going to take the medication until a Estefanie Cornforth gave her more information on risk and benefits. Call transferred to Mylo Red, CNM.

## 2013-06-29 ENCOUNTER — Encounter: Payer: Self-pay | Admitting: Obstetrics and Gynecology

## 2013-06-29 ENCOUNTER — Ambulatory Visit (INDEPENDENT_AMBULATORY_CARE_PROVIDER_SITE_OTHER): Payer: Medicaid Other | Admitting: Obstetrics and Gynecology

## 2013-06-29 VITALS — BP 118/78 | Wt 139.0 lb

## 2013-06-29 DIAGNOSIS — O09899 Supervision of other high risk pregnancies, unspecified trimester: Secondary | ICD-10-CM

## 2013-06-29 DIAGNOSIS — Z1389 Encounter for screening for other disorder: Secondary | ICD-10-CM

## 2013-06-29 DIAGNOSIS — Z331 Pregnant state, incidental: Secondary | ICD-10-CM

## 2013-06-29 LAB — POCT URINALYSIS DIPSTICK
Blood, UA: NEGATIVE
Glucose, UA: NEGATIVE
Ketones, UA: NEGATIVE
Leukocytes, UA: NEGATIVE
Nitrite, UA: NEGATIVE
Protein, UA: NEGATIVE

## 2013-06-29 NOTE — Progress Notes (Signed)
Pt states that she has had a change in her discharge, it is not as thick as it was, it is more watery since this morning. No odor.

## 2013-06-29 NOTE — Progress Notes (Signed)
Had sense of "POP" last night, and is concerned over increased discharge x 24 hours. Fern test negative. No suspicion of Rom.

## 2013-07-13 ENCOUNTER — Encounter: Payer: Self-pay | Admitting: Advanced Practice Midwife

## 2013-07-13 ENCOUNTER — Ambulatory Visit (INDEPENDENT_AMBULATORY_CARE_PROVIDER_SITE_OTHER): Payer: Medicaid Other | Admitting: Advanced Practice Midwife

## 2013-07-13 VITALS — BP 116/54 | Wt 142.0 lb

## 2013-07-13 DIAGNOSIS — Z1389 Encounter for screening for other disorder: Secondary | ICD-10-CM

## 2013-07-13 DIAGNOSIS — O09899 Supervision of other high risk pregnancies, unspecified trimester: Secondary | ICD-10-CM

## 2013-07-13 DIAGNOSIS — O26879 Cervical shortening, unspecified trimester: Secondary | ICD-10-CM

## 2013-07-13 DIAGNOSIS — O26873 Cervical shortening, third trimester: Secondary | ICD-10-CM

## 2013-07-13 DIAGNOSIS — Z331 Pregnant state, incidental: Secondary | ICD-10-CM

## 2013-07-13 DIAGNOSIS — O2390 Unspecified genitourinary tract infection in pregnancy, unspecified trimester: Secondary | ICD-10-CM

## 2013-07-13 LAB — POCT URINALYSIS DIPSTICK
Blood, UA: NEGATIVE
Glucose, UA: NEGATIVE
Ketones, UA: NEGATIVE
Leukocytes, UA: NEGATIVE
Nitrite, UA: NEGATIVE
Protein, UA: NEGATIVE

## 2013-07-13 NOTE — Progress Notes (Signed)
High Risk Pregnancy Diagnosis(es):   Short cervix on prometrium  [id number] [redacted]w[redacted]d Estimated Date of Delivery: 08/19/13  Blood pressure 116/54, weight 142 lb (64.411 kg).  Urinalysis: Negative   HPI: Feels like vagina is "poofy" with occ itch.  No contractions     BP weight and urine results all reviewed and noted. Patient reports good fetal movement, denies any bleeding and no rupture of membranes symptoms. Vulvar erythema, no edema  SSE: Evidence of suppositories.  Wet prep:  No bacteria, WBC, clue, yeast, or trich  Fundal Height:  34 Fetal Heart rate:  149 Edema:  no   All questions were answered.  Assessment:  [redacted]w[redacted]d,   Short cervix, stable  Medication(s) Plans:  Continue prometrium qhs until 36-37 weeks  Treatment Plan:  Desitin externally for vulvar irritation, presumably from prometrium  Follow up in 1 week for OB appt,

## 2013-07-20 ENCOUNTER — Ambulatory Visit (INDEPENDENT_AMBULATORY_CARE_PROVIDER_SITE_OTHER): Payer: Medicaid Other | Admitting: Advanced Practice Midwife

## 2013-07-20 ENCOUNTER — Encounter: Payer: Medicaid Other | Admitting: Advanced Practice Midwife

## 2013-07-20 ENCOUNTER — Encounter: Payer: Self-pay | Admitting: Advanced Practice Midwife

## 2013-07-20 VITALS — BP 118/70 | Wt 142.0 lb

## 2013-07-20 DIAGNOSIS — O0993 Supervision of high risk pregnancy, unspecified, third trimester: Secondary | ICD-10-CM

## 2013-07-20 DIAGNOSIS — Z1389 Encounter for screening for other disorder: Secondary | ICD-10-CM

## 2013-07-20 DIAGNOSIS — Z331 Pregnant state, incidental: Secondary | ICD-10-CM

## 2013-07-20 DIAGNOSIS — O09899 Supervision of other high risk pregnancies, unspecified trimester: Secondary | ICD-10-CM

## 2013-07-20 DIAGNOSIS — O26879 Cervical shortening, unspecified trimester: Secondary | ICD-10-CM

## 2013-07-20 LAB — POCT URINALYSIS DIPSTICK
Blood, UA: NEGATIVE
Glucose, UA: NEGATIVE
Ketones, UA: NEGATIVE
Leukocytes, UA: NEGATIVE
Nitrite, UA: NEGATIVE
Protein, UA: NEGATIVE

## 2013-07-20 NOTE — Progress Notes (Signed)
High Risk Pregnancy Diagnosis(es):   Short cervix  [id number] [redacted]w[redacted]d Estimated Date of Delivery: 08/19/13  Blood pressure 118/70, weight 142 lb (64.411 kg).  Urinalysis: Negative   HPI: Feels pressure, ligament pain.  Plans to get Tdap. Plans to stop prometrium     BP weight and urine results all reviewed and noted. Patient reports good fetal movement, denies any bleeding and no rupture of membranes symptoms or regular contractions.  Fundal Height:  34 Fetal Heart rate:  158 Edema:  no  All questions were answered.  Assessment:  [redacted]w[redacted]d,   shortt cervix  Medication(s) Plans:  May d/c prometrium at 36 weeks  Treatment Plan:  Weekly visits  Follow up in 1 weeks for OB appt, GBS culture

## 2013-07-20 NOTE — Progress Notes (Signed)
Pt states that she is having a lot of pain in her lower abdomin.

## 2013-07-27 ENCOUNTER — Encounter: Payer: Self-pay | Admitting: Women's Health

## 2013-07-27 ENCOUNTER — Ambulatory Visit (INDEPENDENT_AMBULATORY_CARE_PROVIDER_SITE_OTHER): Payer: Medicaid Other | Admitting: Women's Health

## 2013-07-27 VITALS — BP 104/66 | Wt 143.0 lb

## 2013-07-27 DIAGNOSIS — Z331 Pregnant state, incidental: Secondary | ICD-10-CM

## 2013-07-27 DIAGNOSIS — N898 Other specified noninflammatory disorders of vagina: Secondary | ICD-10-CM

## 2013-07-27 DIAGNOSIS — Z1389 Encounter for screening for other disorder: Secondary | ICD-10-CM

## 2013-07-27 DIAGNOSIS — O26879 Cervical shortening, unspecified trimester: Secondary | ICD-10-CM

## 2013-07-27 DIAGNOSIS — O9989 Other specified diseases and conditions complicating pregnancy, childbirth and the puerperium: Secondary | ICD-10-CM

## 2013-07-27 DIAGNOSIS — O0993 Supervision of high risk pregnancy, unspecified, third trimester: Secondary | ICD-10-CM

## 2013-07-27 DIAGNOSIS — O2390 Unspecified genitourinary tract infection in pregnancy, unspecified trimester: Secondary | ICD-10-CM

## 2013-07-27 DIAGNOSIS — O09899 Supervision of other high risk pregnancies, unspecified trimester: Secondary | ICD-10-CM

## 2013-07-27 DIAGNOSIS — Z3483 Encounter for supervision of other normal pregnancy, third trimester: Secondary | ICD-10-CM

## 2013-07-27 DIAGNOSIS — O26893 Other specified pregnancy related conditions, third trimester: Secondary | ICD-10-CM

## 2013-07-27 LAB — POCT URINALYSIS DIPSTICK
Blood, UA: NEGATIVE
Glucose, UA: NEGATIVE
Ketones, UA: NEGATIVE
Nitrite, UA: NEGATIVE
Protein, UA: NEGATIVE

## 2013-07-27 LAB — POCT WET PREP (WET MOUNT): Clue Cells Wet Prep Whiff POC: NEGATIVE

## 2013-07-27 NOTE — Patient Instructions (Addendum)
Dr. Vic Blackbird: [phone] (Family Doctor)  *Circumcision: [phone] at hospital, $244 at [facility], has to be paid up front before it is done. If you want the circumcision done at [facility] you can make payments during pregnancy. If you are interested in this, see receptionist at [place].  If your baby is older than 28 days when you have the circumcision done at [facility], the fee will go up to $325.50.    Call the office [phone]) or go to [facility] if:  You begin to have strong, frequent contractions  Your water breaks.  Sometimes it is a big gush of fluid, sometimes it is just a trickle that keeps getting your panties wet or running down your legs  You have vaginal bleeding.  It is normal to have a small amount of spotting if your cervix was checked.   You don't feel your baby moving like normal.  If you don't, get you something to eat and drink and lay down and focus on feeling your baby move.  You should feel at least 10 movements in 2 hours.  If you don't, you should call the office or go to [place] Preterm labor is when labor starts at less than 37 weeks of pregnancy. The normal length of a pregnancy is 39 to 41 weeks. CAUSES Often, there is no identifiable underlying cause as to why a woman goes into preterm labor. One of the most common known causes of preterm labor is infection. Infections of the uterus, cervix, vagina, amniotic sac, bladder, kidney, or even the lungs (pneumonia) can cause labor to start. Other suspected causes of preterm labor include:   Urogenital infections, such as yeast infections and bacterial vaginosis.   Uterine abnormalities (uterine shape, uterine septum, fibroids, or bleeding from the placenta).   A cervix that has been operated on (it may fail to stay closed).   Malformations in the fetus.   Multiple gestations (twins, triplets, and so on).   Breakage of the amniotic sac.  RISK  FACTORS  Having a previous history of preterm labor.   Having premature rupture of membranes (PROM).   Having a placenta that covers the opening of the cervix (placenta previa).   Having a placenta that separates from the uterus (placental abruption).   Having a cervix that is too weak to hold the fetus in the uterus (incompetent cervix).   Having too much fluid in the amniotic sac (polyhydramnios).   Taking illegal drugs or smoking while pregnant.   Not gaining enough weight while pregnant.   Being younger than 29 and older than 29 years old.   Having a low socioeconomic status.   Being African American. SYMPTOMS Signs and symptoms of preterm labor include:   Menstrual-like cramps, abdominal pain, or back pain.  Uterine contractions that are regular, as frequent as six in an hour, regardless of their intensity (may be mild or painful).  Contractions that start on the top of the uterus and spread down to the lower abdomen and back.   A sense of increased pelvic pressure.   A watery or bloody mucus discharge that comes from the vagina.  TREATMENT Depending on the length of the pregnancy and other circumstances, your health care provider may suggest bed rest. If necessary, there are medicines that can be given to stop contractions and to mature the fetal lungs. If labor happens before 34 weeks of pregnancy, a prolonged hospital stay may be recommended. Treatment depends  on the condition of both you and the fetus.  WHAT SHOULD YOU DO IF YOU THINK YOU ARE IN PRETERM LABOR? Call your health care provider right away. You will need to go to the hospital to get checked immediately. HOW CAN YOU PREVENT PRETERM LABOR IN FUTURE PREGNANCIES? You should:   Stop smoking if you smoke.  Maintain healthy weight gain and avoid chemicals and drugs that are not necessary.  Be watchful for any type of infection.  Inform your health care provider if you have a known history of  preterm labor. Document Released: 03/16/2003 Document Revised: 08/26/2012 Document Reviewed: 01/27/2012 [facility] Patient Information 2015 [place], [place]. This information is not intended to replace advice given to you by your health care provider. Make sure you discuss any questions you have with your health care provider.

## 2013-07-27 NOTE — Progress Notes (Signed)
High Risk Pregnancy Diagnosis(es): short cervix [id number] [redacted]w[redacted]d Estimated Date of Delivery: 08/19/13 BP 104/66  Wt 143 lb (64.864 kg)  Urinalysis: Negative HPI:  Leaking watery fluid x 3 days, vulvovaginal burning/itching. Saw spots in shower today. Still having some times when heart flutters. Not taking celexa daily.  Just moved in to her own place in [place]! Getting everything ready for baby.  BP, weight, and urine reviewed.  Reports good fm. Denies regular uc's, vb, uti s/s.   Fundal Height:  36 Fetal Heart rate:  140 Edema:  Trace Spec exam: thick clumpy white d/c and watery d/c as well, no pooling of fluid. Nitrazine neg, fern neg.  Wet prep: mod yeast GBS collected SVE per request: 3/80/-2, vtx  Reviewed ptl s/s, fkc.  All questions were answered Assessment: [redacted]w[redacted]d short cx Medication(s) Plans:  Stopped prometrium ~ 2wks ago, continue acyclovir, gave monistat 7 sample Treatment Plan:  Weekly visits, no longer high risk after 37wks Follow up in 1wk for high-risk OB appt

## 2013-07-28 ENCOUNTER — Encounter: Payer: Self-pay | Admitting: Women's Health

## 2013-07-28 ENCOUNTER — Encounter: Payer: Medicaid Other | Admitting: Women's Health

## 2013-07-28 LAB — GC/CHLAMYDIA PROBE AMP
CT Probe RNA: NEGATIVE
GC Probe RNA: NEGATIVE

## 2013-07-29 LAB — STREP B DNA PROBE: GBSP: DETECTED

## 2013-08-02 ENCOUNTER — Encounter: Payer: Self-pay | Admitting: Women's Health

## 2013-08-02 ENCOUNTER — Telehealth: Payer: Self-pay | Admitting: *Deleted

## 2013-08-02 NOTE — Telephone Encounter (Signed)
Pt states she is having diarrhea and stomach cramps, she has an appointment tomorrow for routine ob visit, I advised her she can take Imodium AD and push fliuds and keep apt for tomorrow.  Pt verbalized understanding.

## 2013-08-03 ENCOUNTER — Ambulatory Visit (INDEPENDENT_AMBULATORY_CARE_PROVIDER_SITE_OTHER): Payer: Medicaid Other | Admitting: Women's Health

## 2013-08-03 ENCOUNTER — Encounter: Payer: Self-pay | Admitting: Women's Health

## 2013-08-03 VITALS — BP 112/60 | Wt 143.0 lb

## 2013-08-03 DIAGNOSIS — Z3481 Encounter for supervision of other normal pregnancy, first trimester: Secondary | ICD-10-CM

## 2013-08-03 DIAGNOSIS — Z3480 Encounter for supervision of other normal pregnancy, unspecified trimester: Secondary | ICD-10-CM

## 2013-08-03 DIAGNOSIS — Z331 Pregnant state, incidental: Secondary | ICD-10-CM

## 2013-08-03 DIAGNOSIS — Z1389 Encounter for screening for other disorder: Secondary | ICD-10-CM

## 2013-08-03 LAB — POCT URINALYSIS DIPSTICK
Glucose, UA: NEGATIVE
Ketones, UA: NEGATIVE
Nitrite, UA: NEGATIVE
Protein, UA: NEGATIVE

## 2013-08-03 NOTE — Patient Instructions (Signed)
Call the office ([phone]) or go to [facility] if:  You begin to have strong, frequent contractions  Your water breaks.  Sometimes it is a big gush of fluid, sometimes it is just a trickle that keeps getting your panties wet or running down your legs  You have vaginal bleeding.  It is normal to have a small amount of spotting if your cervix was checked.   You don't feel your baby moving like normal.  If you don't, get you something to eat and drink and lay down and focus on feeling your baby move.  You should feel at least 10 movements in 2 hours.  If you don't, you should call the office or go to [facility].    Braxton Hicks Contractions Contractions of the uterus can occur throughout pregnancy. Contractions are not always a sign that you are in labor.  WHAT ARE BRAXTON HICKS CONTRACTIONS?  Contractions that occur before labor are called Braxton Hicks contractions, or false labor. Toward the end of pregnancy (32-34 weeks), these contractions can develop more often and may become more forceful. This is not true labor because these contractions do not result in opening (dilatation) and thinning of the cervix. They are sometimes difficult to tell apart from true labor because these contractions can be forceful and people have different pain tolerances. You should not feel embarrassed if you go to the hospital with false labor. Sometimes, the only way to tell if you are in true labor is for your health care provider to look for changes in the cervix. If there are no prenatal problems or other health problems associated with the pregnancy, it is completely safe to be sent home with false labor and await the onset of true labor. HOW CAN YOU TELL THE DIFFERENCE BETWEEN TRUE AND FALSE LABOR? False Labor  The contractions of false labor are usually shorter and not as hard as those of true labor.   The contractions are usually irregular.   The contractions are often felt in the front of  the lower abdomen and in the groin.   The contractions may go away when you walk around or change positions while lying down.   The contractions get weaker and are shorter lasting as time goes on.   The contractions do not usually become progressively stronger, regular, and closer together as with true labor.  True Labor  Contractions in true labor last 30-70 seconds, become very regular, usually become more intense, and increase in frequency.   The contractions do not go away with walking.   The discomfort is usually felt in the top of the uterus and spreads to the lower abdomen and low back.   True labor can be determined by your health care provider with an exam. This will show that the cervix is dilating and getting thinner.  WHAT TO REMEMBER  Keep up with your usual exercises and follow other instructions given by your health care provider.   Take medicines as directed by your health care provider.   Keep your regular prenatal appointments.   Eat and drink lightly if you think you are going into labor.   If Braxton Hicks contractions are making you uncomfortable:   Change your position from lying down or resting to walking, or from walking to resting.   Sit and rest in a tub of warm water.   Drink 2-3 glasses of water. Dehydration may cause these contractions.   Do slow and deep breathing several times an hour.    WHEN SHOULD I SEEK IMMEDIATE MEDICAL CARE? Seek immediate medical care if:  Your contractions become stronger, more regular, and closer together.   You have fluid leaking or gushing from your vagina.   You have a fever.   You Marter blood-tinged mucus.   You have vaginal bleeding.   You have continuous abdominal pain.   You have low back pain that you never had before.   You feel your baby's head pushing down and causing pelvic pressure.   Your baby is not moving as much as it used to.  Document Released: 12/24/2004 Document  Revised: 12/29/2012 Document Reviewed: 10/05/2012 [facility] Patient Information 2015 [place], [place]. This information is not intended to replace advice given to you by your health care provider. Make sure you discuss any questions you have with your health care provider.  Group B Streptococcus Infection During Pregnancy Group B streptococcus (GBS) is a type of bacteria often found in healthy women. GBS is not the same as the bacteria that causes strep throat. You may have GBS in your vagina, rectum, or bladder. GBS does not spread through sexual contact, but it can be passed to a baby during childbirth. This can be dangerous for your baby. It is not dangerous to you and usually does not cause any symptoms. Your health care provider may test you for GBS when your pregnancy is between 35 and 37 weeks. GBS is dangerous only during birth, so there is no need to test for it earlier. It is possible to have GBS during pregnancy and never Stacey it to your baby. If your test results are positive for GBS, your health care provider may recommend giving you antibiotic medicine during delivery to make sure your baby stays healthy. RISK FACTORS You are more likely to Scarlata GBS to your baby if:   Your water breaks (ruptured membrane) or you go into labor before 37 weeks.  Your water breaks 18 hours before you deliver.  You passed GBS during a previous pregnancy.  You have a urinary tract infection caused by GBS any time during pregnancy.  You have a fever during labor. SYMPTOMS Most women who have GBS do not have any symptoms. If you have a urinary tract infection caused by GBS, you might have frequent or painful urination and fever. Babies who get GBS usually show symptoms within 7 days of birth. Symptoms may include:   Breathing problems.  Heart and blood pressure problems.  Digestive and kidney problems. DIAGNOSIS Routine screening for GBS is recommended for all pregnant women. A health care provider  takes a sample of the fluid in your vagina and rectum with a swab. It is then sent to a lab to be checked for GBS. A sample of your urine may also be checked for the bacteria.  TREATMENT If you test positive for GBS, you may need treatment with an antibiotic medicine during labor. As soon as you go into labor, or as soon as your membranes rupture, you will get the antibiotic medicine through an IV access. You will continue to get the medicine until after you give birth. You do not need antibiotic medicine if you are having a cesarean delivery.If your baby shows signs or symptoms of GBS after birth, your baby can also be treated with an antibiotic medicine. HOME CARE INSTRUCTIONS   Take all antibiotic medicine as prescribed by your health care provider. Only take medicine as directed.   Continue with prenatal visits and care.   Keep all follow-up appointments.  SEEK MEDICAL CARE IF:   You have pain when you urinate.   You have to urinate frequently.   You have a fever.  SEEK IMMEDIATE MEDICAL CARE IF:   Your membranes rupture.  You go into labor. Document Released: 04/02/2007 Document Revised: 12/29/2012 Document Reviewed: 10/16/2012 [facility] Patient Information 2015 [place], [place]. This information is not intended to replace advice given to you by your health care provider. Make sure you discuss any questions you have with your health care provider.

## 2013-08-03 NOTE — Progress Notes (Signed)
Low-risk OB appointment [id number] [redacted]w[redacted]d Estimated Date of Delivery: 08/19/13 BP 112/60  Wt 143 lb (64.864 kg)  BP, weight, and urine reviewed.  Refer to obstetrical flow sheet for FH & FHR.  Reports good fm.  Denies uti s/s. +Irregular uc's, were getting regular the other day, almost went to [facility]- but they stopped w/ shower. Some leakage of pink-tinged fluid upon arrival to office today while urinating. Still using monistat for yeast infection. No recent sex.  Spec exam: large amount thick white creamy d/c?monistat, w/ pinkish tinge. cx visually open 1.5cm, no active bleeding, no pooling of fluid, nitrazine neg, fern neg SVE: 3/70/-2, vtx, soft Reviewed labor s/s, fkc. Plan:  Continue routine obstetrical care  F/U in 1wk for OB appointment

## 2013-08-04 ENCOUNTER — Telehealth: Payer: Self-pay | Admitting: Advanced Practice Midwife

## 2013-08-04 ENCOUNTER — Telehealth: Payer: Self-pay | Admitting: *Deleted

## 2013-08-04 NOTE — Telephone Encounter (Signed)
Pt states she had greenish mucus after going to the bathroom this morning and having some abd cramping.  Pt reports good FM, no bleeding and no gush of fluids, advised pt to push fluids, take Tylenol as needed and rest on rt side and monitor pain/contractions.  Informed if has a gush of fluids, vaginal bleeding or contractions that are 7 - 12 minutes apart to go to [facility] for evaluation.  Pt verbalized understanding

## 2013-08-10 ENCOUNTER — Ambulatory Visit (INDEPENDENT_AMBULATORY_CARE_PROVIDER_SITE_OTHER): Payer: Medicaid Other | Admitting: Advanced Practice Midwife

## 2013-08-10 VITALS — BP 112/52 | Wt 146.0 lb

## 2013-08-10 DIAGNOSIS — Z3480 Encounter for supervision of other normal pregnancy, unspecified trimester: Secondary | ICD-10-CM

## 2013-08-10 DIAGNOSIS — Z1389 Encounter for screening for other disorder: Secondary | ICD-10-CM

## 2013-08-10 DIAGNOSIS — Z3483 Encounter for supervision of other normal pregnancy, third trimester: Secondary | ICD-10-CM

## 2013-08-10 DIAGNOSIS — Z331 Pregnant state, incidental: Secondary | ICD-10-CM

## 2013-08-10 LAB — POCT URINALYSIS DIPSTICK
Blood, UA: NEGATIVE
Glucose, UA: NEGATIVE
Ketones, UA: NEGATIVE
Leukocytes, UA: NEGATIVE
Nitrite, UA: NEGATIVE
Protein, UA: NEGATIVE

## 2013-08-10 NOTE — Progress Notes (Signed)
[id number] [redacted]w[redacted]d Estimated Date of Delivery: 08/19/13  Blood pressure 112/52, weight 146 lb (66.225 kg).   BP weight and urine results all reviewed and noted.  Please refer to the obstetrical flow sheet for the fundal height and fetal heart rate documentation:  Patient reports good fetal movement, denies any bleeding and no rupture of membranes symptoms or regular contractions. Patient is without complaints. All questions were answered.  Plan:  Continued routine obstetrical care,   Follow up in 1 weeks for OB appointment,

## 2013-08-16 ENCOUNTER — Encounter (HOSPITAL_COMMUNITY): Payer: Self-pay | Admitting: *Deleted

## 2013-08-16 ENCOUNTER — Ambulatory Visit (INDEPENDENT_AMBULATORY_CARE_PROVIDER_SITE_OTHER): Payer: Medicaid Other | Admitting: Women's Health

## 2013-08-16 ENCOUNTER — Encounter: Payer: Self-pay | Admitting: Women's Health

## 2013-08-16 ENCOUNTER — Encounter (HOSPITAL_COMMUNITY): Payer: Medicaid Other | Admitting: Anesthesiology

## 2013-08-16 ENCOUNTER — Inpatient Hospital Stay (HOSPITAL_COMMUNITY): Payer: Medicaid Other | Admitting: Anesthesiology

## 2013-08-16 ENCOUNTER — Inpatient Hospital Stay (HOSPITAL_COMMUNITY)
Admission: AD | Admit: 2013-08-16 | Discharge: 2013-08-19 | DRG: 774 | Disposition: A | Discharge status: Home / Self Care | Payer: Medicaid Other | Source: Ambulatory Visit | Attending: Obstetrics & Gynecology | Admitting: Obstetrics & Gynecology

## 2013-08-16 ENCOUNTER — Encounter: Payer: Medicaid Other | Admitting: Women's Health

## 2013-08-16 VITALS — BP 106/60 | Wt 147.0 lb

## 2013-08-16 DIAGNOSIS — M4120 Other idiopathic scoliosis, site unspecified: Secondary | ICD-10-CM | POA: Diagnosis present

## 2013-08-16 DIAGNOSIS — D689 Coagulation defect, unspecified: Secondary | ICD-10-CM | POA: Diagnosis present

## 2013-08-16 DIAGNOSIS — O98519 Other viral diseases complicating pregnancy, unspecified trimester: Secondary | ICD-10-CM | POA: Diagnosis present

## 2013-08-16 DIAGNOSIS — F209 Schizophrenia, unspecified: Secondary | ICD-10-CM | POA: Diagnosis present

## 2013-08-16 DIAGNOSIS — Z3483 Encounter for supervision of other normal pregnancy, third trimester: Secondary | ICD-10-CM

## 2013-08-16 DIAGNOSIS — O26879 Cervical shortening, unspecified trimester: Principal | ICD-10-CM | POA: Diagnosis present

## 2013-08-16 DIAGNOSIS — F319 Bipolar disorder, unspecified: Secondary | ICD-10-CM | POA: Diagnosis present

## 2013-08-16 DIAGNOSIS — O99892 Other specified diseases and conditions complicating childbirth: Secondary | ICD-10-CM | POA: Diagnosis present

## 2013-08-16 DIAGNOSIS — Z87891 Personal history of nicotine dependence: Secondary | ICD-10-CM

## 2013-08-16 DIAGNOSIS — Z3480 Encounter for supervision of other normal pregnancy, unspecified trimester: Secondary | ICD-10-CM

## 2013-08-16 DIAGNOSIS — Z91040 Latex allergy status: Secondary | ICD-10-CM

## 2013-08-16 DIAGNOSIS — O9902 Anemia complicating childbirth: Secondary | ICD-10-CM | POA: Diagnosis present

## 2013-08-16 DIAGNOSIS — O99344 Other mental disorders complicating childbirth: Secondary | ICD-10-CM | POA: Diagnosis present

## 2013-08-16 DIAGNOSIS — O479 False labor, unspecified: Secondary | ICD-10-CM | POA: Diagnosis present

## 2013-08-16 DIAGNOSIS — A6000 Herpesviral infection of urogenital system, unspecified: Secondary | ICD-10-CM | POA: Diagnosis present

## 2013-08-16 DIAGNOSIS — F32A Depression, unspecified: Secondary | ICD-10-CM

## 2013-08-16 DIAGNOSIS — F329 Major depressive disorder, single episode, unspecified: Secondary | ICD-10-CM

## 2013-08-16 DIAGNOSIS — Z22330 Carrier of Group B streptococcus: Secondary | ICD-10-CM

## 2013-08-16 DIAGNOSIS — Z331 Pregnant state, incidental: Secondary | ICD-10-CM

## 2013-08-16 DIAGNOSIS — D573 Sickle-cell trait: Secondary | ICD-10-CM | POA: Diagnosis present

## 2013-08-16 DIAGNOSIS — IMO0001 Reserved for inherently not codable concepts without codable children: Secondary | ICD-10-CM

## 2013-08-16 DIAGNOSIS — R768 Other specified abnormal immunological findings in serum: Secondary | ICD-10-CM

## 2013-08-16 DIAGNOSIS — Z1389 Encounter for screening for other disorder: Secondary | ICD-10-CM

## 2013-08-16 DIAGNOSIS — O9912 Other diseases of the blood and blood-forming organs and certain disorders involving the immune mechanism complicating childbirth: Secondary | ICD-10-CM

## 2013-08-16 DIAGNOSIS — O9989 Other specified diseases and conditions complicating pregnancy, childbirth and the puerperium: Secondary | ICD-10-CM

## 2013-08-16 LAB — CBC
HCT: 35.3 % — ABNORMAL LOW (ref 36.0–46.0)
Hemoglobin: 12.1 g/dL (ref 12.0–15.0)
MCH: 26.9 pg (ref 26.0–34.0)
MCHC: 34.3 g/dL (ref 30.0–36.0)
MCV: 78.6 fL (ref 78.0–100.0)
Platelets: 230 10*3/uL (ref 150–400)
RBC: 4.49 MIL/uL (ref 3.87–5.11)
RDW: 14.8 % (ref 11.5–15.5)
WBC: 12.2 10*3/uL — ABNORMAL HIGH (ref 4.0–10.5)

## 2013-08-16 LAB — POCT URINALYSIS DIPSTICK
Glucose, UA: NEGATIVE
Ketones, UA: NEGATIVE
Nitrite, UA: NEGATIVE
Protein, UA: NEGATIVE

## 2013-08-16 MED ORDER — LIDOCAINE HCL (PF) 1 % IJ SOLN
INTRAMUSCULAR | Status: DC | PRN
  Administered 2013-08-16 (×4): 4 mL

## 2013-08-16 MED ORDER — PHENYLEPHRINE 40 MCG/ML (10ML) SYRINGE FOR IV PUSH (FOR BLOOD PRESSURE SUPPORT)
80.0000 ug | PREFILLED_SYRINGE | INTRAVENOUS | Status: DC | PRN
  Administered 2013-08-16: 80 ug via INTRAVENOUS
  Filled 2013-08-16: qty 2

## 2013-08-16 MED ORDER — ACETAMINOPHEN 325 MG PO TABS: 650.0000 mg | ORAL_TABLET | ORAL | Status: DC | PRN

## 2013-08-16 MED ORDER — PRENATAL MULTIVITAMIN CH: 1.0000 | ORAL_TABLET | Freq: Every day | ORAL | Status: DC

## 2013-08-16 MED ORDER — ACYCLOVIR 400 MG PO TABS
400.0000 mg | ORAL_TABLET | Freq: Three times a day (TID) | ORAL | Status: DC
  Filled 2013-08-16 (×2): qty 1

## 2013-08-16 MED ORDER — OXYTOCIN 40 UNITS IN LACTATED RINGERS INFUSION - SIMPLE MED
62.5000 mL/h | INTRAVENOUS | Status: DC
Start: 2013-08-16 — End: 2013-08-17
  Administered 2013-08-17: 62.5 mL/h via INTRAVENOUS

## 2013-08-16 MED ORDER — PHENYLEPHRINE 40 MCG/ML (10ML) SYRINGE FOR IV PUSH (FOR BLOOD PRESSURE SUPPORT)
80.0000 ug | PREFILLED_SYRINGE | INTRAVENOUS | Status: DC | PRN
Start: 2013-08-16 — End: 2013-08-17
  Administered 2013-08-16: 80 ug via INTRAVENOUS
  Filled 2013-08-16: qty 10
  Filled 2013-08-16: qty 2

## 2013-08-16 MED ORDER — LIDOCAINE HCL (PF) 1 % IJ SOLN
30.0000 mL | INTRAMUSCULAR | Status: DC | PRN
  Filled 2013-08-16: qty 30

## 2013-08-16 MED ORDER — OXYTOCIN BOLUS FROM INFUSION
500.0000 mL | INTRAVENOUS | Status: DC
  Administered 2013-08-17: 500 mL via INTRAVENOUS

## 2013-08-16 MED ORDER — CITRIC ACID-SODIUM CITRATE 334-500 MG/5ML PO SOLN: 30.0000 mL | ORAL | Status: DC | PRN

## 2013-08-16 MED ORDER — LACTATED RINGERS IV SOLN
500.0000 mL | INTRAVENOUS | Status: DC | PRN
Start: 2013-08-16 — End: 2013-08-17

## 2013-08-16 MED ORDER — OXYCODONE-ACETAMINOPHEN 5-325 MG PO TABS
1.0000 | ORAL_TABLET | ORAL | Status: DC | PRN
  Administered 2013-08-17: 1 via ORAL
  Filled 2013-08-16: qty 1

## 2013-08-16 MED ORDER — EPHEDRINE 5 MG/ML INJ
10.0000 mg | INTRAVENOUS | Status: DC | PRN
  Filled 2013-08-16: qty 2

## 2013-08-16 MED ORDER — FERROUS SULFATE 325 (65 FE) MG PO TABS
325.0000 mg | ORAL_TABLET | Freq: Three times a day (TID) | ORAL | Status: DC
  Filled 2013-08-16 (×2): qty 1

## 2013-08-16 MED ORDER — EPHEDRINE 5 MG/ML INJ
10.0000 mg | INTRAVENOUS | Status: DC | PRN
Start: 2013-08-16 — End: 2013-08-17
  Filled 2013-08-16: qty 2

## 2013-08-16 MED ORDER — LACTATED RINGERS IV SOLN: 500.0000 mL | Freq: Once | INTRAVENOUS | Status: DC

## 2013-08-16 MED ORDER — SODIUM CHLORIDE 0.9 % IV SOLN
2.0000 g | Freq: Once | INTRAVENOUS | Status: AC
  Administered 2013-08-16: 2 g via INTRAVENOUS
  Filled 2013-08-16: qty 2000

## 2013-08-16 MED ORDER — CITALOPRAM HYDROBROMIDE 20 MG PO TABS
20.0000 mg | ORAL_TABLET | Freq: Every day | ORAL | Status: DC
  Filled 2013-08-16: qty 1

## 2013-08-16 MED ORDER — FENTANYL 2.5 MCG/ML BUPIVACAINE 1/10 % EPIDURAL INFUSION (WH - ANES)
14.0000 mL/h | INTRAMUSCULAR | Status: DC | PRN
  Administered 2013-08-16 (×2): 14 mL/h via EPIDURAL
  Filled 2013-08-16: qty 125

## 2013-08-16 MED ORDER — ONDANSETRON HCL 4 MG/2ML IJ SOLN: 4.0000 mg | Freq: Four times a day (QID) | INTRAMUSCULAR | Status: DC | PRN

## 2013-08-16 MED ORDER — LACTATED RINGERS IV SOLN: INTRAVENOUS | Status: DC

## 2013-08-16 MED ORDER — IBUPROFEN 600 MG PO TABS: 600.0000 mg | ORAL_TABLET | Freq: Four times a day (QID) | ORAL | Status: DC | PRN

## 2013-08-16 NOTE — Anesthesia Procedure Notes (Signed)
Epidural Patient location during procedure: OB Start time: 08/16/2013 10:58 PM  Staffing Performed by: anesthesiologist   Preanesthetic Checklist Completed: patient identified, site marked, surgical consent, pre-op evaluation, timeout performed, IV checked, risks and benefits discussed and monitors and equipment checked  Epidural Patient position: sitting Prep: site prepped and draped and DuraPrep Patient monitoring: continuous pulse ox and blood pressure Approach: midline Location: L3-L4 Injection technique: LOR air  Needle:  Needle type: Tuohy  Needle gauge: 17 G Needle length: 9 cm and 9 Needle insertion depth: 5 cm cm Catheter type: closed end flexible Catheter size: 19 Gauge Catheter at skin depth: 10 cm Test dose: negative  Assessment Events: blood not aspirated, injection not painful, no injection resistance, negative IV test and no paresthesia  Additional Notes Discussed risk of headache, infection, bleeding, nerve injury and failed or incomplete block.  Patient voices understanding and wishes to proceed.  Epidural placed easily on first attempt.  No paresthesia.  Patient tolerated procedure well with no apparent complications.  Charlton Haws, MDReason for block:procedure for pain

## 2013-08-16 NOTE — Anesthesia Preprocedure Evaluation (Signed)
Anesthesia Evaluation  Patient identified by MRN, date of birth, ID band Patient awake    Reviewed: Allergy & Precautions, H&P , NPO status , Patient's Chart, lab work & pertinent test results, reviewed documented beta blocker date and time   History of Anesthesia Complications Negative for: history of anesthetic complications  Airway Mallampati: II TM Distance: >3 FB Neck ROM: full    Dental  (+) Teeth Intact   Pulmonary neg pulmonary ROS, former smoker,  breath sounds clear to auscultation        Cardiovascular negative cardio ROS  Rhythm:regular Rate:Normal     Neuro/Psych  Headaches, Depression Bipolar Disorder Schizophrenia Scoliosis - never treated    GI/Hepatic negative GI ROS, Neg liver ROS,   Endo/Other  negative endocrine ROS  Renal/GU negative Renal ROS  negative genitourinary   Musculoskeletal   Abdominal   Peds  Hematology  (+) Sickle cell trait and anemia ,   Anesthesia Other Findings   Reproductive/Obstetrics (+) Pregnancy                           Anesthesia Physical Anesthesia Plan  ASA: II  Anesthesia Plan: Epidural   Post-op Pain Management:    Induction:   Airway Management Planned:   Additional Equipment:   Intra-op Plan:   Post-operative Plan:   Informed Consent: I have reviewed the patients History and Physical, chart, labs and discussed the procedure including the risks, benefits and alternatives for the proposed anesthesia with the patient or authorized representative who has indicated his/her understanding and acceptance.     Plan Discussed with:   Anesthesia Plan Comments:         Anesthesia Quick Evaluation

## 2013-08-16 NOTE — Progress Notes (Signed)
Low-risk OB appointment [id number] [redacted]w[redacted]d Estimated Date of Delivery: 08/19/13 BP 106/60  Wt 147 lb (66.679 kg)  BP, weight, and urine reviewed.  Refer to obstetrical flow sheet for FH & FHR.  Reports good fm.  Denies regular uc's, lof, vb, or uti s/s. Lots of uc's yesterday, have kinda spaced out. Requests membrane stripping.  SVE: 4/70/-2, vtx Discussed GBS+ and unknown potential infection risks, pt wishes to proceed. Discussed r/b- pt decided to proceed, so membranes swept, now 4.5cm.  Reviewed labor s/s- to go to [facility] as soon as she feels like she may be in labor d/t favorable cx/multip/gbs+, also discussed fkc. Plan:  Continue routine obstetrical care  F/U in 1wk for OB appointment  IOL 8/20 @0730  for PD if needed

## 2013-08-16 NOTE — H&P (Signed)
LABOR ADMISSION HISTORY AND PHYSICAL  Jennifer Barber is a 28 y.o. female [phone] with IUP at [redacted]w[redacted]d by LMP and early [place] presenting for term labor. Pt presents w/ contractions. She reports +FMs, No LOF, no VB, no blurry vision, headaches or peripheral edema, and RUQ pain. She desires an epidural for labor pain control. She plans on breast and bottle feeding. She request nexplanon for birth control. Epidural ordered  Dating: By LMP --->  Estimated Date of Delivery: 08/19/13  Sono:   Anatomy WNL  Cervix 1.42 cm on TV u/s 06/08/13. Begin Prometrium , collect FFN -, initiate BMZ x 12mg  x2, monitor Cervix length weekly til stable.   06/15/13: 2.7cm!    6/16: 1.4cm w/ small amt funneling, down to 1.1 w/ fundal pressure   Prenatal History/Complications: Short Cervix -- Cervix 1.42 cm on TV u/s 06/08/13. Begin Prometrium , collect FFN -, initiate BMZ x 12mg  x2, monitor Cervix length weekly til stable.   06/15/13: 2.7cm!    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 Abnormal Pap smear   . Anemia   . Scoliosis   . Sickle cell trait   . Blood dyscrasia     Sickle Cell Trait  . Mental disorder     bipolar, schizophrenia  . Bipolar 1 disorder   . Schizophrenia   . Depression     Past Surgical History: Past Surgical History  Procedure Laterality Date  . Laparoscopic cholecystectomy      during second pregnancy  . Femoral osteotomy w/ rodding  2004    s/p accident , leg fx    Obstetrical History: OB History   Grav Para Term Preterm Abortions TAB SAB Ect Mult Living   5 3 3  0 1 0 1 0 0 3      Gynecological History: H/o abnormal pap HSV+  Social History: History   Social History  . Marital Status: Single    Spouse Name: N/A    Number of Children: N/A  .  Years of Education: N/A   Social History Main Topics  . Smoking status: Former Smoker -- 0.02 packs/day for 11 years    Types: Cigarettes  . Smokeless tobacco: Never Used  . Alcohol Use: No     Comment: pt denies smoking, drinking or drug use  . Drug Use: No     Comment: Pt had + UDS during pregnancy; Pt denied during admission history   . Sexual Activity: Not Currently    Birth Control/ Protection: None   Other Topics Concern  . None   Social History Narrative  . None    Family History: Family History  Problem Relation Age of Onset  . Cancer Mother   . Kidney disease Mother   . Alcohol abuse Mother   . Mental illness Mother   . Cancer Maternal Grandmother     Allergies: Allergies  Allergen Reactions  . Bee Venom Swelling  . Benadryl [Diphenhydramine] Swelling  . Codeine Itching  . Latex Rash    Prescriptions prior to admission  Medication Sig Dispense Refill  . acyclovir (ZOVIRAX) 400 MG tablet Take 1 tablet (400 mg total) by mouth 3 (three) times daily.  90 tablet  3  . butalbital-acetaminophen-caffeine (FIORICET, ESGIC) 50-325-40 MG per tablet  Take by mouth 2 (two) times daily as needed for headache.      . citalopram (CELEXA) 20 MG tablet Take 1 tablet (20 mg total) by mouth daily.  30 tablet  2  . docusate sodium (COLACE) 100 MG capsule Take 1 capsule (100 mg total) by mouth daily.  30 capsule  3  . HYDROcodone-acetaminophen (NORCO/VICODIN) 5-325 MG per tablet Take 1 tablet by mouth at bedtime as needed.  15 tablet  0  . hydrocortisone-pramoxine (PROCTOFOAM HC) rectal foam Place 1 applicator rectally 2 (two) times daily.  10 g  0  . Prenatal Vit-Fe Fumarate-FA (MULTIVITAMIN-PRENATAL) 27-0.8 MG TABS tablet Take 1 tablet by mouth daily at 12 noon.      . sodium chloride (OCEAN) 0.65 % SOLN nasal spray Place 1 spray into both nostrils as needed for congestion.      . ferrous sulfate (FERROUSUL) 325 (65 FE) MG tablet Take 1 tablet (325 mg total) by mouth 3 (three)  times daily with meals.  30 tablet  3     Review of Systems   All systems reviewed and negative except as stated in HPI  Resp. rate 20, SpO2 100.00%. General appearance: cooperative Lungs: clear to auscultation bilaterally Heart: regular rate and rhythm Abdomen: soft, non-tender; bowel sounds normal Pelvic: Adequate. No active HSV lesions. Extremities: Homans sign is negative, no sign of DVTPresentation: cephalic Fetal monitoringBaseline: 140 bpm, Variability: Good {> 6 bpm), Accelerations: Reactive and Decelerations: Absent Uterine activityFrequency: Every 2 minutes Dilation: 7 Effacement (%): 90 Exam by:: Jorje Guild RNC   Prenatal labs: ABO, Rh:  A+ Antibody: NEG (05/04 0915) Rubella:  Immunie RPR: NON REAC (05/04 0915)  HBsAg: NEGATIVE (02/10 1138)  HIV: NONREACTIVE (05/04 0915)  GBS: Detected (07/21 1431)  1 hr Glucola WNL Genetic screening  WNL Anatomy US WNL   Prenatal Transfer Tool  Maternal Diabetes: No Genetic Screening: Normal Maternal Ultrasounds/Referrals: Abnormal:  Findings:   Other: Cervix 1.42 cm on TV u/s 06/08/13. Begin Prometrium , collect FFN -, initiate BMZ x 12mg  x2, monitor Cervix length weekly til stable.   06/15/13: 2.7cm!    6/16: 1.4cm w/ small amt funneling, down to 1.1 w/ fundal pressure Fetal Ultrasounds or other Referrals:  None Maternal Substance Abuse:  No Significant Maternal Medications:  Meds include: Progesterone Other:  Celexa and acyclovir Significant Maternal Lab Results: Lab values include: Group B Strep positive, Other: HSV +     Results for orders placed during the hospital encounter of 08/16/13 (from the past 24 hour(s))  CBC   Collection Time    08/16/13 10:20 PM      Result Value Ref Range   WBC 12.2 (*) 4.0 - 10.5 K/uL   RBC 4.49  3.87 - 5.11 MIL/uL   Hemoglobin 12.1  12.0 - 15.0 g/dL   HCT 35.3 (*) 36.0 - 46.0 %   MCV 78.6  78.0 - 100.0 fL   MCH 26.9  26.0 - 34.0 pg   MCHC 34.3  30.0 - 36.0 g/dL   RDW 14.8  11.5  - 15.5 %   Platelets 230  150 - 400 K/uL  POCT URINALYSIS DIPSTICK   Collection Time    08/16/13 11:00 AM      Result Value Ref Range   Color, UA       Clarity, UA       Glucose, UA neg     Bilirubin, UA       Ketones, UA neg     Spec  Grav, UA       Blood, UA small     pH, UA       Protein, UA neg     Urobilinogen, UA       Nitrite, UA neg     Leukocytes, UA Trace      Patient Active Problem List   Diagnosis Date Noted  . Active labor at term 08/16/2013  . Cervix, short (affecting pregnancy) 06/08/2013  . Tension headache 05/21/2013  . Supervision of other normal pregnancy 02/16/2013  . 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 Depression   . Blood dyscrasia   . Bipolar 1 disorder   . Schizophrenia     Assessment: Jennifer Barber is a 28 y.o. [id number] at [redacted]w[redacted]d here for term labor w/ Cat I strip.  #Labor: Expectant management #Pain: Requests epidural #FWB: Cat I, reassuring #ID:  GBS +, will dose ampicillin #MOF: Breast #MOC: Nexplanon #Circ:  Desires as outpatient  # HSV+ : no active lesions, on acyclovir # Bipolar, Depression, Schizophrenia - continue celexa; SW c/w pot partum.  Tula Nakayama 08/16/2013, 11:31 PM

## 2013-08-16 NOTE — Patient Instructions (Signed)
Your induction is scheduled for 8/20 @ 7:30am. Go to [facility] hospital, Maternity Admissions Unit (Emergency) entrance and let them know you are there to be induced. They will send someone from Labor & Delivery to come get you.    Call the office [phone]) or go to [facility] if:  You begin to have strong, frequent contractions  Your water breaks.  Sometimes it is a big gush of fluid, sometimes it is just a trickle that keeps getting your panties wet or running down your legs  You have vaginal bleeding.  It is normal to have a small amount of spotting if your cervix was checked.   You don't feel your baby moving like normal.  If you don't, get you something to eat and drink and lay down and focus on feeling your baby move.  You should feel at least 10 movements in 2 hours.  If you don't, you should call the office or go to [place] Contractions Contractions of the uterus can occur throughout pregnancy. Contractions are not always a sign that you are in labor.  WHAT ARE BRAXTON HICKS CONTRACTIONS?  Contractions that occur before labor are called Braxton Hicks contractions, or false labor. Toward the end of pregnancy (32-34 weeks), these contractions can develop more often and may become more forceful. This is not true labor because these contractions do not result in opening (dilatation) and thinning of the cervix. They are sometimes difficult to tell apart from true labor because these contractions can be forceful and people have different pain tolerances. You should not feel embarrassed if you go to the hospital with false labor. Sometimes, the only way to tell if you are in true labor is for your health care provider to look for changes in the cervix. If there are no prenatal problems or other health problems associated with the pregnancy, it is completely safe to be sent home with false labor and await the onset of true labor. HOW CAN YOU TELL THE DIFFERENCE  BETWEEN TRUE AND FALSE LABOR? False Labor  The contractions of false labor are usually shorter and not as hard as those of true labor.   The contractions are usually irregular.   The contractions are often felt in the front of the lower abdomen and in the groin.   The contractions may go away when you walk around or change positions while lying down.   The contractions get weaker and are shorter lasting as time goes on.   The contractions do not usually become progressively stronger, regular, and closer together as with true labor.  True Labor  Contractions in true labor last 30-70 seconds, become very regular, usually become more intense, and increase in frequency.   The contractions do not go away with walking.   The discomfort is usually felt in the top of the uterus and spreads to the lower abdomen and low back.   True labor can be determined by your health care provider with an exam. This will show that the cervix is dilating and getting thinner.  WHAT TO REMEMBER  Keep up with your usual exercises and follow other instructions given by your health care provider.   Take medicines as directed by your health care provider.   Keep your regular prenatal appointments.   Eat and drink lightly if you think you are going into labor.   If Braxton Hicks contractions are making you uncomfortable:   Change your position from lying down or  resting to walking, or from walking to resting.   Sit and rest in a tub of warm water.   Drink 2-3 glasses of water. Dehydration may cause these contractions.   Do slow and deep breathing several times an hour.  WHEN SHOULD I SEEK IMMEDIATE MEDICAL CARE? Seek immediate medical care if:  Your contractions become stronger, more regular, and closer together.   You have fluid leaking or gushing from your vagina.   You have a fever.   You Cappelletti blood-tinged mucus.   You have vaginal bleeding.   You have continuous  abdominal pain.   You have low back pain that you never had before.   You feel your baby's head pushing down and causing pelvic pressure.   Your baby is not moving as much as it used to.  Document Released: 12/24/2004 Document Revised: 12/29/2012 Document Reviewed: 10/05/2012 [facility] Patient Information 2015 [place], [place]. This information is not intended to replace advice given to you by your health care provider. Make sure you discuss any questions you have with your health care provider.

## 2013-08-17 ENCOUNTER — Encounter (HOSPITAL_COMMUNITY): Payer: Self-pay | Admitting: *Deleted

## 2013-08-17 DIAGNOSIS — O98519 Other viral diseases complicating pregnancy, unspecified trimester: Secondary | ICD-10-CM

## 2013-08-17 DIAGNOSIS — D689 Coagulation defect, unspecified: Secondary | ICD-10-CM

## 2013-08-17 DIAGNOSIS — O9912 Other diseases of the blood and blood-forming organs and certain disorders involving the immune mechanism complicating childbirth: Secondary | ICD-10-CM

## 2013-08-17 DIAGNOSIS — O26879 Cervical shortening, unspecified trimester: Secondary | ICD-10-CM

## 2013-08-17 LAB — CBC
HCT: 33.4 % — ABNORMAL LOW (ref 36.0–46.0)
Hemoglobin: 11.3 g/dL — ABNORMAL LOW (ref 12.0–15.0)
MCH: 27.1 pg (ref 26.0–34.0)
MCHC: 33.8 g/dL (ref 30.0–36.0)
MCV: 80.1 fL (ref 78.0–100.0)
Platelets: 187 10*3/uL (ref 150–400)
RBC: 4.17 MIL/uL (ref 3.87–5.11)
RDW: 14.8 % (ref 11.5–15.5)
WBC: 16 10*3/uL — ABNORMAL HIGH (ref 4.0–10.5)

## 2013-08-17 LAB — RPR

## 2013-08-17 MED ORDER — SIMETHICONE 80 MG PO CHEW
80.0000 mg | CHEWABLE_TABLET | ORAL | Status: DC | PRN
Start: 2013-08-17 — End: 2013-08-19

## 2013-08-17 MED ORDER — PNEUMOCOCCAL VAC POLYVALENT 25 MCG/0.5ML IJ INJ
0.5000 mL | INJECTION | INTRAMUSCULAR | Status: AC
  Administered 2013-08-18: 0.5 mL via INTRAMUSCULAR
  Filled 2013-08-17: qty 0.5

## 2013-08-17 MED ORDER — SENNOSIDES-DOCUSATE SODIUM 8.6-50 MG PO TABS
2.0000 | ORAL_TABLET | ORAL | Status: DC
  Administered 2013-08-18: 2 via ORAL
  Filled 2013-08-17 (×2): qty 2

## 2013-08-17 MED ORDER — OXYCODONE-ACETAMINOPHEN 5-325 MG PO TABS
1.0000 | ORAL_TABLET | ORAL | Status: DC | PRN
  Administered 2013-08-17 – 2013-08-19 (×8): 1 via ORAL
  Filled 2013-08-17 (×8): qty 1

## 2013-08-17 MED ORDER — BENZOCAINE-MENTHOL 20-0.5 % EX AERO
1.0000 | INHALATION_SPRAY | CUTANEOUS | Status: DC | PRN
Start: 2013-08-17 — End: 2013-08-19

## 2013-08-17 MED ORDER — LANOLIN HYDROUS EX OINT
TOPICAL_OINTMENT | CUTANEOUS | Status: DC | PRN
Start: 2013-08-17 — End: 2013-08-19

## 2013-08-17 MED ORDER — DIBUCAINE 1 % RE OINT: 1.0000 "application " | TOPICAL_OINTMENT | RECTAL | Status: DC | PRN

## 2013-08-17 MED ORDER — IBUPROFEN 600 MG PO TABS
600.0000 mg | ORAL_TABLET | Freq: Four times a day (QID) | ORAL | Status: DC
  Administered 2013-08-17 – 2013-08-19 (×9): 600 mg via ORAL
  Filled 2013-08-17 (×11): qty 1

## 2013-08-17 MED ORDER — ZOLPIDEM TARTRATE 5 MG PO TABS
5.0000 mg | ORAL_TABLET | Freq: Every evening | ORAL | Status: DC | PRN
Start: 2013-08-17 — End: 2013-08-19

## 2013-08-17 MED ORDER — TETANUS-DIPHTH-ACELL PERTUSSIS 5-2.5-18.5 LF-MCG/0.5 IM SUSP
0.5000 mL | Freq: Once | INTRAMUSCULAR | Status: AC
  Administered 2013-08-18: 0.5 mL via INTRAMUSCULAR

## 2013-08-17 MED ORDER — ONDANSETRON HCL 4 MG/2ML IJ SOLN: 4.0000 mg | INTRAMUSCULAR | Status: DC | PRN

## 2013-08-17 MED ORDER — DIPHENHYDRAMINE HCL 25 MG PO CAPS: 25.0000 mg | ORAL_CAPSULE | Freq: Four times a day (QID) | ORAL | Status: DC | PRN

## 2013-08-17 MED ORDER — ONDANSETRON HCL 4 MG PO TABS: 4.0000 mg | ORAL_TABLET | ORAL | Status: DC | PRN

## 2013-08-17 MED ORDER — WITCH HAZEL-GLYCERIN EX PADS
1.0000 | MEDICATED_PAD | CUTANEOUS | Status: DC | PRN
Start: 2013-08-17 — End: 2013-08-19

## 2013-08-17 MED ORDER — PRENATAL MULTIVITAMIN CH
1.0000 | ORAL_TABLET | Freq: Every day | ORAL | Status: DC
  Administered 2013-08-17 – 2013-08-19 (×3): 1 via ORAL
  Filled 2013-08-17 (×3): qty 1

## 2013-08-17 NOTE — Lactation Note (Signed)
This note was copied from the chart of [place]. Lactation Consultation Note: Initial visit with this mom and baby- he is now 24 hours old. Mom reports he nursed very well after delivery but is sleepy now and won't latch. Reviewed normal newborn behavior the first 24 hours and cluster feeding. Mom is P4- first time BF. Mom asking about what kind of formula to use. Encouraged to only breast feed the first few Jesly Hartmann to promote a good milk supply.  Mom pleased that he is nursing so well. BF brochure given with resources for support after DC. No questions at present. Encouraged to page for assist prn  Patient Name: Jennifer Barber WCBJS'E Date: 08/17/2013 Reason for consult: Initial assessment   Maternal Data Formula Feeding for Exclusion: Yes Reason for exclusion: Mother's choice to formula and breast feed on admission Does the patient have breastfeeding experience prior to this delivery?: No  Feeding Feeding Type: Breast Fed Length of feed: 23 min  LATCH Score/Interventions                      Lactation Tools Discussed/Used     Consult Status Consult Status: Follow-up Date: 08/18/13 Follow-up type: In-patient    Truddie Crumble 08/17/2013, 9:50 AM

## 2013-08-17 NOTE — Anesthesia Postprocedure Evaluation (Signed)
Anesthesia Post Note  Patient: Jennifer Barber  Procedure(s) Performed: * No procedures listed *  Anesthesia type: Epidural  Patient location: Mother/Baby  Post pain: Pain level controlled  Post assessment: Post-op Vital signs reviewed  Last Vitals:  Filed Vitals:   08/17/13 0400  BP: 110/53  Pulse: 72  Temp: 36.9 C  Resp: 18    Post vital signs: Reviewed  Level of consciousness:alert  Complications: No apparent anesthesia complications

## 2013-08-17 NOTE — Progress Notes (Signed)
CPS case has been accepted in [facility], and Orlene Plum has been assigned the case.  Per [facility], CPS will complete assessment at the hospital on 8/12.   CSW notified MOB and FOB of assessment that will occur with CPS.  MOB and FOB continue to express anxiety about the case, and continued to verbalize not understanding why a CPS report needed to be made.  CSW validated their frustration and sense of loss of control, but stated that a report was made based on MOB's history.    CSW to continue to follow the case.
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
## 2013-08-17 NOTE — Progress Notes (Signed)
UR completed 

## 2013-08-18 ENCOUNTER — Encounter: Payer: Medicaid Other | Admitting: Advanced Practice Midwife

## 2013-08-18 ENCOUNTER — Encounter: Payer: Medicaid Other | Admitting: Women's Health

## 2013-08-18 NOTE — Progress Notes (Signed)
[place] worker Orlene Plum met with MOB and FOB to complete assessment.  CPS has requested a TDM to be scheduled for 8/13 at 10:30 am.  CSW reserved classroom 1 for the meeting.   Per CPS, discharge plan is still unknown, as they are currently exploring family members who may be able to assist with the care of the newborn once discharged.

## 2013-08-18 NOTE — Progress Notes (Signed)
CSW spoke with MOB in order to explore thoughts and feelings prior to the TDM.  MOB continues to express not understanding why there needs to be a TDM.  CSW continues to review and outline concerns that led to CPS report and TDM.  She stated that she is going to primarily allow the FOB to talk since "everything I say gets used against me".  MOB began to present as paranoid as she shared belief that CSW and CPS are "out to get me" in order to take her baby away.  She shared belief that we are viewing her as a "threat" to society.  CSW attempted to validate her frustrations with the process, but MOB responded minimally and continued to presents as paranoid about CPS involvement and concerns.  CSW noted that her responses are similar to previous statements that MOB has made when referring to previous CPS case that led to her losing custody of her children.  She previously had reported that CPS "set me up to fail" with her other children.   CSW offered to accompany MOB and her family to the TDM on 8/13.  MOB agreeable.

## 2013-08-18 NOTE — Progress Notes (Signed)
Post Partum Day 1  Subjective:  Jennifer Barber is a 28 y.o. [id number] s/p SVD.  No acute events overnight.  Pt denies problems with ambulating, voiding or po intake.  She  denies nausea or vomiting.  Pain is well controlled. Lochia Moderate.  Plan for birth control is nexplanon.  Method of Feeding: bottle, stopped breast feeding this morning.  Objective: Blood pressure 109/70, pulse 68, temperature 98.1 F (36.7 C), temperature source Oral, resp. rate 20, height 5' (1.524 m), weight 66.225 kg (146 lb), SpO2 99.00%, unknown if currently breastfeeding.  Physical Exam:  General: alert, cooperative and no distress Lochia:normal flow Chest: CTAB Heart: RRR no m/r/g Abdomen: +BS, soft, nontender,  Uterine Fundus: firm DVT Evaluation: No evidence of DVT seen on physical exam. Extremities: no edema   Recent Labs  08/16/13 2220 08/17/13 0500  HGB 12.1 11.3*  HCT 35.3* 33.4*    Assessment/Plan:  ASSESSMENT: Jennifer Barber is a 28 y.o. [id number] s/p SVD, significant hx of schizophrenia and bipolar disorder - routine postpartum care   LOS: 2 days   Jennifer Barber Jennifer Barber 08/18/2013, 8:19 AM

## 2013-08-19 MED ORDER — IBUPROFEN 600 MG PO TABS: 600.0000 mg | ORAL_TABLET | Freq: Four times a day (QID) | ORAL | Status: DC

## 2013-08-19 NOTE — Discharge Summary (Signed)
Obstetric Discharge Summary Reason for Admission: onset of labor Prenatal Procedures: NST Intrapartum Procedures: spontaneous vaginal delivery and GBS prophylaxis Postpartum Procedures: none Complications-Operative and Postpartum: first degree perineal laceration Hemoglobin  Date Value Ref Range Status  08/17/2013 11.3* 12.0 - 15.0 g/dL Final     HCT  Date Value Ref Range Status  08/17/2013 33.4* 36.0 - 46.0 % Final   Hospital Course:  Jennifer Barber is a 28 y.o. female [phone] with IUP at 71w4dby LMP and early UKoreapresenting for term labor. Pt presents w/ contractions. She reports +FMs, No LOF, no VB, no blurry vision, headaches or peripheral edema, and RUQ pain. She desires an epidural for labor pain control. She plans on breast and bottle feeding. She request nexplanon for birth control. Epidural ordered.  She was admitted to L&D where she subsequently progressed without incident to NSVD. Her intrapartum course was uncomplicated and her postpartum course remained uncomplicated. However, her social situation is complicated by mother's history of bipolar 1 disorder with depression as well as Schizophrenia both of which are untreated at this time. Additionally, patient does not have custody of her other children. Pt. Met with social work and CPS on 8/13. She now is ambulating, tolerating po, +BM, minimal bleeding, pain well controlled. She is stable and ready for discharge.     Delivery Note  PRE-OPERATIVE DIAGNOSIS:  1) 377w5dregnancy.  POST-OPERATIVE DIAGNOSIS:  1) 398w5degnancy s/p Vaginal, Spontaneous Delivery  Delivery Type: Vaginal, Spontaneous Delivery  Delivery Clinician: ROBDelice Leschelivery Anesthesia: Epidural  Labor Complications: None  Lacerations: Periuretheral, hemostatic  ESTIMATED BLOOD LOSS: 200 cc  Labor course: This is a 28 77o. y.o. female [id number] came in at 39w59w5dgnancy complaining of contractions. 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 Initial cervical exam was 7/100/0. She was admitted to L and D. Labor course included:  Epidural placement  SROM with clear fluid  Procedure: Vaginal, Spontaneous Delivery  Date of birth: 08/17/2013  Time of birth: 12:10 AM  This [id number] under epidural anesthesia delivered a viable female infant weighing 7 lbs 13 oz with Apgars as listed below. Delivery was via NSVD  Delivery completed and cord cut and clamped. Infant dried and stimulated. Infant to maternal abdomen. Cord pH not obtained. Active management of the third stage of labor performed. Intact placenta delivered spontaneously at 08/17/2013 @ 12:17AM. Vagina and cervix explored and periuretheral lacerations noted good approximation of tissue and hemostasis. Uterus well contracted at end of delivery. Mother and infant tolerated delivery well.  FINDINGS:  1) female infant, Apgar scores of at 1 minute  at 5 minutes  2) 3 Vessel Cord  3) Nuchal: No  SPECIMENS: Placenta Discarded; Cord gases not sent  COMPLICATIONS: none  DISPOSITION:  Infant to NBN    Physical Exam:  General: alert, cooperative and no distress Lochia: appropriate Uterine Fundus: firm Incision: N/A DVT Evaluation: No evidence of DVT seen on physical exam. No cords or calf tenderness.  Discharge Diagnoses: Term Pregnancy-delivered  Discharge Information: Date: 08/19/2013 Activity: unrestricted and pelvic rest Diet: routine Medications: PNV and Ibuprofen, Celexa Condition: stable Instructions: refer to practice specific booklet Discharge to: home Follow-up Information   [facility] Schedule an appointment as soon as possible for a visit in 6 weeks. (postpartum follow up, Nexplanon)    Specialty:  Obstetrics and Gynecology   Contact information:  [address] [place] [address] [phone]      Newborn Data: Live born female   Birth Weight: 7 lb 13.4 oz (3555 g) APGAR: 8, 9  Home with mother.  [place], Caleb G 08/19/2013, 7:29 AM  I have seen and examined this patient and I agree with the above. Serita Grammes CNM 12:15 AM 08/21/2013

## 2013-08-19 NOTE — Progress Notes (Signed)
CSW participated in the TDM. Due to MOB's mental health diagnoses and past history with CPS that lead to MOB not having custody of her other children, TDM determined that baby will be discharged to the paternal grandmother, Imagene Riches.  The safety plan outlines that MOB is not allowed to be alone with the baby, and is only allowed supervised visitations. FOB is allowed to be alone with the baby.  MOB and FOB agreeable to following all recommendations including MOB participating in a mental health and parenting assessment.    PGM is aware of need to make appointment with pediatrician prior to discharge.  When CSW left TDM, PGM was making phone call to pediatrician office.    MOB and FOB presented as coping as expected with the news, and they expressed motivation to complete CPS mandates in order to regain physical custody of the baby.

## 2013-08-19 NOTE — Lactation Note (Signed)
This note was copied from the chart of [place]. Lactation Consultation Note New mom had been bottle formula feeding, decided to put the baby to the breast to BF. Baby is latched well in cradle position per mom, assisted in chin tug for a wider latch. Heard audible swallows. Breast are filling, hand expression taught w/noted amount of colostrum. Encouraged mom to massage breast during feeding to release colostrum more during feedings. Discussed engorgement prevention and soreness. Noted Lt. Nipple slightly red, mom had put lanolin on nipple. Had comfort gels at bedside and encouraged not to use lanolin w/comfort gels d/t will break comfort gels down. Basic BF information went over again. Encouraged to BF first before giving supplementation if she wants to BF and have a good milk supply.  Patient Name: Jennifer Barber [id number] Date: 08/19/2013 Reason for consult: Follow-up assessment   Maternal Data    Feeding Feeding Type: Breast Fed Length of feed: 20 min (still feeding)  LATCH Score/Interventions Latch: Grasps breast easily, tongue down, lips flanged, rhythmical sucking. Intervention(s): Adjust position;Assist with latch;Breast massage;Breast compression  Audible Swallowing: Spontaneous and intermittent Intervention(s): Hand expression;Alternate breast massage  Type of Nipple: Everted at rest and after stimulation  Comfort (Breast/Nipple): Filling, red/small blisters or bruises, mild/mod discomfort Intervention(s): Expressed breast milk to nipple  Problem noted: Filling;Mild/Moderate discomfort Interventions (Filling): Massage Interventions (Mild/moderate discomfort): Comfort gels;Hand massage;Hand expression  Hold (Positioning): Assistance needed to correctly position infant at breast and maintain latch. Intervention(s): Breastfeeding basics reviewed;Support Pillows;Position options;Skin to skin  LATCH Score: 8  Lactation Tools Discussed/Used     Consult  Status Consult Status: Follow-up Date: 08/19/13 Follow-up type: In-patient    Theodoro Kalata 08/19/2013, 5:44 AM

## 2013-08-19 NOTE — Discharge Instructions (Signed)
Before [facility] Ask any questions about feeding, diapering, and baby care before you leave the hospital. Ask again if you do not understand. Ask when you need to see the doctor again. There are several things you must have before your baby comes home.  Infant car seat.  Crib.  Do not let your baby sleep in a bed with you or anyone else.  If you do not have a bed for your baby, ask the doctor what you can use that will be safe for the baby to sleep in. Infant feeding supplies:  6 to 8 bottles (8 ounce size).  6 to 8 nipples.  Measuring cup.  Measuring tablespoon.  Bottle brush.  Sterilizer (or use any large pan or kettle with a lid).  Formula that contains iron.  A way to boil and cool water. Breastfeeding supplies:  Breast pump.  Nipple cream. Clothing:  24 to 36 cloth diapers and waterproof diaper covers or a box of disposable diapers. You may need as many as 10 to 12 diapers per day.  3 onesies (other clothing will depend on the time of year and the weather).  3 receiving blankets.  3 baby pajamas or gowns.  3 bibs. Bath equipment:  Mild soap.  Petroleum jelly. No baby oil or powder.  Soft cloth towel and washcloth.  Cotton balls.  Separate bath basin for baby. Only sponge bathe until umbilical cord and circumcision are healed. Other supplies:  Thermometer and bulb syringe (ask the hospital to send them home with you). Ask your doctor about how you should take your baby's temperature.  One to two pacifiers. Prepare for an emergency:  Know how to get to the hospital and know where to admit your baby.  Put all doctor numbers near your house phone and in your cell phone if you have one. Prepare your family:  Talk with siblings about the baby coming home and how they feel about it.  Decide how you want to handle visitors and other family members.  Take offers for help with the baby. You will need time to adjust. Know when to call the  doctor.  GET HELP RIGHT AWAY IF:  Your baby's temperature is greater than 100.37F (38C).  The soft spot on your baby's head starts to bulge.  Your baby is crying with no tears or has no wet diapers for 6 hours.  Your baby has rapid breathing.  Your baby is not as alert. Document Released: 12/07/2007 Document Revised: 05/10/2013 Document Reviewed: 03/15/2010 [facility] Patient Information 2015 [place], [place]. This information is not intended to replace advice given to you by your health care provider. Make sure you discuss any questions you have with your health care provider. Vaginal Delivery, Care After Refer to this sheet in the next few weeks. These discharge instructions provide you with information on caring for yourself after delivery. Your caregiver may also give you specific instructions. Your treatment has been planned according to the most current medical practices available, but problems sometimes occur. Call your caregiver if you have any problems or questions after you go home. HOME CARE INSTRUCTIONS  Take over-the-counter or prescription medicines only as directed by your caregiver or pharmacist.  Do not drink alcohol, especially if you are breastfeeding or taking medicine to relieve pain.  Do not chew or smoke tobacco.  Do not use illegal drugs.  Continue to use good perineal care. Good perineal care includes:  Wiping your perineum from front to back.  Keeping your  perineum clean.  Do not use tampons or douche until your caregiver says it is okay.  Shower, wash your hair, and take tub baths as directed by your caregiver.  Wear a well-fitting bra that provides breast support.  Eat healthy foods.  Drink enough fluids to keep your urine clear or pale yellow.  Eat high-fiber foods such as whole grain cereals and breads, brown rice, beans, and fresh fruits and vegetables every day. These foods may help prevent or relieve constipation.  Follow your caregiver's  recommendations regarding resumption of activities such as climbing stairs, driving, lifting, exercising, or traveling.  Talk to your caregiver about resuming sexual activities. Resumption of sexual activities is dependent upon your risk of infection, your rate of healing, and your comfort and desire to resume sexual activity.  Try to have someone help you with your household activities and your newborn for at least a few days after you leave the hospital.  Rest as much as possible. Try to rest or take a nap when your newborn is sleeping.  Increase your activities gradually.  Keep all of your scheduled postpartum appointments. It is very important to keep your scheduled follow-up appointments. At these appointments, your caregiver will be checking to make sure that you are healing physically and emotionally. SEEK MEDICAL CARE IF:   You are passing large clots from your vagina. Save any clots to show your caregiver.  You have a foul smelling discharge from your vagina.  You have trouble urinating.  You are urinating frequently.  You have pain when you urinate.  You have a change in your bowel movements.  You have increasing redness, pain, or swelling near your vaginal incision (episiotomy) or vaginal tear.  You have pus draining from your episiotomy or vaginal tear.  Your episiotomy or vaginal tear is separating.  You have painful, hard, or reddened breasts.  You have a severe headache.  You have blurred vision or see spots.  You feel sad or depressed.  You have thoughts of hurting yourself or your newborn.  You have questions about your care, the care of your newborn, or medicines.  You are dizzy or light-headed.  You have a rash.  You have nausea or vomiting.  You were breastfeeding and have not had a menstrual period within 12 weeks after you stopped breastfeeding.  You are not breastfeeding and have not had a menstrual period by the 12th week after  delivery.  You have a fever. SEEK IMMEDIATE MEDICAL CARE IF:   You have persistent pain.  You have chest pain.  You have shortness of breath.  You faint.  You have leg pain.  You have stomach pain.  Your vaginal bleeding saturates two or more sanitary pads in 1 hour. MAKE SURE YOU:   Understand these instructions.  Will watch your condition.  Will get help right away if you are not doing well or get worse. Document Released: 12/22/1999 Document Revised: 05/10/2013 Document Reviewed: 08/21/2011 [facility] Patient Information 2015 [place], [place]. This information is not intended to replace advice given to you by your health care provider. Make sure you discuss any questions you have with your health care provider.

## 2013-08-19 NOTE — Lactation Note (Signed)
This note was copied from the chart of [place]. Lactation Consultation Note  Patient Name: Valina Maes [id number] Date: 08/19/2013 Reason for consult: Follow-up assessment Night shift RN gave Mom comfort gels due to nipple tenderness.   Maternal Data    Feeding    LATCH Score/Interventions                      Lactation Tools Discussed/Used Tools: Pump Breast pump type: Manual   Consult Status Consult Status: Complete Date: 08/19/13 Follow-up type: In-patient    Katrine Coho 08/19/2013, 2:09 PM

## 2013-08-19 NOTE — Lactation Note (Addendum)
This note was copied from the chart of [place]. Lactation Consultation Note  Patient Name: Jennifer Barber [id number] Date: 08/19/2013 Reason for consult: Follow-up assessment Mom is wanting assist with obtaining a pump to provide breast milk to her baby. This baby will be going home with Grandmother to [facility]. Mom will be in [facility]. She has not transferred her [facility] from [facility] to [place] yet, she had appointment this week. Contacted Marina Goodell with [facility] office in [place] and she advised they will have DEBP available for Mom tomorrow morning if Mom can be at the [facility] office by 8:15-8:30 am. Mom plans to keep this appointment to obtain her pump. In the meantime, gave Mom Harmony Hand Pump and demonstrated use/cleaning to Mom. Advised to pump every 3 hours for 15 minutes both breasts. Mom's milk is in, breasts are firm but not engorged. Engorgement care reviewed if needed. DEBP kit given to Mom for use with WIC pump. Pump/storage guidelines reviewed with Mom. Mom reports some mild nipple tenderness, 2nd pair of comfort gels given with instructions. Mom lost one of the 1st pair. No breakdown noted.   Maternal Data    Feeding    LATCH Score/Interventions                      Lactation Tools Discussed/Used Tools: Pump Breast pump type: Manual   Consult Status Consult Status: Complete Date: 08/19/13 Follow-up type: In-patient    Katrine Coho 08/19/2013, 2:03 PM

## 2013-08-23 ENCOUNTER — Encounter: Payer: Medicaid Other | Admitting: Women's Health

## 2013-08-26 ENCOUNTER — Inpatient Hospital Stay (HOSPITAL_COMMUNITY): Admission: RE | Admit: 2013-08-26 | Payer: Medicaid Other | Source: Ambulatory Visit

## 2013-09-06 ENCOUNTER — Telehealth: Payer: Self-pay | Admitting: *Deleted

## 2013-09-06 NOTE — Telephone Encounter (Signed)
Pt states needs a note faxed to [phone], verification number [id number] stating date pt delivered and when pt would be able to return to work.

## 2013-09-20 ENCOUNTER — Ambulatory Visit (INDEPENDENT_AMBULATORY_CARE_PROVIDER_SITE_OTHER): Payer: Medicaid Other | Admitting: Women's Health

## 2013-09-20 ENCOUNTER — Encounter: Payer: Self-pay | Admitting: Women's Health

## 2013-09-20 DIAGNOSIS — Z3009 Encounter for other general counseling and advice on contraception: Secondary | ICD-10-CM

## 2013-09-20 NOTE — Patient Instructions (Signed)
NO SEX UNTIL AFTER NEXPLANON PUT IN  Etonogestrel implant- Nexplanon What is this medicine? ETONOGESTREL (et oh noe JES trel) is a contraceptive (birth control) device. It is used to prevent pregnancy. It can be used for up to 3 years. This medicine may be used for other purposes; ask your health care provider or pharmacist if you have questions. COMMON BRAND NAME(S): Implanon, Nexplanon What should I tell my health care provider before I take this medicine? They need to know if you have any of these conditions: -abnormal vaginal bleeding -blood vessel disease or blood clots -cancer of the breast, cervix, or liver -depression -diabetes -gallbladder disease -headaches -heart disease or recent heart attack -high blood pressure -high cholesterol -kidney disease -liver disease -renal disease -seizures -tobacco smoker -an unusual or allergic reaction to etonogestrel, other hormones, anesthetics or antiseptics, medicines, foods, dyes, or preservatives -pregnant or trying to get pregnant -breast-feeding How should I use this medicine? This device is inserted just under the skin on the inner side of your upper arm by a health care professional. Talk to your pediatrician regarding the use of this medicine in children. Special care may be needed. Overdosage: If you think you've taken too much of this medicine contact a poison control center or emergency room at once. Overdosage: If you think you have taken too much of this medicine contact a poison control center or emergency room at once. NOTE: This medicine is only for you. Do not share this medicine with others. What if I miss a dose? This does not apply. What may interact with this medicine? Do not take this medicine with any of the following medications: -amprenavir -bosentan -fosamprenavir This medicine may also interact with the following medications: -barbiturate medicines for inducing sleep or treating seizures -certain  medicines for fungal infections like ketoconazole and itraconazole -griseofulvin -medicines to treat seizures like carbamazepine, felbamate, oxcarbazepine, phenytoin, topiramate -modafinil -phenylbutazone -rifampin -some medicines to treat HIV infection like atazanavir, indinavir, lopinavir, nelfinavir, tipranavir, ritonavir -St. John's wort This list may not describe all possible interactions. Give your health care provider a list of all the medicines, herbs, non-prescription drugs, or dietary supplements you use. Also tell them if you smoke, drink alcohol, or use illegal drugs. Some items may interact with your medicine. What should I watch for while using this medicine? This product does not protect you against HIV infection (AIDS) or other sexually transmitted diseases. You should be able to feel the implant by pressing your fingertips over the skin where it was inserted. Tell your doctor if you cannot feel the implant. What side effects may I notice from receiving this medicine? Side effects that you should report to your doctor or health care professional as soon as possible: -allergic reactions like skin rash, itching or hives, swelling of the face, lips, or tongue -breast lumps -changes in vision -confusion, trouble speaking or understanding -dark urine -depressed mood -general ill feeling or flu-like symptoms -light-colored stools -loss of appetite, nausea -right upper belly pain -severe headaches -severe pain, swelling, or tenderness in the abdomen -shortness of breath, chest pain, swelling in a leg -signs of pregnancy -sudden numbness or weakness of the face, arm or leg -trouble walking, dizziness, loss of balance or coordination -unusual vaginal bleeding, discharge -unusually weak or tired -yellowing of the eyes or skin Side effects that usually do not require medical attention (Report these to your doctor or health care professional if they continue or are  bothersome.): -acne -breast pain -changes in weight -cough -fever  or chills -headache -irregular menstrual bleeding -itching, burning, and vaginal discharge -pain or difficulty passing urine -sore throat This list may not describe all possible side effects. Call your doctor for medical advice about side effects. You may report side effects to FDA at 1-800-FDA-1088. Where should I keep my medicine? This drug is given in a hospital or clinic and will not be stored at home. NOTE: This sheet is a summary. It may not cover all possible information. If you have questions about this medicine, talk to your doctor, pharmacist, or health care provider.  2015, Elsevier/Gold Standard. (2011-07-01 15:37:45)

## 2013-09-20 NOTE — Progress Notes (Signed)
Patient ID: AVIV ROTA, female   DOB: [birth year], 28 y.o.   MRN: [id number] Subjective:    Jennifer Barber is a 28 y.o. [id number] African American female who presents for a postpartum visit. She is 4 weeks postpartum following a spontaneous vaginal delivery at 39.5 gestational weeks. Anesthesia: epidural. I have fully reviewed the prenatal and intrapartum course. Postpartum course has been uncomplicated. 52 course has been uncomplicated- baby d/c'd from hospital to custody of paternal grandparents d/t pt's mental health hx and not having custody of any of other children.  She states she has done everything they have wanted her to so far, and they will be doing a home assessment soon to see if she can gain custody back. Baby is feeding by bottle. Bleeding no bleeding. Bowel function is normal. Bladder function is normal. Patient is sexually active. Last sexual activity: last night, 8/13. Contraception method is none. Wants nexplanon. Postpartum depression screening: negative. Score 0.  Doing well on celexa she started during pregnancy. Last pap 08/2012 and was normal at [facility].  The following portions of the patient's history were reviewed and updated as appropriate: allergies, current medications, past medical history, past surgical history and problem list.  Review of Systems Pertinent items are noted in HPI.   Filed Vitals:   09/20/13 0859  BP: 114/74  Height: 5\' 2"  (1.575 m)  Weight: 133 lb (60.328 kg)   No LMP recorded.  Objective:   General:  alert, cooperative and no distress   Breasts:  deferred, no complaints  Lungs: clear to auscultation bilaterally  Heart:  regular rate and rhythm  Abdomen: soft, nontender   Vulva: normal  Vagina: normal vagina  Cervix:  closed  Corpus: Well-involuted  Adnexa:  Non-palpable  Rectal Exam: No hemorrhoids        Assessment:   Postpartum exam 4 wks s/p SVD Doesn't currently have custody of baby, trying to get baby  back Bottlefeeding Depression screening Contraception counseling  Recent unprotected sex  Plan:   Contraception: abstinence until nexplanon placed Follow up in: 9/23 for [facility] am and nexplanon pm or earlier if needed Work note given to resume work 9/22 @ 6wks pp per her request  Tawnya Crook CNM, WHNP-BC 09/20/2013 9:08 AM   i

## 2013-09-29 ENCOUNTER — Encounter: Payer: Self-pay | Admitting: Women's Health

## 2013-09-29 ENCOUNTER — Other Ambulatory Visit: Payer: Medicaid Other

## 2013-09-29 ENCOUNTER — Ambulatory Visit (INDEPENDENT_AMBULATORY_CARE_PROVIDER_SITE_OTHER): Payer: Medicaid Other | Admitting: Women's Health

## 2013-09-29 VITALS — BP 114/74 | Ht 62.0 in | Wt 132.0 lb

## 2013-09-29 DIAGNOSIS — Z3202 Encounter for pregnancy test, result negative: Secondary | ICD-10-CM

## 2013-09-29 DIAGNOSIS — Z30017 Encounter for initial prescription of implantable subdermal contraceptive: Secondary | ICD-10-CM

## 2013-09-29 LAB — HCG, QUANTITATIVE, PREGNANCY: hCG, Beta Chain, Quant, S: <1 m[IU]/mL

## 2013-09-29 NOTE — Patient Instructions (Signed)
Keep the area clean and dry.  You can remove the big bandage in 24 hours, and the small steri-strip bandage in 3-5 days.  A back up method, such as condoms, should be used for two weeks. You may have irregular vaginal bleeding for the first 6 months after the Nexplanon is placed, then the bleeding usually lightens and it is possible that you may not have any periods.  If you have any concerns, please give us a call.   Etonogestrel implant- Nexplanon What is this medicine? ETONOGESTREL (et oh noe JES trel) is a contraceptive (birth control) device. It is used to prevent pregnancy. It can be used for up to 3 years. This medicine may be used for other purposes; ask your health care provider or pharmacist if you have questions. COMMON BRAND NAME(S): Implanon, Nexplanon What should I tell my health care provider before I take this medicine? They need to know if you have any of these conditions: -abnormal vaginal bleeding -blood vessel disease or blood clots -cancer of the breast, cervix, or liver -depression -diabetes -gallbladder disease -headaches -heart disease or recent heart attack -high blood pressure -high cholesterol -kidney disease -liver disease -renal disease -seizures -tobacco smoker -an unusual or allergic reaction to etonogestrel, other hormones, anesthetics or antiseptics, medicines, foods, dyes, or preservatives -pregnant or trying to get pregnant -breast-feeding How should I use this medicine? This device is inserted just under the skin on the inner side of your upper arm by a health care professional. Talk to your pediatrician regarding the use of this medicine in children. Special care may be needed. Overdosage: If you think you've taken too much of this medicine contact a poison control center or emergency room at once. Overdosage: If you think you have taken too much of this medicine contact a poison control center or emergency room at once. NOTE: This medicine is only  for you. Do not share this medicine with others. What if I miss a dose? This does not apply. What may interact with this medicine? Do not take this medicine with any of the following medications: -amprenavir -bosentan -fosamprenavir This medicine may also interact with the following medications: -barbiturate medicines for inducing sleep or treating seizures -certain medicines for fungal infections like ketoconazole and itraconazole -griseofulvin -medicines to treat seizures like carbamazepine, felbamate, oxcarbazepine, phenytoin, topiramate -modafinil -phenylbutazone -rifampin -some medicines to treat HIV infection like atazanavir, indinavir, lopinavir, nelfinavir, tipranavir, ritonavir -St. John's wort This list may not describe all possible interactions. Give your health care provider a list of all the medicines, herbs, non-prescription drugs, or dietary supplements you use. Also tell them if you smoke, drink alcohol, or use illegal drugs. Some items may interact with your medicine. What should I watch for while using this medicine? This product does not protect you against HIV infection (AIDS) or other sexually transmitted diseases. You should be able to feel the implant by pressing your fingertips over the skin where it was inserted. Tell your doctor if you cannot feel the implant. What side effects may I notice from receiving this medicine? Side effects that you should report to your doctor or health care professional as soon as possible: -allergic reactions like skin rash, itching or hives, swelling of the face, lips, or tongue -breast lumps -changes in vision -confusion, trouble speaking or understanding -dark urine -depressed mood -general ill feeling or flu-like symptoms -light-colored stools -loss of appetite, nausea -right upper belly pain -severe headaches -severe pain, swelling, or tenderness in the abdomen -shortness of   breath, chest pain, swelling in a leg -signs  of pregnancy -sudden numbness or weakness of the face, arm or leg -trouble walking, dizziness, loss of balance or coordination -unusual vaginal bleeding, discharge -unusually weak or tired -yellowing of the eyes or skin Side effects that usually do not require medical attention (Report these to your doctor or health care professional if they continue or are bothersome.): -acne -breast pain -changes in weight -cough -fever or chills -headache -irregular menstrual bleeding -itching, burning, and vaginal discharge -pain or difficulty passing urine -sore throat This list may not describe all possible side effects. Call your doctor for medical advice about side effects. You may report side effects to FDA at 1-800-FDA-1088. Where should I keep my medicine? This drug is given in a hospital or clinic and will not be stored at home. NOTE: This sheet is a summary. It may not cover all possible information. If you have questions about this medicine, talk to your doctor, pharmacist, or health care provider.  2015, Elsevier/Gold Standard. (2011-07-01 15:37:45)  

## 2013-09-29 NOTE — Progress Notes (Signed)
Jennifer Barber is a 29 y.o. year old African American female here for Nexplanon insertion.  Patient's last menstrual period was 09/23/2013., last sexual intercourse was prior to last visit, and her pregnancy test today was negative.  She is also just coming off of her period. Risks/benefits/side effects of Nexplanon have been discussed and her questions have been answered.  Specifically, a failure rate of 01/998 has been reported, with an increased failure rate if pt takes [place] and/or antiseizure medicaitons.  [name] is aware of the common side effect of irregular bleeding, which the incidence of decreases over time.  Doing well on celexa, seeing counselor in [place]. Understands nexplanon may potentially worsen depression, if she feels like this is happening she should notify us/counselor immediately.   BP 114/74  Ht 5\' 2"  (1.575 m)  Wt 132 lb (59.875 kg)  BMI 24.14 kg/m2  LMP 09/23/2013  Breastfeeding? No  Results for orders placed in visit on 09/29/13 (from the past 24 hour(s))  HCG, QUANTITATIVE, PREGNANCY   Collection Time    09/29/13  9:15 AM      Result Value Ref Range   hCG, Beta Chain, Quant, S <1.0       She is right-handed, so her left arm, approximately 4 inches proximal from the elbow, was cleansed with alcohol and anesthetized with 2cc of 2% Lidocaine.  The area was cleansed again with betadine and the Nexplanon was inserted per manufacturer's recommendations without difficulty.  A steri-strip and pressure bandage were applied.  Pt was instructed to keep the area clean and dry, remove pressure bandage in 24 hours, and keep insertion site covered with the steri-strip for 3-5 days.  Back up contraception was recommended for 2 weeks.  She was given a card indicating date Nexplanon was inserted and date it needs to be removed. Follow-up PRN problems.  Tawnya Crook CNM, [facility] 09/29/2013 2:40 PM

## 2013-11-08 ENCOUNTER — Encounter: Payer: Self-pay | Admitting: Women's Health

## 2014-03-28 DIAGNOSIS — Z029 Encounter for administrative examinations, unspecified: Secondary | ICD-10-CM

## 2015-04-28 ENCOUNTER — Ambulatory Visit: Payer: Medicaid Other | Attending: Internal Medicine | Admitting: Physician Assistant

## 2015-04-28 VITALS — BP 118/82 | HR 69 | Temp 98.6°F | Wt 128.8 lb

## 2015-04-28 DIAGNOSIS — J029 Acute pharyngitis, unspecified: Secondary | ICD-10-CM

## 2015-04-28 DIAGNOSIS — R894 Abnormal immunological findings in specimens from other organs, systems and tissues: Secondary | ICD-10-CM

## 2015-04-28 DIAGNOSIS — F329 Major depressive disorder, single episode, unspecified: Secondary | ICD-10-CM

## 2015-04-28 DIAGNOSIS — R768 Other specified abnormal immunological findings in serum: Secondary | ICD-10-CM

## 2015-04-28 DIAGNOSIS — F32A Depression, unspecified: Secondary | ICD-10-CM

## 2015-04-28 MED ORDER — ACYCLOVIR 200 MG PO CAPS: 200.0000 mg | ORAL_CAPSULE | Freq: Every day | ORAL | Status: DC

## 2015-04-28 MED FILL — ACYCLOVIR 200 MG CAPSULE: 200 | 30 days supply | Qty: 100 | Fill #0

## 2015-04-28 NOTE — Progress Notes (Signed)
Patient ID: Jennifer Barber, female   DOB: Feb 08, 1985, 30 y.o.   MRN: [id number]   Jennifer Barber, is a 30 y.o. female  [id number]  [id number]  DOB - August 03, 1985  Chief Complaint  Patient presents with  . Allergic Rhinitis     ST;cough-dry;fever x1 week        Subjective:  Chief Complaint and HPI: Jennifer Barber is a 30 y.o. female here today to address a few concerns.   1-She has been off of Celexa for several months and feels like she would like to restart it. At the time she stopped taking it, she was at the 40mg  dose.  She feels like she has been less motivated and lower energy since being off of it.  Also, her sleep seemed better when she was on it.  She has a good support system.  She is in cosmetology school.  She denies SI/HI.  Appetite is good.   2-Needs an Rx in the event she has an HSV outbreak.  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  She does not have an outbreak now, nor does she get them frequently.  3-she has noticed little white spots on her tonsils occasionally that taste bad and cause bad breath. She denies sore throat or fever.  She does admit to feeling "run down and tired lately."  ROS:   Constitutional:  No f/c, No night sweats, No unexplained weight loss. EENT:  No vision changes, No blurry vision, No hearing changes. No ear problems.  Respiratory: No cough, No SOB Cardiac: No CP, no palpitations GI:  No abd pain, No N/V/D. GU: No Urinary s/sx Musculoskeletal: No joint pain Neuro: No headache, no dizziness, no motor weakness.  Skin: No rash Endocrine:  No polydipsia. No polyuria.  Psych: Denies SI/HI    ALLERGIES: Allergies  Allergen Reactions  . Bee Venom Swelling  . Benadryl [Diphenhydramine] Swelling  . Codeine Itching  . Latex Rash    PAST MEDICAL HISTORY: Past Medical History  Diagnosis Date  . Abnormal Pap smear   . Anemia   . Scoliosis   . Sickle cell trait ([place])   . Blood dyscrasia     Sickle Cell Trait  . Mental disorder     bipolar,  schizophrenia  . Bipolar 1 disorder ([place])   . Schizophrenia ([place])   . Depression     MEDICATIONS AT HOME: Prior to Admission medications   Medication Sig Start Date End Date Taking? Authorizing Provider  citalopram (CELEXA) 20 MG tablet Take 1 tablet (20 mg total) by mouth daily. 02/16/13  Yes Christin Fudge, CNM  docusate sodium (COLACE) 100 MG capsule Take 1 capsule (100 mg total) by mouth daily. 06/22/13  Yes Roma Schanz, CNM  ibuprofen (ADVIL,MOTRIN) 600 MG tablet Take 1 tablet (600 mg total) by mouth every 6 (six) hours. 08/19/13  Yes York Ram Melancon, MD  sodium chloride (OCEAN) 0.65 % SOLN nasal spray Place 1 spray into both nostrils as needed for congestion.   Yes Historical Provider, MD  acyclovir (ZOVIRAX) 200 MG capsule Take 1 capsule (200 mg total) by mouth 5 (five) times daily. X 5 days prn outbreak 04/28/15   Argentina Donovan, PA-C  ferrous sulfate (FERROUSUL) 325 (65 FE) MG tablet Take 1 tablet (325 mg total) by mouth 3 (three) times daily with meals. 10/01/10 10/01/11  Melony Overly, MD     Objective:  Jasmine DecemberDanley Danker Vitals:   04/28/15 0932  BP: 118/82  Pulse:  69  Temp: 98.6 F (37 C)  TempSrc: Oral  Weight: 128 lb 12.8 oz (58.423 kg)    General appearance : A&OX3. NAD. Non-toxic-appearing HEENT: Atraumatic and Normocephalic.  PERRLA. EOM intact.  TM clear B. Mouth-MMM, post pharynx WNL w/o erythema, No PND.  She does have a tonsillolith in one of of her tonsil crypts that I was able to manipulate out with a swab.  Neck: supple, no JVD. No cervical lymphadenopathy. No thyromegaly Chest/Lungs:  Breathing-non-labored, Good air entry bilaterally, breath sounds normal without rales, rhonchi, or wheezing  CVS: S1 S2 regular, no murmurs, gallops, rubs  Extremities: Bilateral Lower Ext shows no edema, both legs are warm to touch with = pulse throughout Neurology:  CN II-XII grossly intact, Non focal.   Psych:  TP linear. J/I WNL. Normal speech. Appropriate eye  contact and affect.  Skin:  No Rash    Assessment & Plan   1. Depression I will restart Celexa at 20mg .  Consider titration to 40mg  at f/up in 2 weeks.   2. HSV-2 seropositive Acyclovir for outbreaks  3. Acute pharyngitis, unspecified etiology She does not have pharyngitis, but there is no code for tonsillolith.  We discussed oral hygiene which may help reduce the recurrence of these, but overall, these are benign and self-limiting.    Patient have been counseled extensively about nutrition and exercise  Return for keep may 1st appointment.  The patient was given clear instructions to go to ER or return to medical center if symptoms don't improve, worsen or new problems develop. The patient verbalized understanding. The patient was told to call to get lab results if they haven't heard anything in the next week.     Freeman Caldron, PA-C [facility] and [address] [place], [place]   04/28/2015, 11:49 AM

## 2015-04-28 NOTE — Patient Instructions (Signed)
[place]

## 2015-05-08 ENCOUNTER — Encounter: Payer: Self-pay | Admitting: Internal Medicine

## 2015-05-08 ENCOUNTER — Other Ambulatory Visit (HOSPITAL_COMMUNITY)
Admission: RE | Admit: 2015-05-08 | Discharge: 2015-05-08 | Disposition: A | Discharge status: Home / Self Care | Payer: Medicaid Other | Source: Ambulatory Visit | Attending: Internal Medicine | Admitting: Internal Medicine

## 2015-05-08 ENCOUNTER — Ambulatory Visit: Payer: Medicaid Other | Attending: Internal Medicine | Admitting: Internal Medicine

## 2015-05-08 VITALS — BP 111/72 | HR 79 | Temp 98.7°F | Resp 18 | Ht 60.0 in | Wt 127.4 lb

## 2015-05-08 DIAGNOSIS — Z87891 Personal history of nicotine dependence: Secondary | ICD-10-CM | POA: Diagnosis not present

## 2015-05-08 DIAGNOSIS — Z01419 Encounter for gynecological examination (general) (routine) without abnormal findings: Secondary | ICD-10-CM | POA: Diagnosis present

## 2015-05-08 DIAGNOSIS — F418 Other specified anxiety disorders: Secondary | ICD-10-CM | POA: Insufficient documentation

## 2015-05-08 DIAGNOSIS — Z124 Encounter for screening for malignant neoplasm of cervix: Secondary | ICD-10-CM | POA: Insufficient documentation

## 2015-05-08 DIAGNOSIS — Z0001 Encounter for general adult medical examination with abnormal findings: Secondary | ICD-10-CM | POA: Diagnosis not present

## 2015-05-08 DIAGNOSIS — N760 Acute vaginitis: Secondary | ICD-10-CM | POA: Diagnosis present

## 2015-05-08 DIAGNOSIS — G43009 Migraine without aura, not intractable, without status migrainosus: Secondary | ICD-10-CM | POA: Diagnosis not present

## 2015-05-08 DIAGNOSIS — F411 Generalized anxiety disorder: Secondary | ICD-10-CM | POA: Diagnosis not present

## 2015-05-08 DIAGNOSIS — F329 Major depressive disorder, single episode, unspecified: Secondary | ICD-10-CM

## 2015-05-08 DIAGNOSIS — Z1329 Encounter for screening for other suspected endocrine disorder: Secondary | ICD-10-CM

## 2015-05-08 DIAGNOSIS — M419 Scoliosis, unspecified: Secondary | ICD-10-CM | POA: Insufficient documentation

## 2015-05-08 DIAGNOSIS — F32A Depression, unspecified: Secondary | ICD-10-CM

## 2015-05-08 DIAGNOSIS — D573 Sickle-cell trait: Secondary | ICD-10-CM | POA: Diagnosis not present

## 2015-05-08 DIAGNOSIS — Z79899 Other long term (current) drug therapy: Secondary | ICD-10-CM | POA: Insufficient documentation

## 2015-05-08 DIAGNOSIS — G4700 Insomnia, unspecified: Secondary | ICD-10-CM

## 2015-05-08 DIAGNOSIS — Z113 Encounter for screening for infections with a predominantly sexual mode of transmission: Secondary | ICD-10-CM | POA: Insufficient documentation

## 2015-05-08 DIAGNOSIS — E079 Disorder of thyroid, unspecified: Secondary | ICD-10-CM | POA: Diagnosis not present

## 2015-05-08 DIAGNOSIS — Z1151 Encounter for screening for human papillomavirus (HPV): Secondary | ICD-10-CM | POA: Insufficient documentation

## 2015-05-08 DIAGNOSIS — F209 Schizophrenia, unspecified: Secondary | ICD-10-CM | POA: Diagnosis not present

## 2015-05-08 LAB — BASIC METABOLIC PANEL WITH GFR
BUN: 9 mg/dL (ref 7–25)
CO2: 24 mmol/L (ref 20–31)
Calcium: 9.5 mg/dL (ref 8.6–10.2)
Chloride: 107 mmol/L (ref 98–110)
Creat: 0.8 mg/dL (ref 0.50–1.10)
GFR, Est African American: >89 mL/min (ref >=60)
GFR, Est Non African American: >89 mL/min (ref >=60)
Glucose, Bld: 91 mg/dL (ref 65–99)
Potassium: 4.4 mmol/L (ref 3.5–5.3)
Sodium: 142 mmol/L (ref 135–146)

## 2015-05-08 LAB — CBC WITH DIFFERENTIAL/PLATELET
Basophils Absolute: 66 cells/uL (ref 0–200)
Basophils Relative: 1 %
Eosinophils Absolute: 66 cells/uL (ref 15–500)
Eosinophils Relative: 1 %
HCT: 41.6 % (ref 35.0–45.0)
Hemoglobin: 13.5 g/dL (ref 11.7–15.5)
Lymphocytes Relative: 41 %
Lymphs Abs: 2706 cells/uL (ref 850–3900)
MCH: 27.3 pg (ref 27.0–33.0)
MCHC: 32.5 g/dL (ref 32.0–36.0)
MCV: 84 fL (ref 80.0–100.0)
MPV: 10 fL (ref 7.5–12.5)
Monocytes Absolute: 330 cells/uL (ref 200–950)
Monocytes Relative: 5 %
Neutro Abs: 3432 cells/uL (ref 1500–7800)
Neutrophils Relative %: 52 %
Platelets: 225 10*3/uL (ref 140–400)
RBC: 4.95 MIL/uL (ref 3.80–5.10)
RDW: 13.1 % (ref 11.0–15.0)
WBC: 6.6 10*3/uL (ref 3.8–10.8)

## 2015-05-08 LAB — LIPID PANEL
Cholesterol: 139 mg/dL (ref 125–200)
HDL: 46 mg/dL (ref >=46)
LDL Cholesterol: 86 mg/dL (ref <130)
Total CHOL/HDL Ratio: 3 Ratio (ref <=5.0)
Triglycerides: 37 mg/dL (ref <150)
VLDL: 7 mg/dL (ref <30)

## 2015-05-08 LAB — TSH: TSH: 1.07 mIU/L

## 2015-05-08 LAB — HIV ANTIBODY (ROUTINE TESTING W REFLEX): HIV 1&2 Ab, 4th Generation: NONREACTIVE

## 2015-05-08 MED ORDER — FLUCONAZOLE 150 MG PO TABS: 150.0000 mg | ORAL_TABLET | Freq: Every day | ORAL | Status: DC

## 2015-05-08 MED ORDER — AMITRIPTYLINE HCL 25 MG PO TABS: 25.0000 mg | ORAL_TABLET | Freq: Every day | ORAL | Status: DC

## 2015-05-08 MED FILL — AMITRIPTYLINE HCL 25 MG TAB: 25 | 30 days supply | Qty: 30 | Fill #0

## 2015-05-08 MED FILL — FLUCONAZOLE 150 MG TABLET: 150 | 3 days supply | Qty: 3 | Fill #0

## 2015-05-08 NOTE — Patient Instructions (Signed)

## 2015-05-08 NOTE — Progress Notes (Signed)
Patient is here to Establish Care  Patient complains of HA being present for the past two days. Pain is described as bilateral throbbing in the temporal region. Pain is scaled at a 6 currently.  Patient has not taken any medication nor has she eaten.  Patient would like a FU phone call for Depression screening.

## 2015-05-08 NOTE — Progress Notes (Signed)
Jennifer Barber, is a 30 y.o. female  [id number]  [id number]  DOB - 07-06-1985  CC:  Chief Complaint  Patient presents with  . Establish Care       HPI: Jennifer Barber is a 30 y.o. female here today to establish medical care. Last seen in clinic 03/28/15 by PA.  She states she was not able to pick up her Celexa last time, only able to pick up the acyclovir.  She denies recent outbreaks.  Currently not sexually active.  She has numerous somatic c/o including difficulty concentrating, unable to sleep at night, no energy.  Describes being anxious while driving sometimes and feeling very depressed.  She denies si/hi/ah/vh.  She use to see psychologist but does not recall what clinic.  She states her appetite has been very off lately, with no urge or pleasure in eating.  She also c/o of temporal HA, constant ache in nature, with no appetite, some minor photophobia, but denies auras/na/v.  She also requested an std/hiv testing as well.  Has Nexplanon implant, 9/15 placement. Uses deuches sometimes as well.  Patient has No chest pain, No abdominal pain - No Nausea, No new weakness tingling or numbness, No Cough - SOB.  Allergies  Allergen Reactions  . Bee Venom Swelling  . Benadryl [Diphenhydramine] Swelling  . Codeine Itching  . Latex Rash   Past Medical History  Diagnosis Date  . Abnormal Pap smear   . Anemia   . Scoliosis   . Sickle cell trait ([place])   . Blood dyscrasia     Sickle Cell Trait  . Mental disorder     bipolar, schizophrenia  . Bipolar 1 disorder ([place])   . Schizophrenia ([place])   . Depression    Current Outpatient Prescriptions on File Prior to Visit  Medication Sig Dispense Refill  . acyclovir (ZOVIRAX) 200 MG capsule Take 1 capsule (200 mg total) by mouth 5 (five) times daily. X 5 days prn outbreak 100 capsule 1  . ibuprofen (ADVIL,MOTRIN) 600 MG tablet Take 1 tablet (600 mg total) by mouth every 6 (six) hours. 30 tablet 0  . sodium chloride (OCEAN) 0.65 % SOLN  nasal spray Place 1 spray into both nostrils as needed for congestion.    . docusate sodium (COLACE) 100 MG capsule Take 1 capsule (100 mg total) by mouth daily. (Patient not taking: Reported on 05/08/2015) 30 capsule 3  . ferrous sulfate (FERROUSUL) 325 (65 FE) MG tablet Take 1 tablet (325 mg total) by mouth 3 (three) times daily with meals. 30 tablet 3   No current facility-administered medications on file prior to visit.   Family History  Problem Relation Age of Onset  . Cancer Mother   . Kidney disease Mother   . Alcohol abuse Mother   . Mental illness Mother   . Cancer Maternal Grandmother    Social History   Social History  . Marital Status: Single    Spouse Name: N/A  . Number of Children: N/A  . Years of Education: N/A   Occupational History  . Not on file.   Social History Main Topics  . Smoking status: Former Smoker -- 0.25 packs/day for 11 years    Types: E-cigarettes  . Smokeless tobacco: Never Used  . Alcohol Use: No     Comment: pt denies smoking, drinking or drug use  . Drug Use: No     Comment: Pt had + UDS during pregnancy; Pt denied during admission history   . Sexual Activity:  Not Currently    Birth Control/ Protection: None   Other Topics Concern  . Not on file   Social History Narrative    Review of Systems: Constitutional: Negative for fever, chills, diaphoresis, activity change, + decrease appetite and increase fatigue.  Some hair loss.   HENT: Negative for ear pain, nosebleeds, congestion, facial swelling, rhinorrhea, neck pain, neck stiffness and ear discharge.  Eyes: Negative for pain, discharge, redness, itching and visual disturbance. Respiratory: Negative for cough, choking, chest tightness, shortness of breath, wheezing and stridor.  Cardiovascular: Negative for chest pain, palpitations and leg swelling. Gastrointestinal: Negative for abdominal distention. Genitourinary: Negative for dysuria, urgency, frequency, hematuria, flank pain,  decreased urine volume, difficulty urinating and dyspareunia.  Denies vaginal discharge, burning. Musculoskeletal: Negative for back pain, joint swelling, arthralgia and gait problem. Neurological: Negative for dizziness, tremors, seizures, syncope, facial asymmetry, speech difficulty, weakness, light-headedness, numbness and headaches.  Hematological: Negative for adenopathy. Does not bruise/bleed easily. Psychiatric/Behavioral: Negative for hallucinations, behavioral problems, confusion; + dysphoric mood, + decreased concentration and anxious at times.  Denies si/hi/ah/vh.  Use to take amitryptyline for migraines and depression which appeared to help, has also been on Celexa in past as well.  Insomnia, can't sleep at night,   Objective:   Filed Vitals:   05/08/15 1112  BP: 111/72  Pulse: 79  Temp: 98.7 F (37.1 C)  Resp: 18    Physical Exam: Constitutional: Patient appears well-developed and well-nourished. No distress. AAOx3 HENT: Normocephalic, atraumatic, External right and left ear normal. Oropharynx is clear and moist.  Eyes: Conjunctivae and EOM are normal. PERRL, no scleral icterus. Neck: Normal ROM. Neck supple. No JVD. Marland Kitchen No thyromegaly. CVS: RRR, S1/S2 +, no murmurs, no gallops, no carotid bruit.  Pulmonary: Effort and breath sounds normal, no stridor, rhonchi, wheezes, rales.  Abdominal: Soft. BS +, no distension, tenderness, rebound or guarding.  Musculoskeletal: Normal range of motion. No edema and no tenderness.  LE: bilat/ no c/c/e,  Pelvic Exam: Cervix normal in appearance, posteriorly rotated downwards, easy bleeding w/ minor procedure, external genitalia normal, no adnexal masses or tenderness, no cervical motion tenderness, rectovaginal septum normal, uterus normal size, shape, and consistency and vagina with white, milky, thick discharge, no odor detected.    Lymphadenopathy: No lymphadenopathy noted, cervical Neuro: Alert.  muscle tone coordination. No cranial  nerve deficit grossly. Skin: Skin is warm and dry. No rash noted. Not diaphoretic. No erythema. No pallor. Psychiatric: Normal mood and affect. Behavior, judgment, thought content normal.   CNA present during pap-smear.  Lab Results  Component Value Date   WBC 16.0* 08/17/2013   HGB 11.3* 08/17/2013   HCT 33.4* 08/17/2013   MCV 80.1 08/17/2013   PLT 187 08/17/2013   Lab Results  Component Value Date   CREATININE 0.95 08/28/2011   BUN 8 08/28/2011   NA 138 08/28/2011   K 3.9 08/28/2011   CL 105 08/28/2011   CO2 25 08/28/2011    No results found for: HGBA1C Lipid Panel  No results found for: CHOL, TRIG, HDL, CHOLHDL, VLDL, LDLCALC     Depression screen [facility] 2/9 05/08/2015 04/28/2015  Decreased Interest 3 3  Down, Depressed, Hopeless 3 0  PHQ - 2 Score 6 3  Altered sleeping 3 2  Tired, decreased energy 2 2  Change in appetite 3 0  Feeling bad or failure about yourself  3 1  Trouble concentrating 3 2  Moving slowly or fidgety/restless 0 1  Suicidal thoughts 0 0  PHQ-9 Score  20 11  Difficult doing work/chores - Somewhat difficult    Assessment and plan:   1. Vaginitis and vulvovaginitis -  likely candida, but cannot r/o active infection.  - Cytology - PAP./G&C, wet prep. - HIV antibody (with reflex) - Lipid Panel - CBC with Differential  - r/o anemia as cause of decrease energy. - BASIC METABOLIC PANEL WITH GFR - r/o metabolic causes. - Vitamin D, 25-hydroxy  2. Migraine without aura and without status migrainosus, not intractable  Vs tension ha. - trial amitriptyline for migraine/depression - will f/u w/ her in 2 wks to monitor symptoms - no si/hi/ah/vh   3. Depression w/ anxiety, nos - never picked up Celexa, scored high on PHQ9 - she denies si/hi/ah/vh, close monitoring. - after discussion w/ pt, will try Amitriptyline instead since had good affect w/ it. - Behavorial health c/s  4. Anxiety state See above  5. Insomnia - may be due to  depression,  6. Thyroid disorder screening - r/o medical cause of decrease appetite, etc. - TSH  7. Cervical cancer screening - Cytology - PAP Kyung Rudd stds/ - emp treat w/ diflucan x 3 days for yeast infection - counseled pt against deuching.   8. Birth control - has Nexplanon on left upper extremity, placed by OBgyn 9/15  Return in about 2 weeks (around 05/22/2015).   The patient was given clear instructions to go to ER or return to medical center if symptoms don't improve, worsen or new problems develop. The patient verbalized understanding. The patient was told to call to get lab results if they haven't heard anything in the next week.      Maren Reamer, MD, [facility], [place]   05/08/2015, 11:29 AM

## 2015-05-09 LAB — VITAMIN D 25 HYDROXY (VIT D DEFICIENCY, FRACTURES): Vit D, 25-Hydroxy: 24 ng/mL — ABNORMAL LOW (ref 30–100)

## 2015-05-09 LAB — CERVICOVAGINAL ANCILLARY ONLY: Wet Prep (BD Affirm): NEGATIVE

## 2015-05-10 LAB — CYTOLOGY - PAP

## 2015-05-13 LAB — CERVICOVAGINAL ANCILLARY ONLY: Herpes: NEGATIVE

## 2015-05-22 ENCOUNTER — Ambulatory Visit: Payer: Medicaid Other | Admitting: Internal Medicine

## 2015-06-01 ENCOUNTER — Telehealth: Payer: Self-pay | Admitting: Internal Medicine

## 2015-06-01 NOTE — Telephone Encounter (Signed)
Clld pt - advsd of lab results. Pt stated she understood. 

## 2015-06-01 NOTE — Telephone Encounter (Signed)
Patient needs lab results. Please follow up.

## 2015-06-02 ENCOUNTER — Ambulatory Visit: Payer: Medicaid Other | Attending: Physician Assistant | Admitting: Physician Assistant

## 2015-06-02 ENCOUNTER — Encounter (INDEPENDENT_AMBULATORY_CARE_PROVIDER_SITE_OTHER): Payer: Self-pay

## 2015-06-02 ENCOUNTER — Encounter: Payer: Self-pay | Admitting: *Deleted

## 2015-06-02 ENCOUNTER — Encounter: Payer: Self-pay | Admitting: Physician Assistant

## 2015-06-02 VITALS — BP 109/72 | HR 72 | Temp 98.4°F | Ht 60.0 in | Wt 127.6 lb

## 2015-06-02 DIAGNOSIS — Z79899 Other long term (current) drug therapy: Secondary | ICD-10-CM | POA: Diagnosis not present

## 2015-06-02 DIAGNOSIS — M419 Scoliosis, unspecified: Secondary | ICD-10-CM | POA: Insufficient documentation

## 2015-06-02 DIAGNOSIS — F319 Bipolar disorder, unspecified: Secondary | ICD-10-CM | POA: Diagnosis not present

## 2015-06-02 DIAGNOSIS — J020 Streptococcal pharyngitis: Secondary | ICD-10-CM | POA: Diagnosis not present

## 2015-06-02 DIAGNOSIS — F209 Schizophrenia, unspecified: Secondary | ICD-10-CM | POA: Diagnosis not present

## 2015-06-02 DIAGNOSIS — D573 Sickle-cell trait: Secondary | ICD-10-CM | POA: Diagnosis not present

## 2015-06-02 DIAGNOSIS — J029 Acute pharyngitis, unspecified: Secondary | ICD-10-CM

## 2015-06-02 LAB — POCT RAPID STREP A (OFFICE): Rapid Strep A Screen: POSITIVE — AB

## 2015-06-02 MED ORDER — FLUCONAZOLE 150 MG PO TABS: 150.0000 mg | ORAL_TABLET | Freq: Every day | ORAL | Status: DC

## 2015-06-02 MED ORDER — PENICILLIN V POTASSIUM 500 MG PO TABS: 500.0000 mg | ORAL_TABLET | Freq: Three times a day (TID) | ORAL | Status: DC

## 2015-06-02 NOTE — Progress Notes (Signed)
Throat sore x1 wk, notice white spots on the back of her throat, took ibuprofen, headaches, right ear pain, chills, unknown if she has fever, no appetite, fatigue, swollen glands

## 2015-06-02 NOTE — Progress Notes (Signed)
Patient ID: Jennifer Barber, female   DOB: [birth year], 30 y.o.   MRN: [id number]   Jennifer Barber, is a 30 y.o. female  [id number]  [id number]  DOB - March 27, 1985  No chief complaint on file.       Subjective:  Chief Complaint and HPI: Jennifer Barber is a 30 y.o. female here today for 1 week h/o ST.  Her 2 yr old son was sick about 1.5 weeks ago and wouldn't eat, had fever, and vomited once then got better.  She started feeling achey and tired with sore throat a few days after that.  No cough.  She has a h/o tonsilliths, so she has been rigorously attempting to keep exudate off her throat.  She is unsure if she has had fever, but she has had chills.  + headache.  Ibuprofen helps some.      ROS:   Constitutional:  No fever, +chills, No night sweats, No unexplained weight loss. + decreased appetite EENT:  No vision changes, No blurry vision, No hearing changes. +sore throat  Respiratory: No cough, No SOB Cardiac: No CP, no palpitations GI:  No abd pain, No N/V/D. GU: No Urinary s/sx Musculoskeletal: No joint pain Neuro: No headache, no dizziness, no motor weakness.  Skin: No rash Endocrine:  No polydipsia. No polyuria.  Psych: Denies SI/HI  No problems updated.  ALLERGIES: Allergies  Allergen Reactions  . Bee Venom Swelling  . Benadryl [Diphenhydramine] Swelling  . Codeine Itching  . Latex Rash    PAST MEDICAL HISTORY: Past Medical History  Diagnosis Date  . Abnormal Pap smear   . Anemia   . Scoliosis   . Sickle cell trait ([place])   . Blood dyscrasia     Sickle Cell Trait  . Mental disorder     bipolar, schizophrenia  . Bipolar 1 disorder ([place])   . Schizophrenia ([place])   . Depression     MEDICATIONS AT HOME: Prior to Admission medications   Medication Sig Start Date End Date Taking? Authorizing Provider  amitriptyline (ELAVIL) 25 MG tablet Take 1 tablet (25 mg total) by mouth at bedtime. 05/08/15  Yes Tresa Garter, MD  ibuprofen (ADVIL,MOTRIN) 600 MG tablet  Take 1 tablet (600 mg total) by mouth every 6 (six) hours. 08/19/13  Yes York Ram Melancon, MD  sodium chloride (OCEAN) 0.65 % SOLN nasal spray Place 1 spray into both nostrils as needed for congestion. Reported on 06/02/2015   Yes Historical Provider, MD  acyclovir (ZOVIRAX) 200 MG capsule Take 1 capsule (200 mg total) by mouth 5 (five) times daily. X 5 days prn outbreak Patient not taking: Reported on 06/02/2015 04/28/15   Argentina Donovan, PA-C  docusate sodium (COLACE) 100 MG capsule Take 1 capsule (100 mg total) by mouth daily. Patient not taking: Reported on 05/08/2015 06/22/13   Roma Schanz, CNM  ferrous sulfate (FERROUSUL) 325 (65 FE) MG tablet Take 1 tablet (325 mg total) by mouth 3 (three) times daily with meals. 10/01/10 10/01/11  Melony Overly, MD  fluconazole (DIFLUCAN) 150 MG tablet Take 1 tablet (150 mg total) by mouth daily. Patient not taking: Reported on 06/02/2015 05/08/15   Tresa Garter, MD  fluconazole (DIFLUCAN) 150 MG tablet Take 1 tablet (150 mg total) by mouth daily. 06/02/15   Argentina Donovan, PA-C  penicillin v potassium (VEETID) 500 MG tablet Take 1 tablet (500 mg total) by mouth 3 (three) times daily. 06/02/15   Argentina Donovan, PA-C  Objective:  EXAM:   Filed Vitals:   06/02/15 1215  BP: 109/72  Pulse: 72  Temp: 98.4 F (36.9 C)  TempSrc: Oral  Height: 5' (1.524 m)  Weight: 127 lb 9.6 oz (57.879 kg)  SpO2: 96%    General appearance : A&OX3. NAD. Non-toxic-appearing HEENT: Atraumatic and Normocephalic.  PERRLA. EOM intact.  TM clear B. Mouth-MMM, post pharynx with swollen and erythematous tonsils B.  Uvula midline.  There is whitish exudate R tonsil more so than L.  Neck: supple, no JVD. +shotty ant cervical lymphadenopathy. No thyromegaly Chest/Lungs:  Breathing-non-labored, Good air entry bilaterally, breath sounds normal without rales, rhonchi, or wheezing  CVS: S1 S2 regular, no murmurs, gallops, rubs  Abdomen: Bowel sounds present, Non tender and  not distended with no gaurding, rigidity or rebound. Extremities: Bilateral Lower Ext shows no edema, both legs are warm to touch with = pulse throughout Neurology:  CN II-XII grossly intact, Non focal.   Psych:  TP linear. J/I WNL. Normal speech. Appropriate eye contact and affect.  Skin:  No Rash   Assessment & Plan   1. Streptococcal sore throat PCN 500mg  tidX 10days Salt water gargles tid, fluids, rest, ibuprofen.  Also advised she call the pediatrician as this is likely what her 59 yr old son had when he was sick with fever a few days before she started feeling bad.    2. Sore throat - Rapid Strep A =+ Patient have been counseled extensively about nutrition and exercise  F/up prn  The patient was given clear instructions to go to ER or return to medical center if symptoms don't improve, worsen or new problems develop. The patient verbalized understanding. The patient was told to call to get lab results if they haven't heard anything in the next week.     Freeman Caldron, PA-C [facility] and [facility] [place], [place]   06/02/2015, 2:40 PM

## 2015-06-02 NOTE — Patient Instructions (Signed)

## 2015-06-13 NOTE — Progress Notes (Signed)
Patient is aware of results per Tommas Olp CMA

## 2015-06-13 NOTE — Telephone Encounter (Signed)
-----   Message from Maren Reamer, MD sent at 05/11/2015 12:38 PM EDT ----- Please call pt w/ results. Vit D borderline low, which may lead to bone/muscle pains. Tell her to take OTC Vit D 100mg  daily which should help.  hiv neg, cholesterol levels look great, kkdneys normal, thyroid normal, blood count better than 1 year ago, no more anemia, papsmear negative for malignancy as well - will not need another pap for 3 years.  Neg STDs. Thanks!

## 2015-06-13 NOTE — Telephone Encounter (Signed)
Patient is aware of lab results.

## 2016-03-01 MED FILL — ACYCLOVIR 200 MG CAPSULE: 200 | 20 days supply | Qty: 100 | Fill #1

## 2016-03-12 ENCOUNTER — Other Ambulatory Visit: Payer: Self-pay | Admitting: Internal Medicine

## 2016-03-12 ENCOUNTER — Ambulatory Visit: Payer: Medicaid Other | Attending: Internal Medicine | Admitting: Internal Medicine

## 2016-03-12 ENCOUNTER — Encounter: Payer: Self-pay | Admitting: Internal Medicine

## 2016-03-12 VITALS — BP 122/78 | HR 79 | Temp 98.1°F | Resp 16 | Wt 135.2 lb

## 2016-03-12 DIAGNOSIS — Z888 Allergy status to other drugs, medicaments and biological substances status: Secondary | ICD-10-CM | POA: Diagnosis not present

## 2016-03-12 DIAGNOSIS — G43909 Migraine, unspecified, not intractable, without status migrainosus: Secondary | ICD-10-CM | POA: Diagnosis not present

## 2016-03-12 DIAGNOSIS — Z885 Allergy status to narcotic agent status: Secondary | ICD-10-CM | POA: Insufficient documentation

## 2016-03-12 DIAGNOSIS — F209 Schizophrenia, unspecified: Secondary | ICD-10-CM | POA: Diagnosis not present

## 2016-03-12 DIAGNOSIS — D170 Benign lipomatous neoplasm of skin and subcutaneous tissue of head, face and neck: Secondary | ICD-10-CM | POA: Diagnosis not present

## 2016-03-12 DIAGNOSIS — D573 Sickle-cell trait: Secondary | ICD-10-CM | POA: Diagnosis not present

## 2016-03-12 DIAGNOSIS — R51 Headache: Secondary | ICD-10-CM | POA: Diagnosis present

## 2016-03-12 DIAGNOSIS — F319 Bipolar disorder, unspecified: Secondary | ICD-10-CM | POA: Diagnosis not present

## 2016-03-12 DIAGNOSIS — Z91040 Latex allergy status: Secondary | ICD-10-CM | POA: Insufficient documentation

## 2016-03-12 DIAGNOSIS — F329 Major depressive disorder, single episode, unspecified: Secondary | ICD-10-CM | POA: Diagnosis not present

## 2016-03-12 DIAGNOSIS — Z131 Encounter for screening for diabetes mellitus: Secondary | ICD-10-CM

## 2016-03-12 DIAGNOSIS — E559 Vitamin D deficiency, unspecified: Secondary | ICD-10-CM | POA: Diagnosis not present

## 2016-03-12 DIAGNOSIS — Z0000 Encounter for general adult medical examination without abnormal findings: Secondary | ICD-10-CM

## 2016-03-12 DIAGNOSIS — G43809 Other migraine, not intractable, without status migrainosus: Secondary | ICD-10-CM

## 2016-03-12 DIAGNOSIS — R220 Localized swelling, mass and lump, head: Secondary | ICD-10-CM | POA: Diagnosis not present

## 2016-03-12 DIAGNOSIS — F32A Depression, unspecified: Secondary | ICD-10-CM

## 2016-03-12 LAB — CBC
HCT: 41.3 % (ref 35.0–45.0)
Hemoglobin: 13.3 g/dL (ref 11.7–15.5)
MCH: 27.4 pg (ref 27.0–33.0)
MCHC: 32.2 g/dL (ref 32.0–36.0)
MCV: 85 fL (ref 80.0–100.0)
MPV: 9.9 fL (ref 7.5–12.5)
Platelets: 277 10*3/uL (ref 140–400)
RBC: 4.86 MIL/uL (ref 3.80–5.10)
RDW: 13.4 % (ref 11.0–15.0)
WBC: 7.1 10*3/uL (ref 3.8–10.8)

## 2016-03-12 LAB — CMP AND LIVER
ALT: 11 U/L (ref 6–29)
AST: 15 U/L (ref 10–30)
Albumin: 4.2 g/dL (ref 3.6–5.1)
Alkaline Phosphatase: 36 U/L (ref 33–115)
BUN: 11 mg/dL (ref 7–25)
Bilirubin, Direct: 0.1 mg/dL (ref <=0.2)
CO2: 22 mmol/L (ref 20–31)
Calcium: 9.4 mg/dL (ref 8.6–10.2)
Chloride: 106 mmol/L (ref 98–110)
Creat: 0.98 mg/dL (ref 0.50–1.10)
Glucose, Bld: 77 mg/dL (ref 65–99)
Indirect Bilirubin: 0.2 mg/dL (ref 0.2–1.2)
Potassium: 3.8 mmol/L (ref 3.5–5.3)
Sodium: 139 mmol/L (ref 135–146)
Total Bilirubin: 0.3 mg/dL (ref 0.2–1.2)
Total Protein: 7 g/dL (ref 6.1–8.1)

## 2016-03-12 LAB — POCT GLYCOSYLATED HEMOGLOBIN (HGB A1C): Hemoglobin A1C: 5

## 2016-03-12 LAB — POCT URINE PREGNANCY: Preg Test, Ur: NEGATIVE

## 2016-03-12 MED ORDER — AMITRIPTYLINE HCL 25 MG PO TABS: 12.5000 mg | ORAL_TABLET | Freq: Every evening | ORAL | 1 refills | Status: DC | PRN

## 2016-03-12 NOTE — Progress Notes (Signed)
Jennifer Barber, is a 31 y.o. female  [id number]  [id number]  DOB - [birth year]  Chief Complaint  Patient presents with  . Headache  . Cyst        Subjective:   Jennifer Barber is a 31 y.o. female here today for a follow up visit for headaches/migraines.  Per pt, elavil caused am drousyness for her in the past.  Says it use to be flat, present for years, but now appears "slightly larger and lumpier".  Also co of headaches more frequently, currently has ha and +photophobia. Took motrin last night which helped a little bit.  Of note, about 1 month ago, had more severe headache, +photophobia, possible aura, and slight ataxia, denied n/v at time, and not relieved for about 3 days.  Does not smoke, but occasionally drinks a glass of wine.  No ivdu. 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  As Norplant on lue, placed 2015.  Still w/ menstrual bleeding, sometimes last 1-2 wks.   Patient has No headache, No chest pain, No abdominal pain - No Nausea, No new weakness tingling or numbness, No Cough - SOB.  No problems updated.  ALLERGIES: Allergies  Allergen Reactions  . Bee Venom Swelling  . Benadryl [Diphenhydramine] Swelling  . Codeine Itching  . Latex Rash    PAST MEDICAL HISTORY: Past Medical History:  Diagnosis Date  . Abnormal Pap smear   . Anemia   . Bipolar 1 disorder ([place])   . Blood dyscrasia    Sickle Cell Trait  . Depression   . Mental disorder    bipolar, schizophrenia  . Schizophrenia ([place])   . Scoliosis   . Sickle cell trait ([place])     MEDICATIONS AT HOME: Prior to Admission medications   Medication Sig Start Date End Date Taking? Authorizing Provider  acyclovir (ZOVIRAX) 200 MG capsule Take 1 capsule (200 mg total) by mouth 5 (five) times daily. X 5 days prn outbreak Patient not taking: Reported on 06/02/2015 04/28/15   Argentina Donovan, PA-C  amitriptyline (ELAVIL) 25 MG tablet Take 0.5 tablets (12.5 mg total) by  mouth at bedtime as needed for sleep. 03/12/16   Maren Reamer, MD  docusate sodium (COLACE) 100 MG capsule Take 1 capsule (100 mg total) by mouth daily. Patient not taking: Reported on 05/08/2015 06/22/13   Roma Schanz, CNM  ferrous sulfate (FERROUSUL) 325 (65 FE) MG tablet Take 1 tablet (325 mg total) by mouth 3 (three) times daily with meals. 10/01/10 10/01/11  Melony Overly, MD  fluconazole (DIFLUCAN) 150 MG tablet Take 1 tablet (150 mg total) by mouth daily. Patient not taking: Reported on 06/02/2015 05/08/15   Tresa Garter, MD  fluconazole (DIFLUCAN) 150 MG tablet Take 1 tablet (150 mg total) by mouth daily. Patient not taking: Reported on 03/12/2016 06/02/15   Argentina Donovan, PA-C  ibuprofen (ADVIL,MOTRIN) 600 MG tablet Take 1 tablet (600 mg total) by mouth every 6 (six) hours. 08/19/13   York Ram Melancon, MD  sodium chloride (OCEAN) 0.65 % SOLN nasal spray Place 1 spray into both nostrils as needed for congestion. Reported on 06/02/2015    Historical Provider, MD     Objective:   Vitals:   03/12/16 0942  BP: 122/78  Pulse: 79  Resp: 16  Temp: 98.1 F (36.7 C)  TempSrc: Oral  SpO2: 100%  Weight: 135 lb 3.2 oz (61.3 kg)    Exam General appearance : Awake, alert, not in  any distress. Speech Clear. Not toxic looking, pleasant. Has braids in hair.   HEENT: Atraumatic and Normocephalic, pupils equally reactive to light.  No nystagmus, but difficulty w/ eomi exam due to photophobia.  Small left scalp mass, near hairline, about 1inch dm, likely lipoma. Neck: supple, no JVD. No cervical lymphadenopathy.  Chest:Good air entry bilaterally, no added sounds. CVS: S1 S2 regular, no murmurs/gallups or rubs. Abdomen: Bowel sounds active, Non tender and not distended with no gaurding, rigidity or rebound. Extremities: B/L Lower Ext shows no edema, both legs are warm to touch Neurology: Awake alert, and oriented X 3, CN II-XII grossly intact, Non focal Skin:No Rash  Data Review No  results found for: HGBA1C  Depression screen [facility] 2/9 03/12/2016 06/02/2015 05/08/2015 04/28/2015  Decreased Interest 0 0 3 3  Down, Depressed, Hopeless 0 1 3 0  PHQ - 2 Score 0 1 6 3   Altered sleeping - 2 3 2   Tired, decreased energy - 2 2 2   Change in appetite - 2 3 0  Feeling bad or failure about yourself  - 0 3 1  Trouble concentrating - 2 3 2   Moving slowly or fidgety/restless - 0 0 1  Suicidal thoughts - 0 0 0  PHQ-9 Score - 9 20 11   Difficult doing work/chores - - - Somewhat difficult      Assessment & Plan   1. Vitamin D deficiency - recd otc vit d 800iu daily. - CMP and Liver - CBC - VITAMIN D 25 Hydroxy (Vit-D Deficiency, Fractures)  2. Scalp mass, suspect lipoma - given location and her c/o of headaches/migraines, will get ct head to r/o av malformations, etc. - CT Head W Contrast; Future - POCT urine pregnancy  3. Bipolar 1 disorder ([place]) Pt currently not taking any meds, feels well controlled.  4. Depression, unspecified depression type Says her depression is much better, currently not on any rx, denies si/hi/avh.  5. Other migraine without status migrainosus, not intractable - trial elavil again, at 12.5mg  to see if can help migraine, and avoid sedation in am. - trial exedrin migraine otc. - recd keeping ha diary - recd loosening the hair braids as well. - Ambulatory referral to Neurology  6. H/o Schizophrenia, unspecified type ([place]), per records Denies avh currently, not currently seeing behavorial health.  7. Healthcare maintenance Recd f/u w/ ob who placed norplant, coming up to 3 years.  May be able to keep it in for 4 years per new recds, but defer to ob.  8. Diabetes mellitus screening - POCT glucose (manual entry)  5.0  9. Lipoma of head Seen #2     Patient have been counseled extensively about nutrition and exercise  Return in about 6 months (around 09/12/2016), or if symptoms worsen or fail to improve.  The patient was given clear  instructions to go to ER or return to medical center if symptoms don't improve, worsen or new problems develop. The patient verbalized understanding. The patient was told to call to get lab results if they haven't heard anything in the next week.   This note has been created with Surveyor, quantity. Any transcriptional errors are unintentional.   Maren Reamer, MD, [facility] [place], [place]   03/12/2016, 10:04 AM

## 2016-03-12 NOTE — Patient Instructions (Signed)
Vitamin D 800 IU daily - over the counter for vit d deficiency - loosen the hair may help with headaches - keep headache diary, bring with you to Neurology appt.  Lipoma A lipoma is a noncancerous (benign) tumor that is made up of fat cells. This is a very common type of soft-tissue growth. Lipomas are usually found under the skin (subcutaneous). They may occur in any tissue of the body that contains fat. Common areas for lipomas to appear include the back, shoulders, buttocks, and thighs. Lipomas grow slowly, and they are usually painless. Most lipomas do not cause problems and do not require treatment. What are the causes? The cause of this condition is not known. What increases the risk? This condition is more likely to develop in:  People who are 70-72 years old.  People who have a family history of lipomas. What are the signs or symptoms? A lipoma usually appears as a small, round bump under the skin. It may feel soft or rubbery, but the firmness can vary. Most lipomas are not painful. However, a lipoma may become painful if it is located in an area where it pushes on nerves. How is this diagnosed? A lipoma can usually be diagnosed with a physical exam. You may also have tests to confirm the diagnosis and to rule out other conditions. Tests may include:  Imaging tests, such as a CT scan or MRI.  Removal of a tissue sample to be looked at under a microscope (biopsy). How is this treated? Treatment is not needed for small lipomas that are not causing problems. If a lipoma continues to get bigger or it causes problems, removal is often the best option. Lipomas can also be removed to improve appearance. Removal of a lipoma is usually done with a surgery in which the fatty cells and the surrounding capsule are removed. Most often, a medicine that numbs the area (local anesthetic) is used for this procedure. Follow these instructions at home:  Keep all follow-up visits as directed by your  health care provider. This is important. Contact a health care provider if:  Your lipoma becomes larger or hard.  Your lipoma becomes painful, red, or increasingly swollen. These could be signs of infection or a more serious condition. This information is not intended to replace advice given to you by your health care provider. Make sure you discuss any questions you have with your health care provider. Document Released: 12/14/2001 Document Revised: 06/01/2015 Document Reviewed: 12/20/2013 Elsevier Interactive Patient Education  2017 [place] Maintenance, Female Adopting a healthy lifestyle and getting preventive care can go a long way to promote health and wellness. Talk with your health care provider about what schedule of regular examinations is right for you. This is a good chance for you to check in with your provider about disease prevention and staying healthy. In between checkups, there are plenty of things you can do on your own. Experts have done a lot of research about which lifestyle changes and preventive measures are most likely to keep you healthy. Ask your health care provider for more information. Weight and diet Eat a healthy diet  Be sure to include plenty of vegetables, fruits, low-fat dairy products, and lean protein.  Do not eat a lot of foods high in solid fats, added sugars, or salt.  Get regular exercise. This is one of the most important things you can do for your health.  Most adults should exercise for at least  150 minutes each week. The exercise should increase your heart rate and make you sweat (moderate-intensity exercise).  Most adults should also do strengthening exercises at least twice a week. This is in addition to the moderate-intensity exercise. Maintain a healthy weight  Body mass index (BMI) is a measurement that can be used to identify possible weight problems. It estimates body fat based on height and weight. Your health care  provider can help determine your BMI and help you achieve or maintain a healthy weight.  For females 50 years of age and older:  A BMI below 18.5 is considered underweight.  A BMI of 18.5 to 24.9 is normal.  A BMI of 25 to 29.9 is considered overweight.  A BMI of 30 and above is considered obese. Watch levels of cholesterol and blood lipids  You should start having your blood tested for lipids and cholesterol at 32 years of age, then have this test every 5 years.  You may need to have your cholesterol levels checked more often if:  Your lipid or cholesterol levels are high.  You are older than 32 years of age.  You are at high risk for heart disease. Cancer screening Lung Cancer  Lung cancer screening is recommended for adults 62-76 years old who are at high risk for lung cancer because of a history of smoking.  A yearly low-dose CT scan of the lungs is recommended for people who:  Currently smoke.  Have quit within the past 15 years.  Have at least a 30-pack-year history of smoking. A pack year is smoking an average of one pack of cigarettes a day for 1 year.  Yearly screening should continue until it has been 15 years since you quit.  Yearly screening should stop if you develop a health problem that would prevent you from having lung cancer treatment. Breast Cancer  Practice breast self-awareness. This means understanding how your breasts normally appear and feel.  It also means doing regular breast self-exams. Let your health care provider know about any changes, no matter how small.  If you are in your 20s or 30s, you should have a clinical breast exam (CBE) by a health care provider every 1-3 years as part of a regular health exam.  If you are 86 or older, have a CBE every year. Also consider having a breast X-ray (mammogram) every year.  If you have a family history of breast cancer, talk to your health care provider about genetic screening.  If you are at high  risk for breast cancer, talk to your health care provider about having an MRI and a mammogram every year.  Breast cancer gene (BRCA) assessment is recommended for women who have family members with BRCA-related cancers. BRCA-related cancers include:  Breast.  Ovarian.  Tubal.  Peritoneal cancers.  Results of the assessment will determine the need for genetic counseling and BRCA1 and BRCA2 testing. Cervical Cancer  Your health care provider may recommend that you be screened regularly for cancer of the pelvic organs (ovaries, uterus, and vagina). This screening involves a pelvic examination, including checking for microscopic changes to the surface of your cervix (Pap test). You may be encouraged to have this screening done every 3 years, beginning at age 25.  For women ages 85-65, health care providers may recommend pelvic exams and Pap testing every 3 years, or they may recommend the Pap and pelvic exam, combined with testing for human papilloma virus (HPV), every 5 years. Some types of HPV increase  your risk of cervical cancer. Testing for HPV may also be done on women of any age with unclear Pap test results.  Other health care providers may not recommend any screening for nonpregnant women who are considered low risk for pelvic cancer and who do not have symptoms. Ask your health care provider if a screening pelvic exam is right for you.  If you have had past treatment for cervical cancer or a condition that could lead to cancer, you need Pap tests and screening for cancer for at least 20 years after your treatment. If Pap tests have been discontinued, your risk factors (such as having a new sexual partner) need to be reassessed to determine if screening should resume. Some women have medical problems that increase the chance of getting cervical cancer. In these cases, your health care provider may recommend more frequent screening and Pap tests. Colorectal Cancer  This type of cancer can  be detected and often prevented.  Routine colorectal cancer screening usually begins at 32 years of age and continues through 32 years of age.  Your health care provider may recommend screening at an earlier age if you have risk factors for colon cancer.  Your health care provider may also recommend using home test kits to check for hidden blood in the stool.  A small camera at the end of a tube can be used to examine your colon directly (sigmoidoscopy or colonoscopy). This is done to check for the earliest forms of colorectal cancer.  Routine screening usually begins at age 90.  Direct examination of the colon should be repeated every 5-10 years through 32 years of age. However, you may need to be screened more often if early forms of precancerous polyps or small growths are found. Skin Cancer  Check your skin from head to toe regularly.  Tell your health care provider about any new moles or changes in moles, especially if there is a change in a mole's shape or color.  Also tell your health care provider if you have a mole that is larger than the size of a pencil eraser.  Always use sunscreen. Apply sunscreen liberally and repeatedly throughout the day.  Protect yourself by wearing long sleeves, pants, a wide-brimmed hat, and sunglasses whenever you are outside. Heart disease, diabetes, and high blood pressure  High blood pressure causes heart disease and increases the risk of stroke. High blood pressure is more likely to develop in:  People who have blood pressure in the high end of the normal range (130-139/85-89 mm Hg).  People who are overweight or obese.  People who are African American.  If you are 40-57 years of age, have your blood pressure checked every 3-5 years. If you are 57 years of age or older, have your blood pressure checked every year. You should have your blood pressure measured twice-once when you are at a hospital or clinic, and once when you are not at a  hospital or clinic. Record the average of the two measurements. To check your blood pressure when you are not at a hospital or clinic, you can use:  An automated blood pressure machine at a pharmacy.  A home blood pressure monitor.  If you are between 19 years and 45 years old, ask your health care provider if you should take aspirin to prevent strokes.  Have regular diabetes screenings. This involves taking a blood sample to check your fasting blood sugar level.  If you are at a normal weight and have  a low risk for diabetes, have this test once every three years after 32 years of age.  If you are overweight and have a high risk for diabetes, consider being tested at a younger age or more often. Preventing infection Hepatitis B  If you have a higher risk for hepatitis B, you should be screened for this virus. You are considered at high risk for hepatitis B if:  You were born in a country where hepatitis B is common. Ask your health care provider which countries are considered high risk.  Your parents were born in a high-risk country, and you have not been immunized against hepatitis B (hepatitis B vaccine).  You have HIV or AIDS.  You use needles to inject street drugs.  You live with someone who has hepatitis B.  You have had sex with someone who has hepatitis B.  You get hemodialysis treatment.  You take certain medicines for conditions, including cancer, organ transplantation, and autoimmune conditions. Hepatitis C  Blood testing is recommended for:  Everyone born from [birth year] through 1966.  Anyone with known risk factors for hepatitis C. Sexually transmitted infections (STIs)  You should be screened for sexually transmitted infections (STIs) including gonorrhea and chlamydia if:  You are sexually active and are younger than 32 years of age.  You are older than 32 years of age and your health care provider tells you that you are at risk for this type of  infection.  Your sexual activity has changed since you were last screened and you are at an increased risk for chlamydia or gonorrhea. Ask your health care provider if you are at risk.  If you do not have HIV, but are at risk, it may be recommended that you take a prescription medicine daily to prevent HIV infection. This is called pre-exposure prophylaxis (PrEP). You are considered at risk if:  You are sexually active and do not regularly use condoms or know the HIV status of your partner(s).  You take drugs by injection.  You are sexually active with a partner who has HIV. Talk with your health care provider about whether you are at high risk of being infected with HIV. If you choose to begin PrEP, you should first be tested for HIV. You should then be tested every 3 months for as long as you are taking PrEP. Pregnancy  If you are premenopausal and you may become pregnant, ask your health care provider about preconception counseling.  If you may become pregnant, take 400 to 800 micrograms (mcg) of folic acid every day.  If you want to prevent pregnancy, talk to your health care provider about birth control (contraception). Osteoporosis and menopause  Osteoporosis is a disease in which the bones lose minerals and strength with aging. This can result in serious bone fractures. Your risk for osteoporosis can be identified using a bone density scan.  If you are 49 years of age or older, or if you are at risk for osteoporosis and fractures, ask your health care provider if you should be screened.  Ask your health care provider whether you should take a calcium or vitamin D supplement to lower your risk for osteoporosis.  Menopause may have certain physical symptoms and risks.  Hormone replacement therapy may reduce some of these symptoms and risks. Talk to your health care provider about whether hormone replacement therapy is right for you. Follow these instructions at home:  Schedule  regular health, dental, and eye exams.  Stay current with your  immunizations.  Do not use any tobacco products including cigarettes, chewing tobacco, or electronic cigarettes.  If you are pregnant, do not drink alcohol.  If you are breastfeeding, limit how much and how often you drink alcohol.  Limit alcohol intake to no more than 1 drink per day for nonpregnant women. One drink equals 12 ounces of beer, 5 ounces of wine, or 1 ounces of hard liquor.  Do not use street drugs.  Do not share needles.  Ask your health care provider for help if you need support or information about quitting drugs.  Tell your health care provider if you often feel depressed.  Tell your health care provider if you have ever been abused or do not feel safe at home. This information is not intended to replace advice given to you by your health care provider. Make sure you discuss any questions you have with your health care provider. Document Released: 07/09/2010 Document Revised: 06/01/2015 Document Reviewed: 09/27/2014 Elsevier Interactive Patient Education  2017 [employer].  -

## 2016-03-13 ENCOUNTER — Telehealth: Payer: Self-pay

## 2016-03-13 LAB — VITAMIN D 25 HYDROXY (VIT D DEFICIENCY, FRACTURES): Vit D, 25-Hydroxy: 29 ng/mL — ABNORMAL LOW (ref 30–100)

## 2016-03-13 NOTE — Telephone Encounter (Signed)
Contacted pt to go over lab results pt is aware of results and doesn't have any questions or concerns 

## 2016-03-19 ENCOUNTER — Ambulatory Visit (HOSPITAL_COMMUNITY)
Admission: RE | Admit: 2016-03-19 | Discharge: 2016-03-19 | Disposition: A | Discharge status: Home / Self Care | Payer: Medicaid Other | Source: Ambulatory Visit | Attending: Internal Medicine | Admitting: Internal Medicine

## 2016-03-19 DIAGNOSIS — R220 Localized swelling, mass and lump, head: Secondary | ICD-10-CM

## 2016-03-19 MED ORDER — IOPAMIDOL (ISOVUE-300) INJECTION 61%
INTRAVENOUS | Status: AC
  Filled 2016-03-19: qty 75

## 2016-04-03 ENCOUNTER — Telehealth: Payer: Self-pay

## 2016-04-03 NOTE — Telephone Encounter (Signed)
Contacted pt to go over ct results pt didn't answer will try pt again tomorrow

## 2016-04-15 ENCOUNTER — Encounter: Payer: Self-pay | Admitting: *Deleted

## 2016-04-16 ENCOUNTER — Ambulatory Visit: Payer: Medicaid Other | Admitting: Diagnostic Neuroimaging

## 2016-04-17 ENCOUNTER — Encounter: Payer: Self-pay | Admitting: Diagnostic Neuroimaging

## 2016-05-23 ENCOUNTER — Encounter: Payer: Self-pay | Admitting: Internal Medicine

## 2016-05-24 ENCOUNTER — Encounter: Payer: Self-pay | Admitting: Internal Medicine

## 2016-05-27 ENCOUNTER — Encounter: Payer: Self-pay | Admitting: Internal Medicine

## 2016-10-09 ENCOUNTER — Ambulatory Visit: Payer: Medicaid Other | Attending: Family Medicine | Admitting: Family Medicine

## 2016-10-09 ENCOUNTER — Encounter: Payer: Self-pay | Admitting: Family Medicine

## 2016-10-09 VITALS — BP 114/80 | HR 79 | Temp 97.8°F | Ht 60.0 in | Wt 124.8 lb

## 2016-10-09 DIAGNOSIS — B349 Viral infection, unspecified: Secondary | ICD-10-CM | POA: Insufficient documentation

## 2016-10-09 DIAGNOSIS — Z888 Allergy status to other drugs, medicaments and biological substances status: Secondary | ICD-10-CM | POA: Diagnosis not present

## 2016-10-09 DIAGNOSIS — Z885 Allergy status to narcotic agent status: Secondary | ICD-10-CM | POA: Insufficient documentation

## 2016-10-09 DIAGNOSIS — F209 Schizophrenia, unspecified: Secondary | ICD-10-CM | POA: Diagnosis not present

## 2016-10-09 DIAGNOSIS — Z91040 Latex allergy status: Secondary | ICD-10-CM | POA: Insufficient documentation

## 2016-10-09 DIAGNOSIS — F319 Bipolar disorder, unspecified: Secondary | ICD-10-CM | POA: Diagnosis not present

## 2016-10-09 DIAGNOSIS — M419 Scoliosis, unspecified: Secondary | ICD-10-CM | POA: Diagnosis not present

## 2016-10-09 DIAGNOSIS — R5383 Other fatigue: Secondary | ICD-10-CM | POA: Insufficient documentation

## 2016-10-09 DIAGNOSIS — Z791 Long term (current) use of non-steroidal anti-inflammatories (NSAID): Secondary | ICD-10-CM | POA: Insufficient documentation

## 2016-10-09 DIAGNOSIS — Z91030 Bee allergy status: Secondary | ICD-10-CM | POA: Diagnosis not present

## 2016-10-09 DIAGNOSIS — Z9889 Other specified postprocedural states: Secondary | ICD-10-CM | POA: Diagnosis not present

## 2016-10-09 DIAGNOSIS — M542 Cervicalgia: Secondary | ICD-10-CM | POA: Insufficient documentation

## 2016-10-09 DIAGNOSIS — Z79899 Other long term (current) drug therapy: Secondary | ICD-10-CM | POA: Diagnosis not present

## 2016-10-09 DIAGNOSIS — R42 Dizziness and giddiness: Secondary | ICD-10-CM | POA: Diagnosis not present

## 2016-10-09 DIAGNOSIS — D573 Sickle-cell trait: Secondary | ICD-10-CM | POA: Insufficient documentation

## 2016-10-09 DIAGNOSIS — D170 Benign lipomatous neoplasm of skin and subcutaneous tissue of head, face and neck: Secondary | ICD-10-CM | POA: Diagnosis not present

## 2016-10-09 NOTE — Progress Notes (Signed)
Subjective:  Patient ID: Jennifer Barber, female    DOB: [birth year]  Age: 31 y.o. MRN: [id number]  CC: Fatigue and Neck Pain   HPI Jennifer Barber is a 31 year old female who presents to the clinic with a 3 day history of fatigue, neck pain, reduced appetite, lightheadedness and occasionally 'seeing sparkles'. She was sent home from work due to the above symptoms and she denies using any OTC medications. She denies runny nose, nasal congestion, shortness of breath, chest pain, wheezing but does have some frontal sinus pressure; she denies presents of the fever. Denies contacts.  She also has a left anterior scalp swelling which has been present for years and she would like removed due to recent increase in size and it affects her self-esteem is a female she says. CT scan of the head from 03/2016 revealed benign fat-containing lesion in the left supraorbital scalp compatible with a lipoma.  Past Medical History:  Diagnosis Date  . Abnormal Pap smear   . Anemia   . Bipolar 1 disorder ([place])   . Blood dyscrasia    Sickle Cell Trait  . Depression   . Mental disorder    bipolar, schizophrenia  . Schizophrenia ([place])   . Scoliosis   . Sickle cell trait [facility])     Past Surgical History:  Procedure Laterality Date  . FEMORAL OSTEOTOMY W/ RODDING  2004   s/p accident , leg fx  . LAPAROSCOPIC CHOLECYSTECTOMY     during second pregnancy    Allergies  Allergen Reactions  . Bee Venom Swelling  . Benadryl [Diphenhydramine] Swelling  . Codeine Itching  . Latex Rash     Outpatient Medications Prior to Visit  Medication Sig Dispense Refill  . ferrous sulfate (FERROUSUL) 325 (65 FE) MG tablet Take 1 tablet (325 mg total) by mouth 3 (three) times daily with meals. 30 tablet 3  . ibuprofen (ADVIL,MOTRIN) 600 MG tablet Take 1 tablet (600 mg total) by mouth every 6 (six) hours. (Patient not taking: Reported on 10/09/2016) 30 tablet 0  . sodium chloride (OCEAN) 0.65 % SOLN nasal spray Place 1  spray into both nostrils as needed for congestion. Reported on 06/02/2015    . acyclovir (ZOVIRAX) 200 MG capsule Take 1 capsule (200 mg total) by mouth 5 (five) times daily. X 5 days prn outbreak (Patient not taking: Reported on 06/02/2015) 100 capsule 1  . amitriptyline (ELAVIL) 25 MG tablet Take 0.5 tablets (12.5 mg total) by mouth at bedtime as needed for sleep. (Patient not taking: Reported on 10/09/2016) 30 tablet 1  . docusate sodium (COLACE) 100 MG capsule Take 1 capsule (100 mg total) by mouth daily. (Patient not taking: Reported on 05/08/2015) 30 capsule 3  . fluconazole (DIFLUCAN) 150 MG tablet Take 1 tablet (150 mg total) by mouth daily. (Patient not taking: Reported on 06/02/2015) 3 tablet 0  . fluconazole (DIFLUCAN) 150 MG tablet Take 1 tablet (150 mg total) by mouth daily. (Patient not taking: Reported on 03/12/2016) 1 tablet 0   No facility-administered medications prior to visit.     ROS Review of Systems  Constitutional: Positive for appetite change and fatigue. Negative for activity change.  HENT: Negative for congestion, sinus pressure and sore throat.   Eyes: Negative for visual disturbance.  Respiratory: Negative for cough, chest tightness, shortness of breath and wheezing.   Cardiovascular: Negative for chest pain and palpitations.  Gastrointestinal: Negative for abdominal distention, abdominal pain and constipation.  Endocrine: Negative for polydipsia.  Genitourinary: Negative  for dysuria and frequency.  Musculoskeletal: Positive for neck pain. Negative for arthralgias and back pain.  Skin: Negative for rash.  Neurological: Negative for tremors, light-headedness and numbness.  Hematological: Does not bruise/bleed easily.  Psychiatric/Behavioral: Negative for agitation and behavioral problems.    Objective:  BP 114/80   Pulse 79   Temp 97.8 F (36.6 C) (Oral)   Ht 5' (1.524 m)   Wt 124 lb 12.8 oz (56.6 kg)   LMP 10/01/2016   SpO2 100%   BMI 24.37 kg/m   BP/Weight  10/09/2016 03/12/2016 1/61/0960  Systolic BP 454 098 119  Diastolic BP 80 78 72  Wt. (Lbs) 124.8 135.2 127.6  BMI 24.37 26.4 24.92      Physical Exam  Constitutional: She is oriented to person, place, and time. She appears well-developed and well-nourished.  Cardiovascular: Normal rate, normal heart sounds and intact distal pulses.   No murmur heard. Pulmonary/Chest: Effort normal and breath sounds normal. She has no wheezes. She has no rales. She exhibits no tenderness.  Abdominal: Soft. Bowel sounds are normal. She exhibits no distension and no mass. There is no tenderness.  Musculoskeletal: Normal range of motion.  Neurological: She is alert and oriented to person, place, and time.  Skin:  Soft nontender lesion (5 x 4 cm) on the left side of anterior scalp     Assessment & Plan:   1. Viral syndrome Rest, increase hydration Advised to use OTC Tylenol  2. Other fatigue Could be secondary to viral syndrome follow-up exclude underlying metabolic causes - CBC with Differential/Platelet - Vitamin D, 25-hydroxy - TSH  3. Lipoma of head Asymptomatic Request surgery due to cosmetic reasons - Ambulatory referral to General Surgery   No orders of the defined types were placed in this encounter.   Follow-up: Return in about 6 weeks (around 11/20/2016) for Complete physical exam.   Arnoldo Morale MD

## 2016-10-09 NOTE — Patient Instructions (Signed)

## 2016-10-10 ENCOUNTER — Telehealth: Payer: Self-pay

## 2016-10-10 LAB — CBC WITH DIFFERENTIAL/PLATELET
Basophils Absolute: 0.1 10*3/uL (ref 0.0–0.2)
Basos: 1 %
EOS (ABSOLUTE): 0.1 10*3/uL (ref 0.0–0.4)
Eos: 2 %
Hematocrit: 40.8 % (ref 34.0–46.6)
Hemoglobin: 12.9 g/dL (ref 11.1–15.9)
Immature Grans (Abs): 0 10*3/uL (ref 0.0–0.1)
Immature Granulocytes: 0 %
Lymphocytes Absolute: 3.1 10*3/uL (ref 0.7–3.1)
Lymphs: 44 %
MCH: 27 pg (ref 26.6–33.0)
MCHC: 31.6 g/dL (ref 31.5–35.7)
MCV: 85 fL (ref 79–97)
Monocytes Absolute: 0.3 10*3/uL (ref 0.1–0.9)
Monocytes: 5 %
Neutrophils Absolute: 3.5 10*3/uL (ref 1.4–7.0)
Neutrophils: 48 %
Platelets: 267 10*3/uL (ref 150–379)
RBC: 4.78 x10E6/uL (ref 3.77–5.28)
RDW: 13.8 % (ref 12.3–15.4)
WBC: 7.1 10*3/uL (ref 3.4–10.8)

## 2016-10-10 LAB — VITAMIN D 25 HYDROXY (VIT D DEFICIENCY, FRACTURES): Vit D, 25-Hydroxy: 34.4 ng/mL (ref 30.0–100.0)

## 2016-10-10 LAB — TSH: TSH: 1.07 u[IU]/mL (ref 0.450–4.500)

## 2016-10-10 NOTE — Telephone Encounter (Signed)
Pt was called and there is no VM set up to leave a message. Will try and call patient again.

## 2016-10-24 ENCOUNTER — Telehealth: Payer: Self-pay | Admitting: Family Medicine

## 2016-10-24 NOTE — Telephone Encounter (Signed)
Pt called to request a refill for her lab result, please follow up

## 2016-10-28 ENCOUNTER — Ambulatory Visit: Payer: Self-pay | Admitting: Family Medicine

## 2016-12-06 ENCOUNTER — Ambulatory Visit: Payer: Medicaid Other | Attending: Family Medicine | Admitting: Licensed Clinical Social Worker

## 2016-12-06 ENCOUNTER — Encounter: Payer: Self-pay | Admitting: Family Medicine

## 2016-12-06 ENCOUNTER — Ambulatory Visit: Payer: Medicaid Other | Attending: Family Medicine | Admitting: Family Medicine

## 2016-12-06 ENCOUNTER — Other Ambulatory Visit (HOSPITAL_COMMUNITY)
Admission: RE | Admit: 2016-12-06 | Discharge: 2016-12-06 | Disposition: A | Discharge status: Home / Self Care | Payer: Medicaid Other | Source: Ambulatory Visit | Attending: Family Medicine | Admitting: Family Medicine

## 2016-12-06 VITALS — BP 116/78 | HR 85 | Temp 98.0°F | Ht 60.0 in | Wt 128.8 lb

## 2016-12-06 DIAGNOSIS — G4709 Other insomnia: Secondary | ICD-10-CM

## 2016-12-06 DIAGNOSIS — Z124 Encounter for screening for malignant neoplasm of cervix: Secondary | ICD-10-CM | POA: Diagnosis not present

## 2016-12-06 DIAGNOSIS — Z113 Encounter for screening for infections with a predominantly sexual mode of transmission: Secondary | ICD-10-CM

## 2016-12-06 DIAGNOSIS — D573 Sickle-cell trait: Secondary | ICD-10-CM | POA: Diagnosis not present

## 2016-12-06 DIAGNOSIS — Z91040 Latex allergy status: Secondary | ICD-10-CM | POA: Diagnosis not present

## 2016-12-06 DIAGNOSIS — F419 Anxiety disorder, unspecified: Principal | ICD-10-CM

## 2016-12-06 DIAGNOSIS — F319 Bipolar disorder, unspecified: Secondary | ICD-10-CM | POA: Diagnosis not present

## 2016-12-06 DIAGNOSIS — Z91030 Bee allergy status: Secondary | ICD-10-CM | POA: Diagnosis not present

## 2016-12-06 DIAGNOSIS — Z888 Allergy status to other drugs, medicaments and biological substances status: Secondary | ICD-10-CM | POA: Diagnosis not present

## 2016-12-06 DIAGNOSIS — Z0000 Encounter for general adult medical examination without abnormal findings: Secondary | ICD-10-CM | POA: Diagnosis present

## 2016-12-06 DIAGNOSIS — Z9049 Acquired absence of other specified parts of digestive tract: Secondary | ICD-10-CM | POA: Diagnosis not present

## 2016-12-06 DIAGNOSIS — M419 Scoliosis, unspecified: Secondary | ICD-10-CM | POA: Diagnosis not present

## 2016-12-06 DIAGNOSIS — F329 Major depressive disorder, single episode, unspecified: Secondary | ICD-10-CM

## 2016-12-06 DIAGNOSIS — G4700 Insomnia, unspecified: Secondary | ICD-10-CM | POA: Insufficient documentation

## 2016-12-06 DIAGNOSIS — Z885 Allergy status to narcotic agent status: Secondary | ICD-10-CM | POA: Insufficient documentation

## 2016-12-06 DIAGNOSIS — F32A Depression, unspecified: Secondary | ICD-10-CM

## 2016-12-06 MED ORDER — HYDROXYZINE HCL 10 MG PO TABS: 10.0000 mg | ORAL_TABLET | Freq: Three times a day (TID) | ORAL | 2 refills | Status: DC | PRN

## 2016-12-06 NOTE — BH Specialist Note (Signed)
Integrated Behavioral Health Initial Visit  MRN: [id number] Name: Jennifer Barber  Number of [place] Clinician visits:: 1/6 Session Start time: 4:30 PM  Session End time: 4:50 PM Total time: 20 minutes  Type of Service: [place] Interpretor:No. Interpretor Name and Language: N.A   Warm Hand Off Completed.       SUBJECTIVE: Jennifer Barber is a 31 y.o. female accompanied by self Patient was referred by Dr. Jarold Song for depression and anxiety. Patient reports the following symptoms/concerns: feelings of sadness and worry, difficulty sleeping, low energy, decreased concentration, and irritability Duration of problem: Ongoing; Severity of problem: moderate  OBJECTIVE: Mood: Anxious and Affect: Appropriate Risk of harm to self or others: No plan to harm self or others  LIFE CONTEXT: Family and Social: Pt resides with partner and minor child. She receives support from family. School/Work: Pt and partner are employed full time. Family receives food stamps [phone]) Self-Care: Pt reported smoking cigarettes "when stressed" No additional substance use reported Life Changes: Pt experiencing increased stress. Partner is out of the home majority of the week and pt is solely responsible for minor child with limited family support.   GOALS ADDRESSED: Patient will: 1. Reduce symptoms of: anxiety, depression and stress 2. Increase knowledge and/or ability of: coping skills  3. Demonstrate ability to: Increase healthy adjustment to current life circumstances and Increase adequate support systems for patient/family  INTERVENTIONS: Interventions utilized: Mindfulness or Relaxation Training and Supportive Counseling  Standardized Assessments completed: GAD-7 and PHQ 2&9  ASSESSMENT: Patient currently experiencing depression and anxiety triggered by stress. Pt reports feelings of sadness and worry, difficulty sleeping, low energy, decreased  concentration, and irritability. She denied SI/HI/AVH.    Patient may benefit from psychotherapy and medication management. LCSWA educated pt on mindfulness interventions to assist with decreasing symptoms. Pt successfully identified healthy coping skills (guided meditation and crafting) to utilize on a routine basis. Pt is agreeable to participating in medication management. Referral was completed.   PLAN: 1. Follow up with behavioral health clinician on : Pt was encouraged to contact LCSWA if symptoms worsen or fail to improve to schedule behavioral appointments at [facility] 2. Behavioral recommendations: LCSWA recommends that pt apply healthy coping skills discussed. Pt is encouraged to schedule follow up appointment with LCSWA 3. Referral(s): Psychiatrist 4. "From scale of 1-10, how likely are you to follow plan?": 10/10  Rebekah [place], LCSW 12/09/16 4:34 PM

## 2016-12-06 NOTE — Patient Instructions (Signed)

## 2016-12-06 NOTE — Progress Notes (Signed)
Subjective:  Patient ID: Jennifer Barber, female    DOB: 1985-10-10  Age: 31 y.o. MRN: [id number]  CC: Annual Exam   HPI Jennifer Barber presents for an annual physical exam would like to be tested for STDs.  She complains of insomnia and a sense of anxiety and depression which she previously had  but it resolved sometime back. Her chart reveals a history of schizophrenia and bipolar disorder and she informs me she was previously followed by Lexine Baton of care and was on psychotropic medications. She denies suicidal ideation or intents.  Past Medical History:  Diagnosis Date  . Abnormal Pap smear   . Anemia   . Bipolar 1 disorder ([place])   . Blood dyscrasia    Sickle Cell Trait  . Depression   . Mental disorder    bipolar, schizophrenia  . Schizophrenia ([place])   . Scoliosis   . Sickle cell trait [facility])     Past Surgical History:  Procedure Laterality Date  . FEMORAL OSTEOTOMY W/ RODDING  2004   s/p accident , leg fx  . LAPAROSCOPIC CHOLECYSTECTOMY     during second pregnancy    Allergies  Allergen Reactions  . Bee Venom Swelling  . Benadryl [Diphenhydramine] Swelling  . Codeine Itching  . Latex Rash      Outpatient Medications Prior to Visit  Medication Sig Dispense Refill  . sodium chloride (OCEAN) 0.65 % SOLN nasal spray Place 1 spray into both nostrils as needed for congestion. Reported on 06/02/2015    . ferrous sulfate (FERROUSUL) 325 (65 FE) MG tablet Take 1 tablet (325 mg total) by mouth 3 (three) times daily with meals. 30 tablet 3  . ibuprofen (ADVIL,MOTRIN) 600 MG tablet Take 1 tablet (600 mg total) by mouth every 6 (six) hours. (Patient not taking: Reported on 10/09/2016) 30 tablet 0   No facility-administered medications prior to visit.     ROS Review of Systems  Constitutional: Negative for activity change, appetite change and fatigue.  HENT: Negative for congestion, sinus pressure and sore throat.   Eyes: Negative for visual disturbance.    Respiratory: Negative for cough, chest tightness, shortness of breath and wheezing.   Cardiovascular: Negative for chest pain and palpitations.  Gastrointestinal: Negative for abdominal distention, abdominal pain and constipation.  Endocrine: Negative for polydipsia.  Genitourinary: Negative for dysuria and frequency.  Musculoskeletal: Negative for arthralgias and back pain.  Skin: Negative for rash.  Neurological: Negative for tremors, light-headedness and numbness.  Hematological: Does not bruise/bleed easily.  Psychiatric/Behavioral: Positive for sleep disturbance. Negative for agitation and behavioral problems.       Anxiety and depression    Objective:  BP 116/78   Pulse 85   Temp 98 F (36.7 C) (Oral)   Ht 5' (1.524 m)   Wt 128 lb 12.8 oz (58.4 kg)   SpO2 100%   BMI 25.15 kg/m   BP/Weight 12/06/2016 48/01/8561 01/10/9700  Systolic BP 637 858 850  Diastolic BP 78 80 78  Wt. (Lbs) 128.8 124.8 135.2  BMI 25.15 24.37 26.4      Physical Exam  Constitutional: She is oriented to person, place, and time. She appears well-developed and well-nourished. No distress.  HENT:  Head: Normocephalic.  Right Ear: External ear normal.  Left Ear: External ear normal.  Nose: Nose normal.  Mouth/Throat: Oropharynx is clear and moist.  Eyes: Conjunctivae and EOM are normal. Pupils are equal, round, and reactive to light.  Neck: Normal range of motion. No  JVD present.  Cardiovascular: Normal rate, regular rhythm, normal heart sounds and intact distal pulses. Exam reveals no gallop.  No murmur heard. Pulmonary/Chest: Effort normal and breath sounds normal. No respiratory distress. She has no wheezes. She has no rales. She exhibits no tenderness. Right breast exhibits no mass and no tenderness. Left breast exhibits no mass and no tenderness.  Abdominal: Soft. Bowel sounds are normal. She exhibits no distension and no mass. There is no tenderness.  Genitourinary:  Genitourinary Comments:  External genitalia-normal Vagina-off-white discharge Cervix-normal Adnexa-normal, no cervical motion tenderness  Musculoskeletal: Normal range of motion. She exhibits no edema or tenderness.  Neurological: She is alert and oriented to person, place, and time. She has normal reflexes.  Skin: Skin is warm and dry. She is not diaphoretic.  Psychiatric: She has a normal mood and affect.     Assessment & Plan:   1. Annual physical exam Counseled on 150 minutes of exercise every week, healthy eating, routine healthcare maintenance.   2. Bipolar 1 disorder ([place]) Referred to psychiatry for optimization of management LCSW called into assist with  expediting referral  3. Screening for cervical cancer - Cytology - PAP [place]  4. Screening for STD (sexually transmitted disease) - HIV antibody (with reflex)  5. Other insomnia Placed on hydroxyzine which also help with anxiety We will see back in 3 months to coordinate care and ensure she has been seen by psychiatry  Meds ordered this encounter  Medications  . hydrOXYzine (ATARAX/VISTARIL) 10 MG tablet    Sig: Take 1 tablet (10 mg total) by mouth 3 (three) times daily as needed.    Dispense:  90 tablet    Refill:  2    Follow-up: Return in about 3 months (around 03/06/2017) for follow up on insomnia and bipolar disorder.   Arnoldo Morale MD

## 2016-12-07 LAB — HIV ANTIBODY (ROUTINE TESTING W REFLEX): HIV Screen 4th Generation wRfx: NONREACTIVE

## 2016-12-09 ENCOUNTER — Telehealth: Payer: Self-pay | Admitting: Licensed Clinical Social Worker

## 2016-12-09 NOTE — Telephone Encounter (Signed)
Call placed to [facility]. Latoya confirmed receipt of psychiatry referral.

## 2016-12-10 ENCOUNTER — Telehealth: Payer: Self-pay | Admitting: Family Medicine

## 2016-12-10 LAB — CYTOLOGY - PAP
Bacterial vaginitis: NEGATIVE
Candida vaginitis: NEGATIVE
Chlamydia: NEGATIVE
Diagnosis: NEGATIVE
HPV: NOT DETECTED
Neisseria Gonorrhea: NEGATIVE
Trichomonas: NEGATIVE

## 2016-12-10 NOTE — Telephone Encounter (Signed)
Pt name and DOB verified. Pt aware of results from lab test.

## 2016-12-10 NOTE — Progress Notes (Signed)
Pt was given results by travia.

## 2016-12-10 NOTE — Telephone Encounter (Signed)
Call placed to Dr. Delfin Gant office [phone] and I was informed that patient has an appointment with them on 12/31 at 10am.

## 2016-12-10 NOTE — Telephone Encounter (Signed)
Pt called for lab result, the information was given by the triage nurse

## 2016-12-11 LAB — CERVICOVAGINAL ANCILLARY ONLY: Herpes: NEGATIVE

## 2016-12-12 NOTE — Telephone Encounter (Signed)
Pt was called and informed of lab results. 

## 2017-01-30 DIAGNOSIS — F411 Generalized anxiety disorder: Secondary | ICD-10-CM | POA: Diagnosis not present

## 2017-01-30 DIAGNOSIS — F4310 Post-traumatic stress disorder, unspecified: Secondary | ICD-10-CM | POA: Diagnosis not present

## 2017-01-30 DIAGNOSIS — F3132 Bipolar disorder, current episode depressed, moderate: Secondary | ICD-10-CM | POA: Diagnosis not present

## 2017-02-25 DIAGNOSIS — F411 Generalized anxiety disorder: Secondary | ICD-10-CM | POA: Diagnosis not present

## 2017-02-25 DIAGNOSIS — F3132 Bipolar disorder, current episode depressed, moderate: Secondary | ICD-10-CM | POA: Diagnosis not present

## 2017-02-25 DIAGNOSIS — F4310 Post-traumatic stress disorder, unspecified: Secondary | ICD-10-CM | POA: Diagnosis not present

## 2017-03-14 ENCOUNTER — Ambulatory Visit: Payer: Medicaid Other | Admitting: Family Medicine

## 2017-06-09 DIAGNOSIS — F3132 Bipolar disorder, current episode depressed, moderate: Secondary | ICD-10-CM | POA: Diagnosis not present

## 2017-06-09 DIAGNOSIS — F411 Generalized anxiety disorder: Secondary | ICD-10-CM | POA: Diagnosis not present

## 2017-06-09 DIAGNOSIS — F4310 Post-traumatic stress disorder, unspecified: Secondary | ICD-10-CM | POA: Diagnosis not present

## 2017-08-25 ENCOUNTER — Encounter (INDEPENDENT_AMBULATORY_CARE_PROVIDER_SITE_OTHER): Payer: Self-pay

## 2017-08-25 ENCOUNTER — Ambulatory Visit (INDEPENDENT_AMBULATORY_CARE_PROVIDER_SITE_OTHER): Payer: BLUE CROSS/BLUE SHIELD | Admitting: Obstetrics and Gynecology

## 2017-08-25 ENCOUNTER — Encounter: Payer: Self-pay | Admitting: Obstetrics and Gynecology

## 2017-08-25 VITALS — BP 103/64 | HR 72 | Ht 60.0 in | Wt 103.0 lb

## 2017-08-25 DIAGNOSIS — Z3046 Encounter for surveillance of implantable subdermal contraceptive: Secondary | ICD-10-CM

## 2017-08-25 NOTE — Progress Notes (Signed)
Patient ID: Jennifer Barber, female   DOB: 09-26-1985, 32 y.o.   MRN: [id number]  Discussion:  This is her 3rd implantation. The 2nd one she took it out purposefully because she wanted to get pregnant and her child just turned 4. She would like to take it out because she feels it not working as it should and is losing weight but when she was on it she was gaining weight which she was happy about. She would like procedure done today if possible. 1. Discussed with pt  benefits of Nexplanon removal and reinsertion. Unable to do today, will schedule in a week.  At end of discussion, pt had opportunity to ask questions and has no further questions at this time.   Specific discussion of Nexplanon removal and reinsertion as noted above. Greater than 50% was spent in counseling and coordination of care with the patient.   Total time greater than: 15 minutes.     By signing my name below, I, Samul Dada, attest that this documentation has been prepared under the direction and in the presence of [name], MD. Electronically Signed: [place]. 08/25/17. 10:31 AM.  Hyman Bower of Attending Supervision of Advanced Practitioner: Evaluation and management procedures were performed by the PA/NP/CNM/OB Fellow, and I was available for supervision/collaboration. Chart reviewed and agree with management and plan.  [name] 08/25/2017 10:44 AM

## 2017-09-02 ENCOUNTER — Encounter: Payer: Self-pay | Admitting: Obstetrics and Gynecology

## 2017-09-02 ENCOUNTER — Ambulatory Visit (INDEPENDENT_AMBULATORY_CARE_PROVIDER_SITE_OTHER): Payer: BLUE CROSS/BLUE SHIELD | Admitting: Obstetrics and Gynecology

## 2017-09-02 VITALS — BP 97/63 | HR 72 | Ht 60.0 in | Wt 126.8 lb

## 2017-09-02 DIAGNOSIS — Z3049 Encounter for surveillance of other contraceptives: Secondary | ICD-10-CM

## 2017-09-02 DIAGNOSIS — Z30017 Encounter for initial prescription of implantable subdermal contraceptive: Secondary | ICD-10-CM

## 2017-09-02 DIAGNOSIS — Z3202 Encounter for pregnancy test, result negative: Secondary | ICD-10-CM | POA: Diagnosis not present

## 2017-09-02 LAB — POCT URINE PREGNANCY: Preg Test, Ur: NEGATIVE

## 2017-09-02 MED ORDER — ETONOGESTREL 68 MG ~~LOC~~ IMPL
68.0000 mg | DRUG_IMPLANT | Freq: Once | SUBCUTANEOUS | Status: AC
Start: 2017-09-02 — End: 2017-09-02
  Administered 2017-09-02: 68 mg via SUBCUTANEOUS

## 2017-09-02 NOTE — Progress Notes (Signed)
Nexplanon Removal Procedure:  Patient given informed consent for removal of her Implanon, time out was performed. Signed copy in the chart. Appropriate time out taken. Implanon site identified. Area prepped in usual sterile fashon. One cc of 1% lidocaine was used to anesthetize the area at the distal end of the implant. A small stab incision was made right beside the implant on the distal portion.  The implanon rod was grasped using hemostats and removed without difficulty. There was less than 3 cc blood loss. There were no complications. A small amount of antibiotic ointment and steri-strips were applied over the small incision. A pressure bandage was applied to reduce any bruising. The patient tolerated the procedure well and was given post procedure instructions.  Nexplanon Reinsertion Procedure:  Patient given informed consent, signed copy in the chart, time out was performed. Pregnancy test was negative. Appropriate time out taken. Patient's left arm was prepped and draped in the usual sterile fashion. The ruler used to measure and mark insertion area. Pt was prepped with alcohol swab and then injected with 3 cc of 1 % lidocaine. Pt was prepped with betadine, Nexplanon removed form packaging. Then inserted per standard guidelines. Patient and provider were able to palpate rod under skin. Pt insertion site covered with sterile dressing. Minimal blood loss. Pt tolerated the procedure well and was given post procedure instructions.   A:  1. Nexplanon Removal and Reinsertion  P: 1. Follow up PRN or 1 month  By signing my name below, I, Soijett Blue, attest that this documentation has been prepared under the direction and in the presence of [name], MD. Electronically Signed: Soijett Blue, Presenter, broadcasting. 09/02/17. 3:10 PM.  I personally performed the services described in this documentation, which was SCRIBED in my presence. The recorded information has been reviewed and considered  accurate. It has been edited as necessary during review. [name], MD

## 2017-09-02 NOTE — Addendum Note (Signed)
Addended by: Diona Fanti A on: 09/02/2017 04:07 PM   Modules accepted: Orders

## 2017-09-09 DIAGNOSIS — F4310 Post-traumatic stress disorder, unspecified: Secondary | ICD-10-CM | POA: Diagnosis not present

## 2017-09-09 DIAGNOSIS — F3132 Bipolar disorder, current episode depressed, moderate: Secondary | ICD-10-CM | POA: Diagnosis not present

## 2017-09-09 DIAGNOSIS — F411 Generalized anxiety disorder: Secondary | ICD-10-CM | POA: Diagnosis not present

## 2017-10-01 ENCOUNTER — Encounter: Payer: Self-pay | Admitting: *Deleted

## 2017-10-01 ENCOUNTER — Ambulatory Visit: Payer: BLUE CROSS/BLUE SHIELD | Admitting: Obstetrics and Gynecology

## 2017-10-09 ENCOUNTER — Ambulatory Visit: Payer: BLUE CROSS/BLUE SHIELD | Attending: Family Medicine | Admitting: Family Medicine

## 2017-10-09 ENCOUNTER — Encounter: Payer: Self-pay | Admitting: Family Medicine

## 2017-10-09 VITALS — BP 107/70 | HR 64 | Temp 98.0°F | Ht 60.0 in | Wt 130.0 lb

## 2017-10-09 DIAGNOSIS — Z0000 Encounter for general adult medical examination without abnormal findings: Secondary | ICD-10-CM | POA: Insufficient documentation

## 2017-10-09 DIAGNOSIS — Z885 Allergy status to narcotic agent status: Secondary | ICD-10-CM | POA: Insufficient documentation

## 2017-10-09 DIAGNOSIS — Z13228 Encounter for screening for other metabolic disorders: Secondary | ICD-10-CM

## 2017-10-09 DIAGNOSIS — D573 Sickle-cell trait: Secondary | ICD-10-CM | POA: Insufficient documentation

## 2017-10-09 DIAGNOSIS — F319 Bipolar disorder, unspecified: Secondary | ICD-10-CM | POA: Diagnosis not present

## 2017-10-09 DIAGNOSIS — Z888 Allergy status to other drugs, medicaments and biological substances status: Secondary | ICD-10-CM | POA: Diagnosis not present

## 2017-10-09 DIAGNOSIS — Z23 Encounter for immunization: Secondary | ICD-10-CM | POA: Insufficient documentation

## 2017-10-09 DIAGNOSIS — Z79899 Other long term (current) drug therapy: Secondary | ICD-10-CM | POA: Insufficient documentation

## 2017-10-09 DIAGNOSIS — F209 Schizophrenia, unspecified: Secondary | ICD-10-CM | POA: Diagnosis not present

## 2017-10-09 NOTE — Progress Notes (Signed)
Subjective:  Patient ID: Jennifer Barber, female    DOB: 1985/02/18  Age: 32 y.o. MRN: [id number]  CC: Annual Exam   HPI MARCELIA PETERSEN is a 32 year old female with a history of bipolar disorder who presents today for an annual physical but review of her chart indicates she had a physical exam 10 months ago on 12/06/2017. She also has a form from her job to be completed to this effect and is requiring results of lipid panel. Her bipolar disorder is [facility] where she receives Lamictal and hydroxyzine and reports control of her symptoms. She is willing to receive the flu shot today  Past Medical History:  Diagnosis Date  . Abnormal Pap smear   . Anemia   . Bipolar 1 disorder ([place])   . Blood dyscrasia    Sickle Cell Trait  . Depression   . Mental disorder    bipolar, schizophrenia  . Schizophrenia ([place])   . Scoliosis   . Sickle cell trait [facility])     Past Surgical History:  Procedure Laterality Date  . FEMORAL OSTEOTOMY W/ RODDING  2004   s/p accident , leg fx  . LAPAROSCOPIC CHOLECYSTECTOMY     during second pregnancy    Allergies  Allergen Reactions  . Bee Venom Swelling  . Benadryl [Diphenhydramine] Swelling  . Codeine Itching  . Latex Rash     Outpatient Medications Prior to Visit  Medication Sig Dispense Refill  . hydrOXYzine (ATARAX/VISTARIL) 10 MG tablet Take 1 tablet (10 mg total) by mouth 3 (three) times daily as needed. 90 tablet 2  . ibuprofen (ADVIL,MOTRIN) 600 MG tablet Take 1 tablet (600 mg total) by mouth every 6 (six) hours. 30 tablet 0  . lamoTRIgine (LAMICTAL) 25 MG tablet Take 25 mg by mouth daily.    . sodium chloride (OCEAN) 0.65 % SOLN nasal spray Place 1 spray into both nostrils as needed for congestion. Reported on 06/02/2015    . ferrous sulfate (FERROUSUL) 325 (65 FE) MG tablet Take 1 tablet (325 mg total) by mouth 3 (three) times daily with meals. 30 tablet 3   No facility-administered medications prior to visit.       ROS Review of Systems  Constitutional: Negative for activity change, appetite change and fatigue.  HENT: Negative for congestion, sinus pressure and sore throat.   Eyes: Negative for visual disturbance.  Respiratory: Negative for cough, chest tightness, shortness of breath and wheezing.   Cardiovascular: Negative for chest pain and palpitations.  Gastrointestinal: Negative for abdominal distention, abdominal pain and constipation.  Endocrine: Negative for polydipsia.  Genitourinary: Negative for dysuria and frequency.  Musculoskeletal: Negative for arthralgias and back pain.  Skin: Negative for rash.  Neurological: Negative for tremors, light-headedness and numbness.  Hematological: Does not bruise/bleed easily.  Psychiatric/Behavioral: Negative for agitation and behavioral problems.    Objective:  BP 107/70   Pulse 64   Temp 98 F (36.7 C) (Oral)   Ht 5' (1.524 m)   Wt 130 lb (59 kg)   SpO2 100%   BMI 25.39 kg/m   BP/Weight 10/09/2017 09/02/2017 08/31/2351  Systolic BP 614 97 431  Diastolic BP 70 63 64  Wt. (Lbs) 130 126.8 103  BMI 25.39 24.76 20.12      Physical Exam  Constitutional: She is oriented to person, place, and time. She appears well-developed and well-nourished.  HENT:  Right Ear: External ear normal.  Left Ear: External ear normal.  Mouth/Throat: Oropharynx is clear and moist.  Cardiovascular: Normal rate, normal heart sounds and intact distal pulses.  No murmur heard. Pulmonary/Chest: Effort normal and breath sounds normal. She has no wheezes. She has no rales. She exhibits no tenderness.  Abdominal: Soft. Bowel sounds are normal. She exhibits no distension and no mass. There is no tenderness.  Musculoskeletal: Normal range of motion.  Neurological: She is alert and oriented to person, place, and time.  Skin: Skin is warm.  Psychiatric: She has a normal mood and affect.     Assessment & Plan:   1. Bipolar 1 disorder ([facility]) Stable Continue  Lamictal and hydroxyzine Keep appointment with psychiatry  2. Screening for metabolic disorder - Lipid panel; Future - CMP14+EGFR; Future  3. Need for immunization against influenza - Flu Vaccine QUAD 36+ mos IM   No orders of the defined types were placed in this encounter.   Follow-up: Return in about 1 year (around 10/10/2018) for Annual physical.   Charlott Rakes MD

## 2017-10-10 ENCOUNTER — Ambulatory Visit: Payer: BLUE CROSS/BLUE SHIELD | Attending: Family Medicine

## 2017-10-10 DIAGNOSIS — Z13228 Encounter for screening for other metabolic disorders: Secondary | ICD-10-CM | POA: Insufficient documentation

## 2017-10-10 NOTE — Progress Notes (Signed)
Patient here for lab visit  

## 2017-10-11 LAB — CMP14+EGFR
ALT: 10 IU/L (ref 0–32)
AST: 14 IU/L (ref 0–40)
Albumin/Globulin Ratio: 1.8 (ref 1.2–2.2)
Albumin: 4.4 g/dL (ref 3.5–5.5)
Alkaline Phosphatase: 39 IU/L (ref 39–117)
BUN/Creatinine Ratio: 13 (ref 9–23)
BUN: 11 mg/dL (ref 6–20)
Bilirubin Total: 0.4 mg/dL (ref 0.0–1.2)
CO2: 22 mmol/L (ref 20–29)
Calcium: 9.4 mg/dL (ref 8.7–10.2)
Chloride: 106 mmol/L (ref 96–106)
Creatinine, Ser: 0.83 mg/dL (ref 0.57–1.00)
GFR calc Af Amer: 107 mL/min/{1.73_m2} (ref >59)
GFR calc non Af Amer: 93 mL/min/{1.73_m2} (ref >59)
Globulin, Total: 2.4 g/dL (ref 1.5–4.5)
Glucose: 74 mg/dL (ref 65–99)
Potassium: 4.1 mmol/L (ref 3.5–5.2)
Sodium: 140 mmol/L (ref 134–144)
Total Protein: 6.8 g/dL (ref 6.0–8.5)

## 2017-10-11 LAB — LIPID PANEL
Chol/HDL Ratio: 2.8 ratio (ref 0.0–4.4)
Cholesterol, Total: 125 mg/dL (ref 100–199)
HDL: 45 mg/dL (ref >39)
LDL Calculated: 73 mg/dL (ref 0–99)
Triglycerides: 36 mg/dL (ref 0–149)
VLDL Cholesterol Cal: 7 mg/dL (ref 5–40)

## 2017-10-15 ENCOUNTER — Telehealth: Payer: Self-pay

## 2017-10-15 NOTE — Telephone Encounter (Signed)
Patient was called and informed of lab results. Patient was also informed of paperwork being mailed out to her on 10/15/17 due to fax keep failing.

## 2017-10-15 NOTE — Telephone Encounter (Signed)
-----   Message from Charlott Rakes, MD sent at 10/13/2017  8:39 AM EDT ----- Please inform the patient that labs are normal. Thank you.

## 2017-10-28 ENCOUNTER — Encounter: Payer: Self-pay | Admitting: Nurse Practitioner

## 2017-10-28 ENCOUNTER — Telehealth: Payer: Self-pay | Admitting: Family Medicine

## 2017-10-28 ENCOUNTER — Ambulatory Visit: Payer: BLUE CROSS/BLUE SHIELD | Attending: Nurse Practitioner | Admitting: Nurse Practitioner

## 2017-10-28 VITALS — BP 107/74 | HR 88 | Temp 98.7°F | Ht 60.0 in | Wt 130.0 lb

## 2017-10-28 DIAGNOSIS — Z888 Allergy status to other drugs, medicaments and biological substances status: Secondary | ICD-10-CM | POA: Insufficient documentation

## 2017-10-28 DIAGNOSIS — F209 Schizophrenia, unspecified: Secondary | ICD-10-CM | POA: Insufficient documentation

## 2017-10-28 DIAGNOSIS — Z79899 Other long term (current) drug therapy: Secondary | ICD-10-CM | POA: Diagnosis not present

## 2017-10-28 DIAGNOSIS — F329 Major depressive disorder, single episode, unspecified: Secondary | ICD-10-CM | POA: Diagnosis not present

## 2017-10-28 DIAGNOSIS — D573 Sickle-cell trait: Secondary | ICD-10-CM | POA: Insufficient documentation

## 2017-10-28 DIAGNOSIS — Z791 Long term (current) use of non-steroidal anti-inflammatories (NSAID): Secondary | ICD-10-CM | POA: Insufficient documentation

## 2017-10-28 DIAGNOSIS — Z885 Allergy status to narcotic agent status: Secondary | ICD-10-CM | POA: Diagnosis not present

## 2017-10-28 DIAGNOSIS — H5711 Ocular pain, right eye: Secondary | ICD-10-CM | POA: Insufficient documentation

## 2017-10-28 MED ORDER — BACITRACIN-POLYMYXIN B 500-10000 UNIT/GM OP OINT: 1.0000 "application " | TOPICAL_OINTMENT | Freq: Four times a day (QID) | OPHTHALMIC | 0 refills | Status: AC

## 2017-10-28 NOTE — Progress Notes (Signed)
Assessment & Plan:  Jennifer Barber was seen today for eye pain.  Diagnoses and all orders for this visit:  Acute right eye pain -     bacitracin-polymyxin b (POLYSPORIN) ophthalmic ointment; Place 1 application into the right eye 4 (four) times daily for 7 days.    Patient has been counseled on age-appropriate routine health concerns for screening and prevention. These are reviewed and up-to-date. Referrals have been placed accordingly. Immunizations are up-to-date or declined.    Subjective:   Chief Complaint  Patient presents with  . Eye Pain    Pt. stated her right eye is giving her pain. Pt. stated when airs hit, it irritates her eyes.  Pt. stated she think she had a stye on her right eye previously. Pt. stated she do not know if it is pink eye.    Eye Problem   The right eye is affected. This is a new problem. The current episode started in the past 7 days. The problem occurs constantly. The problem has been gradually worsening. There was no injury mechanism. The pain is at a severity of 6/10. The pain is mild. There is no known exposure to pink eye. She does not wear contacts. Associated symptoms include an eye discharge, eye redness and itching. Pertinent negatives include no blurred vision, double vision, fever, foreign body sensation, nausea, photophobia, recent URI or vomiting. She has tried eye drops for the symptoms. The treatment provided no relief.    Review of Systems  Constitutional: Negative for fever, malaise/fatigue and weight loss.  HENT: Negative.  Negative for nosebleeds.   Eyes: Positive for pain, discharge, redness and itching. Negative for blurred vision, double vision and photophobia.  Respiratory: Negative.  Negative for cough and shortness of breath.   Cardiovascular: Negative.  Negative for chest pain, palpitations and leg swelling.  Gastrointestinal: Negative.  Negative for heartburn, nausea and vomiting.  Musculoskeletal: Negative.  Negative for myalgias.    Neurological: Negative.  Negative for dizziness, focal weakness, seizures and headaches.  Psychiatric/Behavioral: Negative.  Negative for suicidal ideas.    Past Medical History:  Diagnosis Date  . Abnormal Pap smear   . Anemia   . Bipolar 1 disorder ([place])   . Blood dyscrasia    Sickle Cell Trait  . Depression   . Mental disorder    bipolar, schizophrenia  . Schizophrenia ([place])   . Scoliosis   . Sickle cell trait [facility])     Past Surgical History:  Procedure Laterality Date  . FEMORAL OSTEOTOMY W/ RODDING  2004   s/p accident , leg fx  . LAPAROSCOPIC CHOLECYSTECTOMY     during second pregnancy    Family History  Problem Relation Age of Onset  . Cancer Mother   . Kidney disease Mother   . Alcohol abuse Mother   . Mental illness Mother   . Cancer Maternal Grandmother     Social History Reviewed with no changes to be made today.   Outpatient Medications Prior to Visit  Medication Sig Dispense Refill  . hydrOXYzine (ATARAX/VISTARIL) 10 MG tablet Take 1 tablet (10 mg total) by mouth 3 (three) times daily as needed. 90 tablet 2  . ibuprofen (ADVIL,MOTRIN) 600 MG tablet Take 1 tablet (600 mg total) by mouth every 6 (six) hours. 30 tablet 0  . lamoTRIgine (LAMICTAL) 25 MG tablet Take 25 mg by mouth daily.    . sodium chloride (OCEAN) 0.65 % SOLN nasal spray Place 1 spray into both nostrils as needed for congestion. Reported  on 06/02/2015    . ferrous sulfate (FERROUSUL) 325 (65 FE) MG tablet Take 1 tablet (325 mg total) by mouth 3 (three) times daily with meals. 30 tablet 3   No facility-administered medications prior to visit.     Allergies  Allergen Reactions  . Bee Venom Swelling  . Benadryl [Diphenhydramine] Swelling  . Codeine Itching  . Latex Rash       Objective:    BP 107/74 (BP Location: Right Arm, Patient Position: Sitting, Cuff Size: Normal)   Pulse 88   Temp 98.7 F (37.1 C) (Oral)   Ht 5' (1.524 m)   Wt 130 lb (59 kg)   SpO2 100%   BMI 25.39  kg/m  Wt Readings from Last 3 Encounters:  10/28/17 130 lb (59 kg)  10/09/17 130 lb (59 kg)  09/02/17 126 lb 12.8 oz (57.5 kg)    Physical Exam  Constitutional: She is oriented to person, place, and time. She appears well-developed and well-nourished. She is cooperative.  HENT:  Head: Normocephalic and atraumatic.  Eyes: Pupils are equal, round, and reactive to light. EOM and lids are normal. Right eye exhibits discharge (mild amount of yellow discharge and redness along lateral canthus of eye). Left eye exhibits no chemosis, no discharge, no exudate and no hordeolum. No foreign body present in the left eye. Right conjunctiva is injected. Right conjunctiva has no hemorrhage. Left conjunctiva is not injected. Left conjunctiva has no hemorrhage.  Neck: Normal range of motion.  Cardiovascular: Normal rate, regular rhythm and normal heart sounds. Exam reveals no gallop and no friction rub.  No murmur heard. Pulmonary/Chest: Effort normal and breath sounds normal. No tachypnea. No respiratory distress. She has no decreased breath sounds. She has no wheezes. She has no rhonchi. She has no rales. She exhibits no tenderness.  Abdominal: Bowel sounds are normal.  Musculoskeletal: Normal range of motion. She exhibits no edema.  Neurological: She is alert and oriented to person, place, and time. Coordination normal.  Skin: Skin is warm and dry.  Psychiatric: She has a normal mood and affect. Her behavior is normal. Judgment and thought content normal.  Nursing note and vitals reviewed.        Patient has been counseled extensively about nutrition and exercise as well as the importance of adherence with medications and regular follow-up. The patient was given clear instructions to go to ER or return to medical center if symptoms don't improve, worsen or new problems develop. The patient verbalized understanding.   Follow-up: Return if symptoms worsen or fail to improve.   Gildardo Pounds,  FNP-BC [facility] and [place], [place]   10/30/2017, 6:54 AM

## 2017-10-28 NOTE — Telephone Encounter (Signed)
Patient called because the [facility] pharmacy says they do not have the polysporin. Please follow up.

## 2017-10-28 NOTE — Telephone Encounter (Signed)
Kirsten with [facility] called to verify instructions regarding a medication because it was deleted out of their system. Please follow up.

## 2017-10-28 NOTE — Telephone Encounter (Signed)
Called and verified instructions with the pharmacy they should be working on it now.

## 2017-10-28 NOTE — Patient Instructions (Signed)
Bacterial Conjunctivitis Bacterial conjunctivitis is an infection of your conjunctiva. This is the clear membrane that covers the white part of your eye and the inner surface of your eyelid. This condition can make your eye:  Red or pink.  Itchy.  This condition is caused by bacteria. This condition spreads very easily from person to person (is contagious) and from one eye to the other eye. Follow these instructions at home: Medicines  Take or apply your antibiotic medicine as told by your doctor. Do not stop taking or applying the antibiotic even if you start to feel better.  Take or apply over-the-counter and prescription medicines only as told by your doctor.  Do not touch your eyelid with the eye drop bottle or the ointment tube. Managing discomfort  Wipe any fluid from your eye with a warm, wet washcloth or a cotton ball.  Place a cool, clean washcloth on your eye. Do this for 10-20 minutes, 3-4 times per day. General instructions  Do not wear contact lenses until the irritation is gone. Wear glasses until your doctor says it is okay to wear contacts.  Do not wear eye makeup until your symptoms are gone. Throw away any old makeup.  Change or wash your pillowcase every day.  Do not share towels or washcloths with anyone.  Wash your hands often with soap and water. Use paper towels to dry your hands.  Do not touch or rub your eyes.  Do not drive or use heavy machinery if your vision is blurry. Contact a doctor if:  You have a fever.  Your symptoms do not get better after 10 days. Get help right away if:  You have a fever and your symptoms suddenly get worse.  You have very bad pain when you move your eye.  Your face: ? Hurts. ? Is red. ? Is swollen.  You have sudden loss of vision. This information is not intended to replace advice given to you by your health care provider. Make sure you discuss any questions you have with your health care provider. Document  Released: 10/03/2007 Document Revised: 06/01/2015 Document Reviewed: 10/06/2014 Elsevier Interactive Patient Education  2018 [place] Foreign Body A foreign body is an object on or in the eye that should not be there. It could be a speck of dirt or dust, a hair, an eyelash, a splinter, or any other object. It can be on the outside of the eyeball (extraocular) or inside the eyeball. If the object is on the outside of the eyeball, it can usually be washed out or taken out by your doctor. An object inside the eyeball is an emergency, and it must be treated with surgery. Follow these instructions at home:  Take over-the-counter and prescription medicines only as told by your doctor. Use eye drops or ointment as told.  If you were prescribed antibiotic drops or ointment, use it as told by your doctor. Do not stop using it even if you start to feel better.  If you have a bandage on your eye (eye shield): ? Wear it as told. Follow instructions from your doctor about when to take it off. ? Do not drive or use heavy machinery while wearing the bandage.  If you do not have a bandage on your eye: ? Keep your eye closed as much as possible. ? Do not rub your eye. ? Wear dark glasses in bright light. ? Do not wear contact lenses until your eye feels normal, or as told  by your doctor. ? If you are doing activities with a high risk of eye injury, such as using high-speed tools, wear protective eye covering.  Keep all follow-up visits as told by your doctor. This is important. Contact a doctor if:  You have more pain in your eye.  You have problems with your eye bandage.  You have abnormal fluid (discharge) coming from your eye. Get help right away if:  Your ability to see (vision) gets worse.  You have more redness and swelling in or around your eye. Summary  A foreign body is an object on or in the eye that should not be there.  An object on the outside of the eyeball can usually  be washed out or taken out by your doctor. An object inside the eyeball is an emergency.  If you have a bandage on your eye (eye shield), do not drive while wearing it.  If you have more pain in your eye, contact your doctor. This information is not intended to replace advice given to you by your health care provider. Make sure you discuss any questions you have with your health care provider. Document Released: 06/13/2009 Document Revised: 01/10/2016 Document Reviewed: 01/10/2016 Elsevier Interactive Patient Education  2017 [employer].

## 2017-10-28 NOTE — Telephone Encounter (Signed)
I called and verified the prescription with the pharmacy, they should be filling it shortly. Typically they notify the patient via automated call or text when prescriptions are ready.

## 2017-10-30 ENCOUNTER — Encounter: Payer: Self-pay | Admitting: Nurse Practitioner

## 2018-04-14 ENCOUNTER — Other Ambulatory Visit: Payer: Self-pay

## 2018-04-14 ENCOUNTER — Ambulatory Visit: Payer: BLUE CROSS/BLUE SHIELD | Attending: Nurse Practitioner | Admitting: Nurse Practitioner

## 2018-04-14 ENCOUNTER — Encounter: Payer: Self-pay | Admitting: Nurse Practitioner

## 2018-04-14 DIAGNOSIS — K219 Gastro-esophageal reflux disease without esophagitis: Secondary | ICD-10-CM

## 2018-04-14 MED ORDER — OMEPRAZOLE 10 MG PO CPDR: DELAYED_RELEASE_CAPSULE | ORAL | 1 refills | Status: DC

## 2018-04-14 MED ORDER — SIMETHICONE 80 MG PO CHEW: 80.0000 mg | CHEWABLE_TABLET | Freq: Four times a day (QID) | ORAL | 1 refills | Status: DC | PRN

## 2018-04-14 NOTE — Progress Notes (Signed)
Assessment & Plan:  Marta was seen today for abdominal pain.  Diagnoses and all orders for this visit:  Gastroesophageal reflux disease, esophagitis presence not specified -     omeprazole (PRILOSEC) 10 MG capsule; Take 1 (one) tablet by mouth 30 minutes prior to breakfast -     simethicone (MYLICON) 80 MG chewable tablet; Chew 1 tablet (80 mg total) by mouth every 6 (six) hours as needed for flatulence. INSTRUCTIONS: Avoid GERD Triggers: acidic, spicy or fried foods, caffeine, coffee, sodas,  alcohol and chocolate.   Patient has been counseled on age-appropriate routine health concerns for screening and prevention. These are reviewed and up-to-date. Referrals have been placed accordingly. Immunizations are up-to-date or declined.    Subjective:   Chief Complaint  Patient presents with  . Abdominal Pain    Pt. stated feel like something is trap in her. Pt. stated last bowl movement was this morning, and able to [place] gass.    HPI Roniya Tetro Erhard 33 y.o. female presents for telehealth with complaints of GERD symptoms.  GERD Patient complains of symptoms of heartburn. This has been associated with belching, bilious reflux, heartburn, nocturnal burning and upper abdominal discomfort.  She denies choking on food, cough, deep pressure at base of neck, difficulty swallowing, hematemesis, melena, nausea, regurgitation of undigested food and shortness of breath. Symptoms have been present for several weeks. She denies dysphagia.  She has had a weight loss of 8 pounds over a period of 5 months. She denies melena, hematochezia, hematemesis, and coffee ground emesis. Medical therapy in the past has included antacids. Endorses normal bowel movements.     Review of Systems  Constitutional: Negative for fever, malaise/fatigue and weight loss.  HENT: Negative.  Negative for nosebleeds.   Eyes: Negative.  Negative for blurred vision, double vision and photophobia.  Respiratory: Negative.  Negative  for cough and shortness of breath.   Cardiovascular: Negative.  Negative for chest pain, palpitations and leg swelling.  Gastrointestinal: Positive for heartburn. Negative for abdominal pain, blood in stool, constipation, diarrhea, melena, nausea and vomiting.  Musculoskeletal: Negative.  Negative for myalgias.  Neurological: Negative.  Negative for dizziness, focal weakness, seizures and headaches.  Psychiatric/Behavioral: Negative.  Negative for suicidal ideas.    Past Medical History:  Diagnosis Date  . Abnormal Pap smear   . Anemia   . Bipolar 1 disorder ([place])   . Blood dyscrasia    Sickle Cell Trait  . Depression   . Mental disorder    bipolar, schizophrenia  . Schizophrenia ([place])   . Scoliosis   . Sickle cell trait [facility])     Past Surgical History:  Procedure Laterality Date  . FEMORAL OSTEOTOMY W/ RODDING  2004   s/p accident , leg fx  . LAPAROSCOPIC CHOLECYSTECTOMY     during second pregnancy    Family History  Problem Relation Age of Onset  . Cancer Mother   . Kidney disease Mother   . Alcohol abuse Mother   . Mental illness Mother   . Cancer Maternal Grandmother     Social History Reviewed with no changes to be made today.   Outpatient Medications Prior to Visit  Medication Sig Dispense Refill  . hydrOXYzine (ATARAX/VISTARIL) 10 MG tablet Take 1 tablet (10 mg total) by mouth 3 (three) times daily as needed. 90 tablet 2  . lamoTRIgine (LAMICTAL) 25 MG tablet Take 25 mg by mouth daily.    . ferrous sulfate (FERROUSUL) 325 (65 FE) MG tablet Take 1  tablet (325 mg total) by mouth 3 (three) times daily with meals. 30 tablet 3  . sodium chloride (OCEAN) 0.65 % SOLN nasal spray Place 1 spray into both nostrils as needed for congestion. Reported on 06/02/2015    . ibuprofen (ADVIL,MOTRIN) 600 MG tablet Take 1 tablet (600 mg total) by mouth every 6 (six) hours. (Patient not taking: Reported on 04/14/2018) 30 tablet 0   No facility-administered medications prior to visit.      Allergies  Allergen Reactions  . Bee Venom Swelling  . Benadryl [Diphenhydramine] Swelling  . Codeine Itching  . Latex Rash       Objective:    There were no vitals taken for this visit. Wt Readings from Last 3 Encounters:  10/28/17 130 lb (59 kg)  10/09/17 130 lb (59 kg)  09/02/17 126 lb 12.8 oz (57.5 kg)       Patient has been counseled extensively about nutrition and exercise as well as the importance of adherence with medications and regular follow-up. The patient was given clear instructions to go to ER or return to medical center if symptoms don't improve, worsen or new problems develop. The patient verbalized understanding.   Follow-up: Return if symptoms worsen or fail to improve.   Gildardo Pounds, FNP-BC [facility] and [place], [place]   04/14/2018, 3:05 PM

## 2018-04-15 ENCOUNTER — Encounter: Payer: Self-pay | Admitting: Nurse Practitioner

## 2018-04-20 ENCOUNTER — Other Ambulatory Visit: Payer: Self-pay | Admitting: Nurse Practitioner

## 2018-04-20 ENCOUNTER — Encounter: Payer: Self-pay | Admitting: Nurse Practitioner

## 2018-04-20 DIAGNOSIS — K219 Gastro-esophageal reflux disease without esophagitis: Secondary | ICD-10-CM

## 2018-04-21 ENCOUNTER — Encounter: Payer: Self-pay | Admitting: Nurse Practitioner

## 2018-04-23 ENCOUNTER — Encounter: Payer: Self-pay | Admitting: Gastroenterology

## 2018-04-23 ENCOUNTER — Other Ambulatory Visit: Payer: Self-pay

## 2018-04-23 ENCOUNTER — Telehealth: Payer: BLUE CROSS/BLUE SHIELD | Admitting: Gastroenterology

## 2018-04-23 VITALS — Ht 60.0 in | Wt 121.0 lb

## 2018-04-23 DIAGNOSIS — K219 Gastro-esophageal reflux disease without esophagitis: Secondary | ICD-10-CM

## 2018-04-23 DIAGNOSIS — R1013 Epigastric pain: Secondary | ICD-10-CM

## 2018-04-23 DIAGNOSIS — Z9049 Acquired absence of other specified parts of digestive tract: Secondary | ICD-10-CM

## 2018-04-23 NOTE — Patient Instructions (Signed)
  EGD once COVID-19 scare is over. Likely June 2020.  Continue current meds.  No more fleets.   She is to call us if he starts having any problems before   Thank You for choosing [facility]  Dr Lyndel Safe

## 2018-04-23 NOTE — Progress Notes (Signed)
Chief Complaint:   Referring Provider:  Gildardo Pounds, NP      ASSESSMENT AND PLAN;   #1. GERD (didn't tolerate omeprazole)  #2. Epigastric pain/chest pain- resolved. S/P lap cholein past, Nl LFTs. H/O weight loss is concerning.   Plan: - EGD. She would like it done once COVID-19 scare is over. Likely June 2020. - Continue current meds. - No more fleets. - She is to call us if he starts having any problems until then.   HPI:    Jennifer Barber is a 33 y.o. female  With longstanding history of intermittent heartburn Was more prominent during pregnancies With associated epigastric discomfort and occasional chest pains. Got worse after she took fleets laxative for constipation. Resolved now  Was given a trial of omeprazole 10 mg.  However, she could not tolerate omeprazole.  Made her feel dizzy.  Currently feels much better. No nausea, vomiting, heartburn, regurgitation, odynophagia or dysphagia.  No significant diarrhea or constipation.  There is no melena or hematochezia.  Has lost 8 pounds over last 3 to 4 weeks.  Her weight does fluctuate.  Had cholecystectomy in second trimester, first pregnancy.  No jaundice dark urine or pale stools.  No history of alcohol use. No sodas, chocolates, chewing gums and candy.    Past Medical History:  Diagnosis Date  . Abnormal Pap smear   . Anemia   . Bipolar 1 disorder ([place])   . Blood dyscrasia    Sickle Cell Trait  . Depression   . Mental disorder    bipolar, schizophrenia  . Schizophrenia ([place])   . Scoliosis   . Sickle cell trait [facility])     Past Surgical History:  Procedure Laterality Date  . FEMORAL OSTEOTOMY W/ RODDING  2004   s/p accident , leg fx  . LAPAROSCOPIC CHOLECYSTECTOMY     during second pregnancy    Family History  Problem Relation Age of Onset  . Cancer Mother   . Kidney disease Mother   . Alcohol abuse Mother   . Mental illness Mother   . Cancer Maternal Grandmother     Social History    Tobacco Use  . Smoking status: Former Smoker    Packs/day: 0.25    Years: 11.00    Pack years: 2.75    Types: E-cigarettes  . Smokeless tobacco: Never Used  Substance Use Topics  . Alcohol use: No    Comment: pt denies smoking, drinking or drug use  . Drug use: No    Types: Marijuana    Comment: Pt had + UDS during pregnancy; Pt denied during admission history     Current Outpatient Medications  Medication Sig Dispense Refill  . hydrOXYzine (ATARAX/VISTARIL) 10 MG tablet Take 1 tablet (10 mg total) by mouth 3 (three) times daily as needed. 90 tablet 2  . lamoTRIgine (LAMICTAL) 25 MG tablet Take 25 mg by mouth daily.    . simethicone (MYLICON) 80 MG chewable tablet Chew 1 tablet (80 mg total) by mouth every 6 (six) hours as needed for flatulence. 30 tablet 1  . sodium chloride (OCEAN) 0.65 % SOLN nasal spray Place 1 spray into both nostrils as needed for congestion. Reported on 06/02/2015    . ferrous sulfate (FERROUSUL) 325 (65 FE) MG tablet Take 1 tablet (325 mg total) by mouth 3 (three) times daily with meals. 30 tablet 3   No current facility-administered medications for this visit.     Allergies  Allergen Reactions  .  Bee Venom Swelling  . Benadryl [Diphenhydramine] Swelling  . Codeine Itching  . Latex Rash    Review of Systems:  Constitutional: Denies fever, chills, diaphoresis, appetite change and fatigue.  HEENT: Denies photophobia, eye pain, redness, hearing loss, ear pain, congestion, sore throat, rhinorrhea, sneezing, mouth sores, neck pain, neck stiffness and tinnitus.   Respiratory: Denies SOB, DOE, cough, chest tightness,  and wheezing.   Cardiovascular: Denies chest pain, palpitations and leg swelling.  Genitourinary: Denies dysuria, urgency, frequency, hematuria, flank pain and difficulty urinating.  Musculoskeletal: Denies myalgias, back pain, joint swelling, arthralgias and gait problem.  Skin: No rash.  Neurological: Denies dizziness, seizures, syncope,  weakness, light-headedness, numbness and headaches.  Hematological: Denies adenopathy. Easy bruising, personal or family bleeding history  Psychiatric/Behavioral:  Has anxiety or depression     Physical Exam:    Ht 5' (1.524 m)   Wt 121 lb (54.9 kg)   BMI 23.63 kg/m  Filed Weights   04/23/18 0953  Weight: 121 lb (54.9 kg)     Data Reviewed: I have personally reviewed following labs and imaging studies  CBC: CBC Latest Ref Rng & Units 10/09/2016 03/12/2016 05/08/2015  WBC 3.4 - 10.8 x10E3/uL 7.1 7.1 6.6  Hemoglobin 11.1 - 15.9 g/dL 12.9 13.3 13.5  Hematocrit 34.0 - 46.6 % 40.8 41.3 41.6  Platelets 150 - 379 x10E3/uL 267 277 225    CMP: CMP Latest Ref Rng & Units 10/10/2017 03/12/2016 05/08/2015  Glucose 65 - 99 mg/dL 74 77 91  BUN 6 - 20 mg/dL 11 11 9   Creatinine 0.57 - 1.00 mg/dL 0.83 0.98 0.80  Sodium 134 - 144 mmol/L 140 139 142  Potassium 3.5 - 5.2 mmol/L 4.1 3.8 4.4  Chloride 96 - 106 mmol/L 106 106 107  CO2 20 - 29 mmol/L 22 22 24   Calcium 8.7 - 10.2 mg/dL 9.4 9.4 9.5  Total Protein 6.0 - 8.5 g/dL 6.8 7.0 -  Total Bilirubin 0.0 - 1.2 mg/dL 0.4 0.3 -  Alkaline Phos 39 - 117 IU/L 39 36 -  AST 0 - 40 IU/L 14 15 -  ALT 0 - 32 IU/L 10 11 -   This service was provided via telemedicine.  The patient was located at home.  The provider was located in office.  The patient did consent to this tele-visit (Zoom) and is aware of possible charges through their insurance for this visit.   Time spent on call and coordination of care: 20 min    Carmell Austria, MD 04/23/2018, 10:55 AM  Cc: Gildardo Pounds, NP

## 2018-05-07 ENCOUNTER — Other Ambulatory Visit: Payer: Self-pay | Admitting: Nurse Practitioner

## 2018-05-07 DIAGNOSIS — K219 Gastro-esophageal reflux disease without esophagitis: Secondary | ICD-10-CM

## 2018-07-23 DIAGNOSIS — R0789 Other chest pain: Secondary | ICD-10-CM | POA: Diagnosis not present

## 2018-08-24 ENCOUNTER — Encounter: Payer: Self-pay | Admitting: Family Medicine

## 2018-08-24 ENCOUNTER — Ambulatory Visit (HOSPITAL_BASED_OUTPATIENT_CLINIC_OR_DEPARTMENT_OTHER): Payer: BC Managed Care – PPO | Admitting: Family Medicine

## 2018-08-24 ENCOUNTER — Other Ambulatory Visit: Payer: Self-pay

## 2018-08-24 ENCOUNTER — Other Ambulatory Visit (HOSPITAL_COMMUNITY)
Admission: RE | Admit: 2018-08-24 | Discharge: 2018-08-24 | Disposition: A | Discharge status: Home / Self Care | Payer: BC Managed Care – PPO | Source: Ambulatory Visit | Attending: Family Medicine | Admitting: Family Medicine

## 2018-08-24 VITALS — BP 121/75 | HR 81 | Temp 98.0°F | Ht 60.0 in | Wt 131.0 lb

## 2018-08-24 DIAGNOSIS — Z13228 Encounter for screening for other metabolic disorders: Secondary | ICD-10-CM | POA: Diagnosis not present

## 2018-08-24 DIAGNOSIS — B373 Candidiasis of vulva and vagina: Secondary | ICD-10-CM

## 2018-08-24 DIAGNOSIS — Z803 Family history of malignant neoplasm of breast: Secondary | ICD-10-CM

## 2018-08-24 DIAGNOSIS — B3731 Acute candidiasis of vulva and vagina: Secondary | ICD-10-CM

## 2018-08-24 DIAGNOSIS — Z0001 Encounter for general adult medical examination with abnormal findings: Secondary | ICD-10-CM | POA: Diagnosis not present

## 2018-08-24 DIAGNOSIS — Z0000 Encounter for general adult medical examination without abnormal findings: Secondary | ICD-10-CM | POA: Diagnosis not present

## 2018-08-24 DIAGNOSIS — Z113 Encounter for screening for infections with a predominantly sexual mode of transmission: Secondary | ICD-10-CM | POA: Diagnosis not present

## 2018-08-24 MED ORDER — FLUCONAZOLE 150 MG PO TABS: 150.0000 mg | ORAL_TABLET | Freq: Once | ORAL | 0 refills | Status: AC

## 2018-08-24 MED ORDER — CLOTRIMAZOLE 1 % EX CREA: 1.0000 "application " | TOPICAL_CREAM | Freq: Two times a day (BID) | CUTANEOUS | 0 refills | Status: DC

## 2018-08-24 NOTE — Patient Instructions (Signed)
Health Maintenance, Female Adopting a healthy lifestyle and getting preventive care are important in promoting health and wellness. Ask your health care provider about:  The right schedule for you to have regular tests and exams.  Things you can do on your own to prevent diseases and keep yourself healthy. What should I know about diet, weight, and exercise? Eat a healthy diet   Eat a diet that includes plenty of vegetables, fruits, low-fat dairy products, and lean protein.  Do not eat a lot of foods that are high in solid fats, added sugars, or sodium. Maintain a healthy weight Body mass index (BMI) is used to identify weight problems. It estimates body fat based on height and weight. Your health care provider can help determine your BMI and help you achieve or maintain a healthy weight. Get regular exercise Get regular exercise. This is one of the most important things you can do for your health. Most adults should:  Exercise for at least 150 minutes each week. The exercise should increase your heart rate and make you sweat (moderate-intensity exercise).  Do strengthening exercises at least twice a week. This is in addition to the moderate-intensity exercise.  Spend less time sitting. Even light physical activity can be beneficial. Watch cholesterol and blood lipids Have your blood tested for lipids and cholesterol at 34 years of age, then have this test every 5 years. Have your cholesterol levels checked more often if:  Your lipid or cholesterol levels are high.  You are older than 34 years of age.  You are at high risk for heart disease. What should I know about cancer screening? Depending on your health history and family history, you may need to have cancer screening at various ages. This may include screening for:  Breast cancer.  Cervical cancer.  Colorectal cancer.  Skin cancer.  Lung cancer. What should I know about heart disease, diabetes, and high blood  pressure? Blood pressure and heart disease  High blood pressure causes heart disease and increases the risk of stroke. This is more likely to develop in people who have high blood pressure readings, are of African descent, or are overweight.  Have your blood pressure checked: ? Every 3-5 years if you are 18-39 years of age. ? Every year if you are 40 years old or older. Diabetes Have regular diabetes screenings. This checks your fasting blood sugar level. Have the screening done:  Once every three years after age 40 if you are at a normal weight and have a low risk for diabetes.  More often and at a younger age if you are overweight or have a high risk for diabetes. What should I know about preventing infection? Hepatitis B If you have a higher risk for hepatitis B, you should be screened for this virus. Talk with your health care provider to find out if you are at risk for hepatitis B infection. Hepatitis C Testing is recommended for:  Everyone born from 1945 through 1965.  Anyone with known risk factors for hepatitis C. Sexually transmitted infections (STIs)  Get screened for STIs, including gonorrhea and chlamydia, if: ? You are sexually active and are younger than 34 years of age. ? You are older than 34 years of age and your health care provider tells you that you are at risk for this type of infection. ? Your sexual activity has changed since you were last screened, and you are at increased risk for chlamydia or gonorrhea. Ask your health care provider if   you are at risk.  Ask your health care provider about whether you are at high risk for HIV. Your health care provider may recommend a prescription medicine to help prevent HIV infection. If you choose to take medicine to prevent HIV, you should first get tested for HIV. You should then be tested every 3 months for as long as you are taking the medicine. Pregnancy  If you are about to stop having your period (premenopausal) and  you may become pregnant, seek counseling before you get pregnant.  Take 400 to 800 micrograms (mcg) of folic acid every day if you become pregnant.  Ask for birth control (contraception) if you want to prevent pregnancy. Osteoporosis and menopause Osteoporosis is a disease in which the bones lose minerals and strength with aging. This can result in bone fractures. If you are 65 years old or older, or if you are at risk for osteoporosis and fractures, ask your health care provider if you should:  Be screened for bone loss.  Take a calcium or vitamin D supplement to lower your risk of fractures.  Be given hormone replacement therapy (HRT) to treat symptoms of menopause. Follow these instructions at home: Lifestyle  Do not use any products that contain nicotine or tobacco, such as cigarettes, e-cigarettes, and chewing tobacco. If you need help quitting, ask your health care provider.  Do not use street drugs.  Do not share needles.  Ask your health care provider for help if you need support or information about quitting drugs. Alcohol use  Do not drink alcohol if: ? Your health care provider tells you not to drink. ? You are pregnant, may be pregnant, or are planning to become pregnant.  If you drink alcohol: ? Limit how much you use to 0-1 drink a day. ? Limit intake if you are breastfeeding.  Be aware of how much alcohol is in your drink. In the [place] one drink equals one 12 oz bottle of beer (355 mL), one 5 oz glass of wine (148 mL), or one 1 oz glass of hard liquor (44 mL). General instructions  Schedule regular health, dental, and eye exams.  Stay current with your vaccines.  Tell your health care provider if: ? You often feel depressed. ? You have ever been abused or do not feel safe at home. Summary  Adopting a healthy lifestyle and getting preventive care are important in promoting health and wellness.  Follow your health care provider's instructions about healthy  diet, exercising, and getting tested or screened for diseases.  Follow your health care provider's instructions on monitoring your cholesterol and blood pressure. This information is not intended to replace advice given to you by your health care provider. Make sure you discuss any questions you have with your health care provider. Document Released: 07/09/2010 Document Revised: 12/17/2017 Document Reviewed: 12/17/2017 Elsevier Patient Education  2020 [employer]  

## 2018-08-24 NOTE — Progress Notes (Signed)
Subjective:  Patient ID: Jennifer Barber, female    DOB: 05-Nov-1985  Age: 33 y.o. MRN: [id number]  CC: Annual Exam   HPI COLIN NORMENT presents for complete physical exam.  She thinks she has a yeast infection as she has experienced vaginal itching. Last Pap smear was in 11/2016 and was normal. She has a family history of breast cancer in her mother who was diagnosed before the age of 48 and in 70 of  her maternal aunts.  Past Medical History:  Diagnosis Date  . Abnormal Pap smear   . Anemia   . Bipolar 1 disorder ([place])   . Blood dyscrasia    Sickle Cell Trait  . Depression   . Mental disorder    bipolar, schizophrenia  . Schizophrenia ([place])   . Scoliosis   . Sickle cell trait [facility])     Past Surgical History:  Procedure Laterality Date  . FEMORAL OSTEOTOMY W/ RODDING  2004   s/p accident , leg fx  . LAPAROSCOPIC CHOLECYSTECTOMY     during second pregnancy    Family History  Problem Relation Age of Onset  . Cancer Mother   . Kidney disease Mother   . Alcohol abuse Mother   . Mental illness Mother   . Cancer Maternal Grandmother     Allergies  Allergen Reactions  . Bee Venom Swelling  . Benadryl [Diphenhydramine] Swelling  . Codeine Itching  . Latex Rash    Outpatient Medications Prior to Visit  Medication Sig Dispense Refill  . hydrOXYzine (ATARAX/VISTARIL) 10 MG tablet Take 1 tablet (10 mg total) by mouth 3 (three) times daily as needed. 90 tablet 2  . lamoTRIgine (LAMICTAL) 25 MG tablet Take 25 mg by mouth daily.    . ferrous sulfate (FERROUSUL) 325 (65 FE) MG tablet Take 1 tablet (325 mg total) by mouth 3 (three) times daily with meals. 30 tablet 3  . simethicone (MYLICON) 80 MG chewable tablet Chew 1 tablet (80 mg total) by mouth every 6 (six) hours as needed for flatulence. (Patient not taking: Reported on 08/24/2018) 30 tablet 1  . sodium chloride (OCEAN) 0.65 % SOLN nasal spray Place 1 spray into both nostrils as needed for congestion. Reported on  06/02/2015     No facility-administered medications prior to visit.      ROS Review of Systems  Constitutional: Negative for activity change, appetite change and fatigue.  HENT: Negative for congestion, sinus pressure and sore throat.   Eyes: Negative for visual disturbance.  Respiratory: Negative for cough, chest tightness, shortness of breath and wheezing.   Cardiovascular: Negative for chest pain and palpitations.  Gastrointestinal: Negative for abdominal distention, abdominal pain and constipation.  Endocrine: Negative for polydipsia.  Genitourinary: Negative for dysuria and frequency.  Musculoskeletal: Negative for arthralgias and back pain.  Skin: Negative for rash.  Neurological: Negative for tremors, light-headedness and numbness.  Hematological: Does not bruise/bleed easily.  Psychiatric/Behavioral: Negative for agitation and behavioral problems.    Objective:  BP 121/75   Pulse 81   Temp 98 F (36.7 C) (Oral)   Ht 5' (1.524 m)   Wt 131 lb (59.4 kg)   SpO2 100%   BMI 25.58 kg/m   BP/Weight 08/24/2018 04/23/2018 30/13/1438  Systolic BP 887 - 579  Diastolic BP 75 - 74  Wt. (Lbs) 131 121 130  BMI 25.58 23.63 25.39      Physical Exam Constitutional:      General: She is not in acute distress.  Appearance: She is well-developed. She is not diaphoretic.  HENT:     Head: Normocephalic.     Right Ear: External ear normal.     Left Ear: External ear normal.     Nose: Nose normal.  Eyes:     Conjunctiva/sclera: Conjunctivae normal.     Pupils: Pupils are equal, round, and reactive to light.  Neck:     Musculoskeletal: Normal range of motion.     Vascular: No JVD.  Cardiovascular:     Rate and Rhythm: Normal rate and regular rhythm.     Heart sounds: Normal heart sounds. No murmur. No gallop.   Pulmonary:     Effort: Pulmonary effort is normal. No respiratory distress.     Breath sounds: Normal breath sounds. No wheezing or rales.  Chest:     Chest wall:  No tenderness.  Abdominal:     General: Bowel sounds are normal. There is no distension.     Palpations: Abdomen is soft. There is no mass.     Tenderness: There is no abdominal tenderness.  Genitourinary:    Comments: Normal external genitalia.  Vaginal with whitish cheesy discharge Musculoskeletal: Normal range of motion.        General: No tenderness.  Skin:    General: Skin is warm and dry.  Neurological:     Mental Status: She is alert and oriented to person, place, and time.     Deep Tendon Reflexes: Reflexes are normal and symmetric.     CMP Latest Ref Rng & Units 10/10/2017 03/12/2016 05/08/2015  Glucose 65 - 99 mg/dL 74 77 91  BUN 6 - 20 mg/dL _0 Creatinine 0.57 - 1.00 mg/dL 0.83 0.98 0.80  Sodium 134 - 144 mmol/L 140 139 142  Potassium 3.5 - 5.2 mmol/L 4.1 3.8 4.4  Chloride 96 - 106 mmol/L 106 106 107  CO2 20 - 29 mmol/L _1 Calcium 8.7 - 10.2 mg/dL 9.4 9.4 9.5  Total Protein 6.0 - 8.5 g/dL 6.8 7.0 -  Total Bilirubin 0.0 - 1.2 mg/dL 0.4 0.3 -  Alkaline Phos 39 - 117 IU/L 39 36 -  AST 0 - 40 IU/L 14 15 -  ALT 0 - 32 IU/L 10 11 -    Lipid Panel     Component Value Date/Time   CHOL 125 10/10/2017 0900   TRIG 36 10/10/2017 0900   HDL 45 10/10/2017 0900   CHOLHDL 2.8 10/10/2017 0900   CHOLHDL 3.0 05/08/2015 1132   VLDL 7 05/08/2015 1132   LDLCALC 73 10/10/2017 0900    CBC    Component Value Date/Time   WBC 7.1 10/09/2016 1400   WBC 7.1 03/12/2016 0943   RBC 4.78 10/09/2016 1400   RBC 4.86 03/12/2016 0943   HGB 12.9 10/09/2016 1400   HCT 40.8 10/09/2016 1400   PLT 267 10/09/2016 1400   MCV 85 10/09/2016 1400   MCH 27.0 10/09/2016 1400   MCH 27.4 03/12/2016 0943   MCHC 31.6 10/09/2016 1400   MCHC 32.2 03/12/2016 0943   RDW 13.8 10/09/2016 1400   LYMPHSABS 3.1 10/09/2016 1400   MONOABS 330 05/08/2015 1132   EOSABS 0.1 10/09/2016 1400   BASOSABS 0.1 10/09/2016 1400    Lab Results  Component Value Date   HGBA1C 5.0 03/12/2016     Assessment & Plan:   1. Annual physical exam Counseled on 150 minutes of exercise per week, healthy eating (including decreased daily intake of saturated fats, cholesterol, added  sugars, sodium), STI prevention, routine healthcare maintenance. - CBC with Differential/Platelet  2. Screening for STD (sexually transmitted disease) - HSV Type I/II IgG, IgMw/ reflex - HIV Antibody (routine testing w rflx)  3. Screening for metabolic disorder - [id number] - T4, free - TSH  4. Vaginal candidiasis - Cervicovaginal ancillary only - fluconazole (DIFLUCAN) 150 MG tablet; Take 1 tablet (150 mg total) by mouth once for 1 dose.  Dispense: 1 tablet; Refill: 0 - clotrimazole (LOTRIMIN) 1 % cream; Apply 1 application topically 2 (two) times daily.  Dispense: 30 g; Refill: 0   5 Family History of Breast Cancer Family history of breast cancer in mother less than 23 years and 4 maternal aunts -Screening mammogram ordered Meds ordered this encounter  Medications  . fluconazole (DIFLUCAN) 150 MG tablet    Sig: Take 1 tablet (150 mg total) by mouth once for 1 dose.    Dispense:  1 tablet    Refill:  0  . clotrimazole (LOTRIMIN) 1 % cream    Sig: Apply 1 application topically 2 (two) times daily.    Dispense:  30 g    Refill:  0    Follow-up: Return in about 1 year (around 08/24/2019) for complete physical exam.       Charlott Rakes, MD, FAAFP. [facility] and [facility] [place], [place]   08/24/2018, 12:08 PM

## 2018-08-24 NOTE — Progress Notes (Signed)
Patient states that she may have a yeast infection.

## 2018-08-26 LAB — CBC WITH DIFFERENTIAL/PLATELET
Basophils Absolute: 0.1 10*3/uL (ref 0.0–0.2)
Basos: 1 %
EOS (ABSOLUTE): 0.2 10*3/uL (ref 0.0–0.4)
Eos: 2 %
Hematocrit: 44.9 % (ref 34.0–46.6)
Hemoglobin: 13.6 g/dL (ref 11.1–15.9)
Immature Grans (Abs): 0 10*3/uL (ref 0.0–0.1)
Immature Granulocytes: 0 %
Lymphocytes Absolute: 3.2 10*3/uL — ABNORMAL HIGH (ref 0.7–3.1)
Lymphs: 43 %
MCH: 27 pg (ref 26.6–33.0)
MCHC: 30.3 g/dL — ABNORMAL LOW (ref 31.5–35.7)
MCV: 89 fL (ref 79–97)
Monocytes Absolute: 0.4 10*3/uL (ref 0.1–0.9)
Monocytes: 5 %
Neutrophils Absolute: 3.7 10*3/uL (ref 1.4–7.0)
Neutrophils: 49 %
Platelets: 260 10*3/uL (ref 150–450)
RBC: 5.03 x10E6/uL (ref 3.77–5.28)
RDW: 12.7 % (ref 11.7–15.4)
WBC: 7.5 10*3/uL (ref 3.4–10.8)

## 2018-08-26 LAB — CMP14+EGFR
ALT: 13 IU/L (ref 0–32)
AST: 11 IU/L (ref 0–40)
Albumin/Globulin Ratio: 1.8 (ref 1.2–2.2)
Albumin: 4.5 g/dL (ref 3.8–4.8)
Alkaline Phosphatase: 39 IU/L (ref 39–117)
BUN/Creatinine Ratio: 12 (ref 9–23)
BUN: 10 mg/dL (ref 6–20)
Bilirubin Total: 0.3 mg/dL (ref 0.0–1.2)
CO2: 26 mmol/L (ref 20–29)
Calcium: 9.6 mg/dL (ref 8.7–10.2)
Chloride: 106 mmol/L (ref 96–106)
Creatinine, Ser: 0.84 mg/dL (ref 0.57–1.00)
GFR calc Af Amer: 105 mL/min/{1.73_m2} (ref >59)
GFR calc non Af Amer: 91 mL/min/{1.73_m2} (ref >59)
Globulin, Total: 2.5 g/dL (ref 1.5–4.5)
Glucose: 75 mg/dL (ref 65–99)
Potassium: 4 mmol/L (ref 3.5–5.2)
Sodium: 143 mmol/L (ref 134–144)
Total Protein: 7 g/dL (ref 6.0–8.5)

## 2018-08-26 LAB — T4, FREE: Free T4: 0.96 ng/dL (ref 0.82–1.77)

## 2018-08-26 LAB — HSV TYPE I/II IGG, IGMW/ REFLEX
HSV 1 Glycoprotein G Ab, IgG: <0.91 index (ref 0.00–0.90)
HSV 1 IgM: <1:10 {titer}
HSV 2 IgG, Type Spec: 9.66 index — ABNORMAL HIGH (ref 0.00–0.90)
HSV 2 IgM: <1:10 {titer}

## 2018-08-26 LAB — TSH: TSH: 1.7 u[IU]/mL (ref 0.450–4.500)

## 2018-08-26 LAB — HIV ANTIBODY (ROUTINE TESTING W REFLEX): HIV Screen 4th Generation wRfx: NONREACTIVE

## 2018-08-26 LAB — CERVICOVAGINAL ANCILLARY ONLY
Bacterial vaginitis: NEGATIVE
Candida vaginitis: POSITIVE — AB
Chlamydia: NEGATIVE
Neisseria Gonorrhea: NEGATIVE
Trichomonas: NEGATIVE

## 2018-08-27 ENCOUNTER — Telehealth: Payer: Self-pay | Admitting: Genetic Counselor

## 2018-08-27 NOTE — Telephone Encounter (Signed)
Received a genetic counseling referral from Dr. Margarita Rana for fhx of breast cancer. Jennifer Barber returned my call and has been scheduled to see Clint Guy on [phone] at [id number] for a webex visit. Jennifer Barber is aware someone will be call her to setup the webex visit.

## 2018-08-28 ENCOUNTER — Telehealth: Payer: Self-pay | Admitting: Family Medicine

## 2018-08-28 ENCOUNTER — Telehealth: Payer: Self-pay | Admitting: Genetic Counselor

## 2018-08-28 NOTE — Telephone Encounter (Signed)
Called patient regarding upcoming Webex appointment, patient is notified and e-mail has been sent. °

## 2018-08-28 NOTE — Telephone Encounter (Signed)
Pt would like to know lab results, she cant get into mychart..please follow up

## 2018-08-28 NOTE — Telephone Encounter (Signed)
Patient was called and patient states that she has gone over results with PCP already.

## 2018-08-31 ENCOUNTER — Telehealth: Payer: Self-pay | Admitting: Genetic Counselor

## 2018-09-01 ENCOUNTER — Inpatient Hospital Stay: Payer: BC Managed Care – PPO | Admitting: Genetic Counselor

## 2018-09-22 ENCOUNTER — Telehealth: Payer: Self-pay | Admitting: Genetic Counselor

## 2018-09-22 NOTE — Telephone Encounter (Signed)
Called patient regarding upcoming Webex appointment, per patient's request appointment has been cancelled.

## 2018-09-23 ENCOUNTER — Inpatient Hospital Stay: Payer: BC Managed Care – PPO | Admitting: Genetic Counselor

## 2018-10-14 ENCOUNTER — Ambulatory Visit: Payer: BC Managed Care – PPO

## 2018-11-05 ENCOUNTER — Telehealth: Payer: Self-pay | Admitting: Family Medicine

## 2018-11-05 NOTE — Telephone Encounter (Signed)
Please schedule for an appointment and if none available refer to UC

## 2018-11-05 NOTE — Telephone Encounter (Signed)
Patient called stating she is not feeling well states she has been feeling  Stomach pain and has been spacing out. Please follow up.   Stomach pain Spacing out

## 2019-03-25 ENCOUNTER — Ambulatory Visit: Payer: BC Managed Care – PPO

## 2019-03-29 ENCOUNTER — Other Ambulatory Visit: Payer: Self-pay

## 2019-03-29 ENCOUNTER — Encounter: Payer: Self-pay | Admitting: Family Medicine

## 2019-03-29 ENCOUNTER — Ambulatory Visit: Payer: Self-pay | Attending: Family Medicine | Admitting: Family Medicine

## 2019-03-29 VITALS — BP 122/77 | HR 86 | Ht 60.0 in | Wt 138.0 lb

## 2019-03-29 DIAGNOSIS — B9681 Helicobacter pylori [H. pylori] as the cause of diseases classified elsewhere: Secondary | ICD-10-CM

## 2019-03-29 DIAGNOSIS — J3489 Other specified disorders of nose and nasal sinuses: Secondary | ICD-10-CM

## 2019-03-29 DIAGNOSIS — B370 Candidal stomatitis: Secondary | ICD-10-CM

## 2019-03-29 DIAGNOSIS — K2970 Gastritis, unspecified, without bleeding: Secondary | ICD-10-CM

## 2019-03-29 MED ORDER — CETIRIZINE HCL 10 MG PO TABS: 10.0000 mg | ORAL_TABLET | Freq: Every day | ORAL | 1 refills | Status: AC

## 2019-03-29 MED ORDER — FLUCONAZOLE 150 MG PO TABS: 150.0000 mg | ORAL_TABLET | Freq: Once | ORAL | 0 refills | Status: AC

## 2019-03-29 NOTE — Patient Instructions (Signed)
Helicobacter Pylori Infection  Helicobacter pylori infection is a bacterial infection in the stomach. Long-term (chronic) infection can cause stomach irritation (gastritis), ulcers in the stomach (gastric ulcers), and ulcers in the upper part of the intestine (duodenal ulcers). Having this infection may also increase your risk of stomach cancer and a type of white blood cell cancer (lymphoma) that affects the stomach.  What are the causes?  This infection is caused by the Helicobacter pylori (H. pylori) bacteria. Many healthy people have this bacteria in their stomach lining. The bacteria may also spread from person to person through contact with stool (feces) or saliva. It is not known why some people develop ulcers, gastritis, or cancer from the bacteria.  What increases the risk?  You are more likely to develop this condition if you:   Have family members with the infection.   Live with many other people, such as in a dormitory.   Are of African, Hispanic, or Asian descent.  What are the signs or symptoms?  Most people with this infection do not have any symptoms. If you do have symptoms, they may include:   Heartburn.   Stomach pain.   Nausea.   Vomiting. The vomit may be bloody because of ulcers.   Loss of appetite.   Bad breath.  How is this diagnosed?  This condition may be diagnosed based on:   Your symptoms and medical history.   A physical exam.   Blood tests.   Stool tests.   A breath test.   A procedure that involves placing a tube with a camera on the end of it down your throat to examine your stomach and upper intestine (upper endoscopy).   Removing and testing a tissue sample from the stomach lining (biopsy). A biopsy may be taken during an upper endoscopy.  How is this treated?    This condition is treated by taking a combination of medicines (triple therapy) for several weeks. Triple therapy includes one medicine to reduce the amount of acid in your stomach and two types of  antibiotic medicines. This treatment may reduce your risk of cancer.  You may need to be tested for H. pylori again after treatment. In some cases, the treatment may need to be repeated if your treatment did not get rid of all the bacteria.  Follow these instructions at home:     Take over-the-counter and prescription medicines only as told by your health care provider.   Take your antibiotics as told by your health care provider. Do not stop taking the antibiotics even if you start to feel better.   Return to your normal activities as told by your health care provider. Ask your health care provider what activities are safe for you.   Take steps to prevent future infections:  ? Wash your hands often with soap and water. If soap and water are not available, use hand sanitizer.  ? Do not eat food or drink water that may have had contact with stool or saliva.   Keep all follow-up visits as told by your health care provider. This is important. You may need tests to make sure your treatment worked.  Contact a health care provider if your symptoms:   Do not get better with treatment.   Return after treatment.  Summary   Helicobacter pylori infection is a stomach infection caused by the Helicobacter pylori (H. pylori) bacteria.   This infection can cause stomach irritation (gastritis), ulcers in the stomach (gastric ulcers), and ulcers   you start to feel better. This information is not intended to replace advice given to you by your health care provider. Make sure you discuss any questions you have with your health care provider. Document Revised: 04/16/2018 Document Reviewed: 12/17/2016 Elsevier Patient Education  [employer].

## 2019-03-29 NOTE — Progress Notes (Signed)
Went to the ed for acid reflux was given antibiotics she has completed the medications. She did a h.pylori in the ed.  Sates that after taking medications her throat began to hurt.

## 2019-03-29 NOTE — Progress Notes (Signed)
Subjective:  Patient ID: Jennifer Barber, female    DOB: 05-01-1985  Age: 34 y.o. MRN: [id number]  CC: Follow-up and Gastroesophageal Reflux   HPI Equilla Pyland Ewell presents for an acute visit. Seen at urgent care and diagnosed with Helicobacter pylori gastritis for which she was placed on Amoxicillin,Cclarithromycin and Omeprazole. She noticed white spots on the roof of her mouth with soreness on her tongue after completing her course of antibiotics.  She has been gargling with Listerine.  Did not obtain omeprazole at the time due to finances but recently started taking it and has done 3 doses.    She has noticed that after taking Omeprazole she has palpitations, unsure if this is related to her anxiety; this morning she did not take the omeprazole. She also feels like something is stuck in her throat  Past Medical History:  Diagnosis Date  . Abnormal Pap smear   . Anemia   . Bipolar 1 disorder ([place])   . Blood dyscrasia    Sickle Cell Trait  . Depression   . Mental disorder    bipolar, schizophrenia  . Schizophrenia ([place])   . Scoliosis   . Sickle cell trait [facility])     Past Surgical History:  Procedure Laterality Date  . FEMORAL OSTEOTOMY W/ RODDING  2004   s/p accident , leg fx  . LAPAROSCOPIC CHOLECYSTECTOMY     during second pregnancy    Family History  Problem Relation Age of Onset  . Cancer Mother   . Kidney disease Mother   . Alcohol abuse Mother   . Mental illness Mother   . Cancer Maternal Grandmother     Allergies  Allergen Reactions  . Bee Venom Swelling  . Benadryl [Diphenhydramine] Swelling  . Codeine Itching  . Latex Rash    Outpatient Medications Prior to Visit  Medication Sig Dispense Refill  . clotrimazole (LOTRIMIN) 1 % cream Apply 1 application topically 2 (two) times daily. (Patient not taking: Reported on 03/29/2019) 30 g 0  . ferrous sulfate (FERROUSUL) 325 (65 FE) MG tablet Take 1 tablet (325 mg total) by mouth 3 (three) times daily with meals.  30 tablet 3  . hydrOXYzine (ATARAX/VISTARIL) 10 MG tablet Take 1 tablet (10 mg total) by mouth 3 (three) times daily as needed. (Patient not taking: Reported on 03/29/2019) 90 tablet 2  . lamoTRIgine (LAMICTAL) 25 MG tablet Take 25 mg by mouth daily.    . simethicone (MYLICON) 80 MG chewable tablet Chew 1 tablet (80 mg total) by mouth every 6 (six) hours as needed for flatulence. (Patient not taking: Reported on 08/24/2018) 30 tablet 1  . sodium chloride (OCEAN) 0.65 % SOLN nasal spray Place 1 spray into both nostrils as needed for congestion. Reported on 06/02/2015     No facility-administered medications prior to visit.     ROS Review of Systems  Constitutional: Negative for activity change, appetite change and fatigue.  HENT: Negative for congestion, sinus pressure and sore throat.   Eyes: Negative for visual disturbance.  Respiratory: Negative for cough, chest tightness, shortness of breath and wheezing.   Cardiovascular: Negative for chest pain and palpitations.  Gastrointestinal: Negative for abdominal distention, abdominal pain and constipation.  Endocrine: Negative for polydipsia.  Genitourinary: Negative for dysuria and frequency.  Musculoskeletal: Negative for arthralgias and back pain.  Skin: Negative for rash.  Neurological: Negative for tremors, light-headedness and numbness.  Hematological: Does not bruise/bleed easily.  Psychiatric/Behavioral: Negative for agitation and behavioral problems.  Objective:  BP 122/77   Pulse 86   Ht 5' (1.524 m)   Wt 138 lb (62.6 kg)   SpO2 100%   BMI 26.95 kg/m   BP/Weight 03/29/2019 08/24/2018 [id number]  Systolic BP 123XX123 123XX123 -  Diastolic BP 77 75 -  Wt. (Lbs) 138 131 121  BMI 26.95 25.58 23.63      Physical Exam Constitutional:      Appearance: She is well-developed.  HENT:     Mouth/Throat:     Comments: Erythematous patches on soft palate and surface of tongue Neck:     Vascular: No JVD.  Cardiovascular:     Rate and  Rhythm: Normal rate.     Heart sounds: Normal heart sounds. No murmur.  Pulmonary:     Effort: Pulmonary effort is normal.     Breath sounds: Normal breath sounds. No wheezing or rales.  Chest:     Chest wall: No tenderness.  Abdominal:     General: Bowel sounds are normal. There is no distension.     Palpations: Abdomen is soft. There is no mass.     Tenderness: There is no abdominal tenderness.  Musculoskeletal:        General: Normal range of motion.     Right lower leg: No edema.     Left lower leg: No edema.  Lymphadenopathy:     Cervical: No cervical adenopathy.  Neurological:     Mental Status: She is alert and oriented to person, place, and time.  Psychiatric:        Mood and Affect: Mood normal.     CMP Latest Ref Rng & Units 08/24/2018 10/10/2017 03/12/2016  Glucose 65 - 99 mg/dL 75 74 77  BUN 6 - 20 mg/dL 10 11 11   Creatinine 0.57 - 1.00 mg/dL 0.84 0.83 0.98  Sodium 134 - 144 mmol/L 143 140 139  Potassium 3.5 - 5.2 mmol/L 4.0 4.1 3.8  Chloride 96 - 106 mmol/L 106 106 106  CO2 20 - 29 mmol/L 26 22 22   Calcium 8.7 - 10.2 mg/dL 9.6 9.4 9.4  Total Protein 6.0 - 8.5 g/dL 7.0 6.8 7.0  Total Bilirubin 0.0 - 1.2 mg/dL 0.3 0.4 0.3  Alkaline Phos 39 - 117 IU/L 39 39 36  AST 0 - 40 IU/L 11 14 15   ALT 0 - 32 IU/L 13 10 11     Lipid Panel     Component Value Date/Time   CHOL 125 10/10/2017 0900   TRIG 36 10/10/2017 0900   HDL 45 10/10/2017 0900   CHOLHDL 2.8 10/10/2017 0900   CHOLHDL 3.0 05/08/2015 1132   VLDL 7 05/08/2015 1132   LDLCALC 73 10/10/2017 0900    CBC    Component Value Date/Time   WBC 7.5 08/24/2018 1215   WBC 7.1 03/12/2016 0943   RBC 5.03 08/24/2018 1215   RBC 4.86 03/12/2016 0943   HGB 13.6 08/24/2018 1215   HCT 44.9 08/24/2018 1215   PLT 260 08/24/2018 1215   MCV 89 08/24/2018 1215   MCH 27.0 08/24/2018 1215   MCH 27.4 03/12/2016 0943   MCHC 30.3 (L) 08/24/2018 1215   MCHC 32.2 03/12/2016 0943   RDW 12.7 08/24/2018 1215   LYMPHSABS 3.2 (H)  08/24/2018 1215   MONOABS 330 05/08/2015 1132   EOSABS 0.2 08/24/2018 1215   BASOSABS 0.1 08/24/2018 1215    Lab Results  Component Value Date   HGBA1C 5.0 03/12/2016    Assessment & Plan:   1. Helicobacter pylori  gastritis Completed course of treatment No palpitations noted at this visit. Discussed dietary modifications to control reflux symptoms and advised to administer omeprazole first thing in the morning.  If symptoms of palpitation persist she is to stop omeprazole. - H. pylori breath test; Future  2. Thrush, oral Likely due to recent antibiotic use - fluconazole (DIFLUCAN) 150 MG tablet; Take 1 tablet (150 mg total) by mouth once for 1 dose.  Dispense: 1 tablet; Refill: 0  3. Sinus drainage Could explain throat sensation We will place on Zyrtec - cetirizine (ZYRTEC) 10 MG tablet; Take 1 tablet (10 mg total) by mouth daily.  Dispense: 30 tablet; Refill: 1     Charlott Rakes, MD, FAAFP. [facility] and [place], [place]   03/29/2019, 10:43 AM

## 2019-03-30 ENCOUNTER — Telehealth: Payer: Self-pay | Admitting: Family Medicine

## 2019-03-30 NOTE — Telephone Encounter (Signed)
Patient called and request to speak with pcp regarding her having a sharp pain under her left shoulder blade. Patient stated that she just finished an antibiotic and wanted to know if this is something that is usual or not. Patient stated that she also is taking an acid pill. Please follow up at your earliest convenience.

## 2019-03-30 NOTE — Telephone Encounter (Signed)
Will route to PCP for review. 

## 2019-03-31 NOTE — Telephone Encounter (Signed)
Symptoms are unlikely to be related to her antibiotic or acid reflux medication.  Could be muscle spasm and I recommend using an OTC analgesic.  If symptoms persist she needs to schedule an appointment.

## 2019-03-31 NOTE — Telephone Encounter (Signed)
Patient was called and informed of PCP response. 

## 2019-04-29 ENCOUNTER — Ambulatory Visit: Payer: Self-pay | Attending: Family Medicine

## 2019-04-29 ENCOUNTER — Other Ambulatory Visit: Payer: Self-pay

## 2019-04-29 DIAGNOSIS — B9681 Helicobacter pylori [H. pylori] as the cause of diseases classified elsewhere: Secondary | ICD-10-CM

## 2019-04-30 LAB — H. PYLORI BREATH TEST: H pylori Breath Test: NEGATIVE

## 2019-06-18 ENCOUNTER — Telehealth: Payer: Self-pay | Admitting: Family Medicine

## 2019-06-18 NOTE — Telephone Encounter (Signed)
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 Informed patient that no data has been out to show that the vaccine should effect this virus.

## 2019-06-18 NOTE — Telephone Encounter (Signed)
Patient is calling to speak with a nurse because she is trying to get a COVID shot and would like to know if she can receive one due to her medical history.

## 2019-08-16 ENCOUNTER — Other Ambulatory Visit (HOSPITAL_COMMUNITY)
Admission: RE | Admit: 2019-08-16 | Discharge: 2019-08-16 | Disposition: A | Discharge status: Home / Self Care | Payer: Medicaid Other | Source: Ambulatory Visit | Attending: Family Medicine | Admitting: Family Medicine

## 2019-08-16 ENCOUNTER — Ambulatory Visit: Payer: Medicaid Other | Attending: Family Medicine | Admitting: Family Medicine

## 2019-08-16 ENCOUNTER — Encounter: Payer: Self-pay | Admitting: Family Medicine

## 2019-08-16 ENCOUNTER — Other Ambulatory Visit: Payer: Self-pay

## 2019-08-16 VITALS — BP 102/66 | HR 81 | Ht 60.0 in | Wt 136.6 lb

## 2019-08-16 DIAGNOSIS — Z124 Encounter for screening for malignant neoplasm of cervix: Secondary | ICD-10-CM | POA: Insufficient documentation

## 2019-08-16 DIAGNOSIS — Z0000 Encounter for general adult medical examination without abnormal findings: Secondary | ICD-10-CM

## 2019-08-16 DIAGNOSIS — N939 Abnormal uterine and vaginal bleeding, unspecified: Secondary | ICD-10-CM | POA: Diagnosis not present

## 2019-08-16 DIAGNOSIS — D170 Benign lipomatous neoplasm of skin and subcutaneous tissue of head, face and neck: Secondary | ICD-10-CM

## 2019-08-16 DIAGNOSIS — Z113 Encounter for screening for infections with a predominantly sexual mode of transmission: Secondary | ICD-10-CM | POA: Diagnosis present

## 2019-08-16 DIAGNOSIS — R140 Abdominal distension (gaseous): Secondary | ICD-10-CM

## 2019-08-16 NOTE — Progress Notes (Signed)
States that she is having a lot of bloating and gas.  Has been bleeding off and on for about 1 month, she has Nexplanon implant.

## 2019-08-16 NOTE — Progress Notes (Signed)
Subjective:  Patient ID: Jennifer Barber, female    DOB: 12-Jan-1985  Age: 34 y.o. MRN: [id number]  CC: Gynecologic Exam and Annual Exam   HPI Jennifer Barber presents for an annual physical exam and her Pap smear is due in 11/2019 however she would like to proceed with having this done today. She has additional concerns at this visit. She has noticed abnormal uterine bleeds. Bled for 3 weeks and the last 2 days had brownish blood.  Her menstrual periods last about 3 weeks and on a regular.  Nexplanon was inserted in 2019 per patient but then she goes on to state that she thinks she might be coming up on her 3-year mark and does have a gynecologist.  Denies presence of abdominal cramping. She complains of abdominal bloating which is intermittent and occurs sometimes when she has not had a meal.  She was tested for H. pylori bacteria but this was negative.  She does have Protonix which she uses as needed reflux. She has a swelling on the left side of her forehead which she was told was a fatty tissue and is requesting referral for excision.  Reviewed CT scan from 2018 which revealed benign lipoma. Past Medical History:  Diagnosis Date  . Abnormal Pap smear   . Anemia   . Bipolar 1 disorder ([place])   . Blood dyscrasia    Sickle Cell Trait  . Depression   . Mental disorder    bipolar, schizophrenia  . Schizophrenia ([place])   . Scoliosis   . Sickle cell trait [facility])     Past Surgical History:  Procedure Laterality Date  . FEMORAL OSTEOTOMY W/ RODDING  2004   s/p accident , leg fx  . LAPAROSCOPIC CHOLECYSTECTOMY     during second pregnancy    Family History  Problem Relation Age of Onset  . Cancer Mother   . Kidney disease Mother   . Alcohol abuse Mother   . Mental illness Mother   . Cancer Maternal Grandmother     Allergies  Allergen Reactions  . Bee Venom Swelling  . Benadryl [Diphenhydramine] Swelling  . Codeine Itching  . Latex Rash    Outpatient Medications Prior to Visit    Medication Sig Dispense Refill  . cetirizine (ZYRTEC) 10 MG tablet Take 1 tablet (10 mg total) by mouth daily. (Patient not taking: Reported on 08/16/2019) 30 tablet 1  . clotrimazole (LOTRIMIN) 1 % cream Apply 1 application topically 2 (two) times daily. (Patient not taking: Reported on 03/29/2019) 30 g 0  . ferrous sulfate (FERROUSUL) 325 (65 FE) MG tablet Take 1 tablet (325 mg total) by mouth 3 (three) times daily with meals. 30 tablet 3  . hydrOXYzine (ATARAX/VISTARIL) 10 MG tablet Take 1 tablet (10 mg total) by mouth 3 (three) times daily as needed. (Patient not taking: Reported on 03/29/2019) 90 tablet 2  . lamoTRIgine (LAMICTAL) 25 MG tablet Take 25 mg by mouth daily. (Patient not taking: Reported on 08/16/2019)    . simethicone (MYLICON) 80 MG chewable tablet Chew 1 tablet (80 mg total) by mouth every 6 (six) hours as needed for flatulence. (Patient not taking: Reported on 08/24/2018) 30 tablet 1  . sodium chloride (OCEAN) 0.65 % SOLN nasal spray Place 1 spray into both nostrils as needed for congestion. Reported on 06/02/2015 (Patient not taking: Reported on 08/16/2019)     No facility-administered medications prior to visit.     ROS Review of Systems  Constitutional: Negative for activity change,  appetite change and fatigue.  HENT: Negative for congestion, sinus pressure and sore throat.   Eyes: Negative for visual disturbance.  Respiratory: Negative for cough, chest tightness, shortness of breath and wheezing.   Cardiovascular: Negative for chest pain and palpitations.  Gastrointestinal: Negative for abdominal distention, abdominal pain and constipation.  Endocrine: Negative for polydipsia.  Genitourinary: Positive for menstrual problem. Negative for dysuria and frequency.  Musculoskeletal: Negative for arthralgias and back pain.  Skin: Negative for rash.  Neurological: Negative for tremors, light-headedness and numbness.  Hematological: Does not bruise/bleed easily.   Psychiatric/Behavioral: Negative for agitation and behavioral problems.    Objective:  BP 102/66   Pulse 81   Ht 5' (1.524 m)   Wt 136 lb 9.6 oz (62 kg)   SpO2 100%   BMI 26.68 kg/m   BP/Weight 08/16/2019 03/29/2019 08/24/2018  Systolic BP 102 122 121  Diastolic BP 66 77 75  Wt. (Lbs) 136.6 138 131  BMI 26.68 26.95 25.58      Physical Exam Constitutional:      General: She is not in acute distress.    Appearance: She is well-developed. She is not diaphoretic.  HENT:     Head: Normocephalic.     Right Ear: External ear normal.     Left Ear: External ear normal.     Nose: Nose normal.  Eyes:     Conjunctiva/sclera: Conjunctivae normal.     Pupils: Pupils are equal, round, and reactive to light.  Neck:     Vascular: No JVD.  Cardiovascular:     Rate and Rhythm: Normal rate and regular rhythm.     Heart sounds: Normal heart sounds. No murmur heard.  No gallop.   Pulmonary:     Effort: Pulmonary effort is normal. No respiratory distress.     Breath sounds: Normal breath sounds. No wheezing or rales.  Chest:     Chest wall: No tenderness.     Breasts:        Right: No mass, skin change or tenderness.        Left: No mass, skin change or tenderness.  Abdominal:     General: Bowel sounds are normal. There is no distension.     Palpations: Abdomen is soft. There is no mass.     Tenderness: There is no abdominal tenderness.  Genitourinary:    Comments: External genitalia normal. Vagina with brownish blood which was cleaned with a large swab.  Cervix normal. Musculoskeletal:        General: No tenderness. Normal range of motion.     Cervical back: Normal range of motion.  Skin:    General: Skin is warm and dry.     Findings: Lesion (2cm swelling on left forehead, non TTP) present.  Neurological:     Mental Status: She is alert and oriented to person, place, and time.     Deep Tendon Reflexes: Reflexes are normal and symmetric.     CMP Latest Ref Rng & Units  08/24/2018 10/10/2017 03/12/2016  Glucose 65 - 99 mg/dL 75 74 77  BUN 6 - 20 mg/dL 10 11 11  Creatinine 0.57 - 1.00 mg/dL 0.84 0.83 0.98  Sodium 134 - 144 mmol/L 143 140 139  Potassium 3.5 - 5.2 mmol/L 4.0 4.1 3.8  Chloride 96 - 106 mmol/L 106 106 106  CO2 20 - 29 mmol/L 26 22 22  Calcium 8.7 - 10.2 mg/dL 9.6 9.4 9.4  Total Protein 6.0 - 8.5 g/dL 7.0 6.8 7.0    Total Bilirubin 0.0 - 1.2 mg/dL 0.3 0.4 0.3  Alkaline Phos 39 - 117 IU/L 39 39 36  AST 0 - 40 IU/L _0 ALT 0 - 32 IU/L _1 Lipid Panel     Component Value Date/Time   CHOL 125 10/10/2017 0900   TRIG 36 10/10/2017 0900   HDL 45 10/10/2017 0900   CHOLHDL 2.8 10/10/2017 0900   CHOLHDL 3.0 05/08/2015 1132   VLDL 7 05/08/2015 1132   LDLCALC 73 10/10/2017 0900    CBC    Component Value Date/Time   WBC 7.5 08/24/2018 1215   WBC 7.1 03/12/2016 0943   RBC 5.03 08/24/2018 1215   RBC 4.86 03/12/2016 0943   HGB 13.6 08/24/2018 1215   HCT 44.9 08/24/2018 1215   PLT 260 08/24/2018 1215   MCV 89 08/24/2018 1215   MCH 27.0 08/24/2018 1215   MCH 27.4 03/12/2016 0943   MCHC 30.3 (L) 08/24/2018 1215   MCHC 32.2 03/12/2016 0943   RDW 12.7 08/24/2018 1215   LYMPHSABS 3.2 (H) 08/24/2018 1215   MONOABS 330 05/08/2015 1132   EOSABS 0.2 08/24/2018 1215   BASOSABS 0.1 08/24/2018 1215    Lab Results  Component Value Date   HGBA1C 5.0 03/12/2016    Assessment & Plan:  1. Annual physical exam Counseled on 150 minutes of exercise per week, healthy eating (including decreased daily intake of saturated fats, cholesterol, added sugars, sodium), STI prevention, routine healthcare maintenance. - Lipid panel - CMP14+EGFR  2. Screening for STD (sexually transmitted disease) - Cervicovaginal ancillary only  3. Screening for cervical cancer - Cytology - PAP([place])  4. Lipoma of face Patient request referral to surgeon for excision and I have placed referral per request - Ambulatory referral to Plastic Surgery  5.  Abnormal uterine bleeding She might be a candidate for Mirena IUD Advised to discuss this with her GYN We'll check CBC to exclude anemia - CBC with Differential/Platelet  6. Abdominal bloating Tested negative for H. pylori in the past Advised to use Gas-X OTC Avoid foods that trigger   Follow-up: Return in about 1 year (around 08/15/2020) for annual physical exam.       Charlott Rakes, MD, FAAFP. [facility] and [place], [place]   08/16/2019, 12:11 PM

## 2019-08-16 NOTE — Patient Instructions (Signed)
Lipoma  A lipoma is a noncancerous (benign) tumor that is made up of fat cells. This is a very common type of soft-tissue growth. Lipomas are usually found under the skin (subcutaneous). They may occur in any tissue of the body that contains fat. Common areas for lipomas to appear include the back, arms, shoulders, buttocks, and thighs. Lipomas grow slowly, and they are usually painless. Most lipomas do not cause problems and do not require treatment. What are the causes? The cause of this condition is not known. What increases the risk? You are more likely to develop this condition if:  You are 76-2 years old.  You have a family history of lipomas. What are the signs or symptoms? A lipoma usually appears as a small, round bump under the skin. In most cases, the lump will:  Feel soft or rubbery.  Not cause pain or other symptoms. However, if a lipoma is located in an area where it pushes on nerves, it can become painful or cause other symptoms. How is this diagnosed? A lipoma can usually be diagnosed with a physical exam. You may also have tests to confirm the diagnosis and to rule out other conditions. Tests may include:  Imaging tests, such as a CT scan or an MRI.  Removal of a tissue sample to be looked at under a microscope (biopsy). How is this treated? Treatment for this condition depends on the size of the lipoma and whether it is causing any symptoms.  For small lipomas that are not causing problems, no treatment is needed.  If a lipoma is bigger or it causes problems, surgery may be done to remove the lipoma. Lipomas can also be removed to improve appearance. Most often, the procedure is done after applying a medicine that numbs the area (local anesthetic).  Liposuction may be done to reduce the size of the lipoma before it is removed through surgery, or it may be done to remove the lipoma. Lipomas are removed with this method in order to limit incision size and scarring. A  liposuction tube is inserted through a small incision into the lipoma, and the contents of the lipoma are removed through the tube with suction. Follow these instructions at home:  Watch your lipoma for any changes.  Keep all follow-up visits as told by your health care provider. This is important. Contact a health care provider if:  Your lipoma becomes larger or hard.  Your lipoma becomes painful, red, or increasingly swollen. These could be signs of infection or a more serious condition. Get help right away if:  You develop tingling or numbness in an area near the lipoma. This could indicate that the lipoma is causing nerve damage. Summary  A lipoma is a noncancerous tumor that is made up of fat cells.  Most lipomas do not cause problems and do not require treatment.  If a lipoma is bigger or it causes problems, surgery may be done to remove the lipoma.  Contact a health care provider if your lipoma becomes larger or hard, or if it becomes painful, red, or increasingly swollen. Pain, redness, and swelling could be signs of infection or a more serious condition. This information is not intended to replace advice given to you by your health care provider. Make sure you discuss any questions you have with your health care provider. Document Revised: 08/10/2018 Document Reviewed: 08/10/2018 Elsevier Patient Education  [place] Maintenance, Female Adopting a healthy lifestyle and getting preventive care  are important in promoting health and wellness. Ask your health care provider about:  The right schedule for you to have regular tests and exams.  Things you can do on your own to prevent diseases and keep yourself healthy. What should I know about diet, weight, and exercise? Eat a healthy diet   Eat a diet that includes plenty of vegetables, fruits, low-fat dairy products, and lean protein.  Do not eat a lot of foods that are high in solid fats, added  sugars, or sodium. Maintain a healthy weight Body mass index (BMI) is used to identify weight problems. It estimates body fat based on height and weight. Your health care provider can help determine your BMI and help you achieve or maintain a healthy weight. Get regular exercise Get regular exercise. This is one of the most important things you can do for your health. Most adults should:  Exercise for at least 150 minutes each week. The exercise should increase your heart rate and make you sweat (moderate-intensity exercise).  Do strengthening exercises at least twice a week. This is in addition to the moderate-intensity exercise.  Spend less time sitting. Even light physical activity can be beneficial. Watch cholesterol and blood lipids Have your blood tested for lipids and cholesterol at 35 years of age, then have this test every 5 years. Have your cholesterol levels checked more often if:  Your lipid or cholesterol levels are high.  You are older than 35 years of age.  You are at high risk for heart disease. What should I know about cancer screening? Depending on your health history and family history, you may need to have cancer screening at various ages. This may include screening for:  Breast cancer.  Cervical cancer.  Colorectal cancer.  Skin cancer.  Lung cancer. What should I know about heart disease, diabetes, and high blood pressure? Blood pressure and heart disease  High blood pressure causes heart disease and increases the risk of stroke. This is more likely to develop in people who have high blood pressure readings, are of African descent, or are overweight.  Have your blood pressure checked: ? Every 3-5 years if you are 48-85 years of age. ? Every year if you are 40 years old or older. Diabetes Have regular diabetes screenings. This checks your fasting blood sugar level. Have the screening done:  Once every three years after age 108 if you are at a normal  weight and have a low risk for diabetes.  More often and at a younger age if you are overweight or have a high risk for diabetes. What should I know about preventing infection? Hepatitis B If you have a higher risk for hepatitis B, you should be screened for this virus. Talk with your health care provider to find out if you are at risk for hepatitis B infection. Hepatitis C Testing is recommended for:  Everyone born from [birth year] through 1966.  Anyone with known risk factors for hepatitis C. Sexually transmitted infections (STIs)  Get screened for STIs, including gonorrhea and chlamydia, if: ? You are sexually active and are younger than 35 years of age. ? You are older than 36 years of age and your health care provider tells you that you are at risk for this type of infection. ? Your sexual activity has changed since you were last screened, and you are at increased risk for chlamydia or gonorrhea. Ask your health care provider if you are at risk.  Ask your health care provider  about whether you are at high risk for HIV. Your health care provider may recommend a prescription medicine to help prevent HIV infection. If you choose to take medicine to prevent HIV, you should first get tested for HIV. You should then be tested every 3 months for as long as you are taking the medicine. Pregnancy  If you are about to stop having your period (premenopausal) and you may become pregnant, seek counseling before you get pregnant.  Take 400 to 800 micrograms (mcg) of folic acid every day if you become pregnant.  Ask for birth control (contraception) if you want to prevent pregnancy. Osteoporosis and menopause Osteoporosis is a disease in which the bones lose minerals and strength with aging. This can result in bone fractures. If you are 36 years old or older, or if you are at risk for osteoporosis and fractures, ask your health care provider if you should:  Be screened for bone loss.  Take a calcium  or vitamin D supplement to lower your risk of fractures.  Be given hormone replacement therapy (HRT) to treat symptoms of menopause. Follow these instructions at home: Lifestyle  Do not use any products that contain nicotine or tobacco, such as cigarettes, e-cigarettes, and chewing tobacco. If you need help quitting, ask your health care provider.  Do not use street drugs.  Do not share needles.  Ask your health care provider for help if you need support or information about quitting drugs. Alcohol use  Do not drink alcohol if: ? Your health care provider tells you not to drink. ? You are pregnant, may be pregnant, or are planning to become pregnant.  If you drink alcohol: ? Limit how much you use to 0-1 drink a day. ? Limit intake if you are breastfeeding.  Be aware of how much alcohol is in your drink. In the [place] one drink equals one 12 oz bottle of beer (355 mL), one 5 oz glass of wine (148 mL), or one 1 oz glass of hard liquor (44 mL). General instructions  Schedule regular health, dental, and eye exams.  Stay current with your vaccines.  Tell your health care provider if: ? You often feel depressed. ? You have ever been abused or do not feel safe at home. Summary  Adopting a healthy lifestyle and getting preventive care are important in promoting health and wellness.  Follow your health care provider's instructions about healthy diet, exercising, and getting tested or screened for diseases.  Follow your health care provider's instructions on monitoring your cholesterol and blood pressure. This information is not intended to replace advice given to you by your health care provider. Make sure you discuss any questions you have with your health care provider. Document Revised: 12/17/2017 Document Reviewed: 12/17/2017 Elsevier Patient Education  2020 [employer].

## 2019-08-17 ENCOUNTER — Telehealth: Payer: Self-pay | Admitting: Family Medicine

## 2019-08-17 LAB — CBC WITH DIFFERENTIAL/PLATELET
Basophils Absolute: 0.1 10*3/uL (ref 0.0–0.2)
Basos: 1 %
EOS (ABSOLUTE): 0.1 10*3/uL (ref 0.0–0.4)
Eos: 2 %
Hematocrit: 42.3 % (ref 34.0–46.6)
Hemoglobin: 13.2 g/dL (ref 11.1–15.9)
Immature Grans (Abs): 0 10*3/uL (ref 0.0–0.1)
Immature Granulocytes: 0 %
Lymphocytes Absolute: 2.9 10*3/uL (ref 0.7–3.1)
Lymphs: 43 %
MCH: 26.5 pg — ABNORMAL LOW (ref 26.6–33.0)
MCHC: 31.2 g/dL — ABNORMAL LOW (ref 31.5–35.7)
MCV: 85 fL (ref 79–97)
Monocytes Absolute: 0.4 10*3/uL (ref 0.1–0.9)
Monocytes: 6 %
Neutrophils Absolute: 3.2 10*3/uL (ref 1.4–7.0)
Neutrophils: 48 %
Platelets: 256 10*3/uL (ref 150–450)
RBC: 4.99 x10E6/uL (ref 3.77–5.28)
RDW: 12.6 % (ref 11.7–15.4)
WBC: 6.7 10*3/uL (ref 3.4–10.8)

## 2019-08-17 LAB — CERVICOVAGINAL ANCILLARY ONLY
Bacterial Vaginitis (gardnerella): NEGATIVE
Candida Glabrata: NEGATIVE
Candida Vaginitis: NEGATIVE
Chlamydia: NEGATIVE
Comment: NEGATIVE
Comment: NEGATIVE
Comment: NEGATIVE
Comment: NEGATIVE
Comment: NEGATIVE
Comment: NORMAL
Neisseria Gonorrhea: NEGATIVE
Trichomonas: NEGATIVE

## 2019-08-17 LAB — CMP14+EGFR
ALT: 11 IU/L (ref 0–32)
AST: 13 IU/L (ref 0–40)
Albumin/Globulin Ratio: 1.5 (ref 1.2–2.2)
Albumin: 4.4 g/dL (ref 3.8–4.8)
Alkaline Phosphatase: 46 IU/L — ABNORMAL LOW (ref 48–121)
BUN/Creatinine Ratio: 9 (ref 9–23)
BUN: 8 mg/dL (ref 6–20)
Bilirubin Total: 0.5 mg/dL (ref 0.0–1.2)
CO2: 23 mmol/L (ref 20–29)
Calcium: 9.7 mg/dL (ref 8.7–10.2)
Chloride: 105 mmol/L (ref 96–106)
Creatinine, Ser: 0.92 mg/dL (ref 0.57–1.00)
GFR calc Af Amer: 93 mL/min/{1.73_m2} (ref >59)
GFR calc non Af Amer: 81 mL/min/{1.73_m2} (ref >59)
Globulin, Total: 3 g/dL (ref 1.5–4.5)
Glucose: 95 mg/dL (ref 65–99)
Potassium: 3.9 mmol/L (ref 3.5–5.2)
Sodium: 139 mmol/L (ref 134–144)
Total Protein: 7.4 g/dL (ref 6.0–8.5)

## 2019-08-17 LAB — CYTOLOGY - PAP
Comment: NEGATIVE
Diagnosis: NEGATIVE
High risk HPV: NEGATIVE

## 2019-08-17 LAB — LIPID PANEL
Chol/HDL Ratio: 2.9 ratio (ref 0.0–4.4)
Cholesterol, Total: 155 mg/dL (ref 100–199)
HDL: 53 mg/dL (ref >39)
LDL Chol Calc (NIH): 93 mg/dL (ref 0–99)
Triglycerides: 39 mg/dL (ref 0–149)
VLDL Cholesterol Cal: 9 mg/dL (ref 5–40)

## 2019-08-17 NOTE — Telephone Encounter (Signed)
Copied from [place] [id number]. Topic: General - Other >> Aug 17, 2019 10:31 AM Celene Kras wrote: Reason for CRM: Pt called and is requesting to have a nurse give her a call back regarding her lab results. Please advise .

## 2019-08-17 NOTE — Telephone Encounter (Signed)
Patient was called and informed that she will be contacted once results are sent.

## 2019-08-19 ENCOUNTER — Ambulatory Visit: Payer: Self-pay | Admitting: *Deleted

## 2019-08-19 NOTE — Telephone Encounter (Signed)
Patient is calling with chest heaviness and fatigue that started yesterday.Patient reports she has had prolonged bleeding- 3 weeks or longer  Reason for Disposition  [1] Chest pain lasts > 5 minutes AND [2] described as crushing, pressure-like, or heavy  Answer Assessment - Initial Assessment Questions 1. LOCATION: "Where does it hurt?"       Heaviness middle of chest 2. RADIATION: "Does the pain go anywhere else?" (e.g., into neck, jaw, arms, back)     no 3. ONSET: "When did the chest pain begin?" (Minutes, hours or days)      yesterday 4. PATTERN "Does the pain come and go, or has it been constant since it started?"  "Does it get worse with exertion?"      Constant- gets worse when moving 5. DURATION: "How long does it last" (e.g., seconds, minutes, hours)     Minutes/hours 6. SEVERITY: "How bad is the pain?"  (e.g., Scale 1-10; mild, moderate, or severe)    - MILD (1-3): doesn't interfere with normal activities     - MODERATE (4-7): interferes with normal activities or awakens from sleep    - SEVERE (8-10): excruciating pain, unable to do any normal activities       moderate 7. CARDIAC RISK FACTORS: "Do you have any history of heart problems or risk factors for heart disease?" (e.g., angina, prior heart attack; diabetes, high blood pressure, high cholesterol, smoker, or strong family history of heart disease)     no 8. PULMONARY RISK FACTORS: "Do you have any history of lung disease?"  (e.g., blood clots in lung, asthma, emphysema, birth control pills)     no 9. CAUSE: "What do you think is causing the chest pain?"     GERD- maybe 10. OTHER SYMPTOMS: "Do you have any other symptoms?" (e.g., dizziness, nausea, vomiting, sweating, fever, difficulty breathing, cough)       Dizziness 11. PREGNANCY: "Is there any chance you are pregnant?" "When was your last menstrual period?"       Uses Norplant  Protocols used: CHEST PAIN-A-AH

## 2019-08-19 NOTE — Telephone Encounter (Signed)
Patient returning call for lab results. 

## 2019-08-20 NOTE — Telephone Encounter (Signed)
FYI for provider review.

## 2019-08-20 NOTE — Telephone Encounter (Signed)
Pt checking status lab results

## 2019-08-23 NOTE — Telephone Encounter (Signed)
Patient was called and informed of results. 

## 2019-08-23 NOTE — Telephone Encounter (Signed)
Will route to PCP 

## 2019-09-01 ENCOUNTER — Ambulatory Visit (INDEPENDENT_AMBULATORY_CARE_PROVIDER_SITE_OTHER): Payer: Self-pay | Admitting: Obstetrics and Gynecology

## 2019-09-01 ENCOUNTER — Encounter: Payer: Self-pay | Admitting: Obstetrics and Gynecology

## 2019-09-01 ENCOUNTER — Telehealth: Payer: Self-pay | Admitting: Family Medicine

## 2019-09-01 ENCOUNTER — Telehealth: Payer: Self-pay | Admitting: Obstetrics and Gynecology

## 2019-09-01 VITALS — BP 110/71 | HR 69 | Ht 60.0 in | Wt 136.0 lb

## 2019-09-01 DIAGNOSIS — Z3202 Encounter for pregnancy test, result negative: Secondary | ICD-10-CM

## 2019-09-01 DIAGNOSIS — N926 Irregular menstruation, unspecified: Secondary | ICD-10-CM

## 2019-09-01 DIAGNOSIS — Z3046 Encounter for surveillance of implantable subdermal contraceptive: Secondary | ICD-10-CM

## 2019-09-01 LAB — POCT URINE PREGNANCY: Preg Test, Ur: NEGATIVE

## 2019-09-01 MED ORDER — ESTRADIOL 1 MG PO TABS: 1.0000 mg | ORAL_TABLET | Freq: Every day | ORAL | 3 refills | Status: DC

## 2019-09-01 NOTE — Progress Notes (Signed)
PATIENT ID: Jennifer Barber, female     DOB: 04/01/1985, 34 y.o.     MRN: [id number]   [facility] Visit  09/01/19     PATIENT NAME: Jennifer Barber     MRN [id number]     DOB: 12/13/1985  CC & HPI:  No chief complaint on file.  Jennifer Barber is a 34 y.o. female presenting today for a discussion regarding her abnormal menses on Nexplanon.  Patient had 2 years into the 3-year cycle, Nexplanon due for replacement July 2022 Recent ultrasound normal ROS:  Review of Systems   Pertinent History Reviewed:  Medical         Past Medical History:  Diagnosis Date  . Abnormal Pap smear   . Anemia   . Bipolar 1 disorder ([place])   . Blood dyscrasia    Sickle Cell Trait  . Depression   . Mental disorder    bipolar, schizophrenia  . Schizophrenia ([place])   . Scoliosis   . Sickle cell trait ([place])                               Surgical Hx:    Past Surgical History:  Procedure Laterality Date  . FEMORAL OSTEOTOMY W/ RODDING  2004   s/p accident , leg fx  . LAPAROSCOPIC CHOLECYSTECTOMY     during second pregnancy   Medications: Reviewed & Updated - see associated section                       Current Outpatient Medications:  .  cetirizine (ZYRTEC) 10 MG tablet, Take 1 tablet (10 mg total) by mouth daily. (Patient not taking: Reported on 08/16/2019), Disp: 30 tablet, Rfl: 1 .  clotrimazole (LOTRIMIN) 1 % cream, Apply 1 application topically 2 (two) times daily. (Patient not taking: Reported on 03/29/2019), Disp: 30 g, Rfl: 0 .  ferrous sulfate (FERROUSUL) 325 (65 FE) MG tablet, Take 1 tablet (325 mg total) by mouth 3 (three) times daily with meals., Disp: 30 tablet, Rfl: 3 .  hydrOXYzine (ATARAX/VISTARIL) 10 MG tablet, Take 1 tablet (10 mg total) by mouth 3 (three) times daily as needed. (Patient not taking: Reported on 03/29/2019), Disp: 90 tablet, Rfl: 2 .  lamoTRIgine (LAMICTAL) 25 MG tablet, Take 25 mg by mouth daily. (Patient not taking: Reported on 08/16/2019), Disp: , Rfl:  .   simethicone (MYLICON) 80 MG chewable tablet, Chew 1 tablet (80 mg total) by mouth every 6 (six) hours as needed for flatulence. (Patient not taking: Reported on 08/24/2018), Disp: 30 tablet, Rfl: 1 .  sodium chloride (OCEAN) 0.65 % SOLN nasal spray, Place 1 spray into both nostrils as needed for congestion. Reported on 06/02/2015 (Patient not taking: Reported on 08/16/2019), Disp: , Rfl:    Social History: Reviewed -  reports that she has quit smoking. Her smoking use included e-cigarettes. She has a 2.75 pack-year smoking history. She has never used smokeless tobacco.  Objective Findings:  Vitals: There were no vitals taken for this visit.  PHYSICAL EXAMINATION General appearance - alert, well appearing, and in no distress and normal appearing weight Mental status - alert, oriented to person, place, and time Chest - not examined Heart - normal rate and regular rhythm Abdomen - not examined Breasts -  Skin -   PELVIC not required   Assessment & Plan:   A:  1. Breakthrough bleeding on Nexplanon  P:  1. Add estradiol 1.0 mg tablets daily on days of breakthrough bleeding, discussed by MyChart discussion in 2 weeks if persistent   By signing my name below, I, [employer], attest that this documentation has been prepared under the direction and in the presence of [name], MD. Electronically Signed: [place]. 09/01/19. 1:02 AM.  I personally performed the services described in this documentation, which was SCRIBED in my presence. The recorded information has been reviewed and considered accurate. It has been edited as necessary during review. [name], MD

## 2019-09-01 NOTE — Telephone Encounter (Signed)
Patient states that [facility] has not received her medicine and would like her medication rx to be resent.

## 2019-09-01 NOTE — Addendum Note (Signed)
Addended by: Janece Canterbury on: 09/01/2019 02:16 PM   Modules accepted: Orders

## 2019-09-01 NOTE — Telephone Encounter (Signed)
Patient came in saying she had a COVID test done in [place] and her employer will not accept the results she received. Patient employer states they need a letter from the patient PCP saying she can return to work. Printed out patient results and will put them in PCP box. Please f/u

## 2019-09-01 NOTE — Telephone Encounter (Signed)
Will route to PCP for review. 

## 2019-09-01 NOTE — Telephone Encounter (Signed)
Done.  I have sent a copy to her via [place].

## 2019-09-02 NOTE — Telephone Encounter (Signed)
Patient has received letter

## 2019-09-08 ENCOUNTER — Encounter: Payer: Medicaid Other | Admitting: Family Medicine

## 2019-09-23 ENCOUNTER — Other Ambulatory Visit: Payer: Self-pay | Admitting: Obstetrics and Gynecology

## 2019-09-23 NOTE — Telephone Encounter (Signed)
Estradiol 1 mg used on days of spotting on Nexplanon.

## 2019-10-06 ENCOUNTER — Institutional Professional Consult (permissible substitution): Payer: Medicaid Other | Admitting: Plastic Surgery

## 2019-10-14 ENCOUNTER — Ambulatory Visit: Payer: Medicaid Other | Admitting: Family Medicine

## 2019-10-20 ENCOUNTER — Ambulatory Visit: Payer: Self-pay | Attending: Family Medicine

## 2019-10-20 ENCOUNTER — Other Ambulatory Visit: Payer: Self-pay

## 2019-10-20 ENCOUNTER — Telehealth: Payer: Self-pay

## 2019-10-20 DIAGNOSIS — J029 Acute pharyngitis, unspecified: Secondary | ICD-10-CM

## 2019-10-20 NOTE — Progress Notes (Signed)
Patient arrived at clinic to get testing for Strep. Her son was negative for all testing but she woke with a sore throat.  Patient was informed she will be contacted once results are in.

## 2019-10-20 NOTE — Telephone Encounter (Signed)
Please advised to obtain OTC Allegra or Zyrtec and that would help with sinus symptoms.  Can also use a nasal spray.

## 2019-10-20 NOTE — Telephone Encounter (Signed)
Sorry to bother, but I think I may have a sinus infection,  had to pull over from driving cause I'm feeling weak...head feels stuffed...  This message was sent via mychart patient was seen today to be swabbed for Strep due to her son having some symptoms but son tested negative.

## 2019-10-21 LAB — STREP A DNA PROBE: Strep Gp A Direct, DNA Probe: NEGATIVE

## 2019-10-21 NOTE — Telephone Encounter (Signed)
Patient has been sent a mychart message in regards to PCP response.

## 2019-11-02 ENCOUNTER — Telehealth: Payer: Self-pay

## 2019-11-02 ENCOUNTER — Encounter: Payer: Self-pay | Admitting: Family Medicine

## 2019-11-02 NOTE — Telephone Encounter (Signed)
She can send a copy of the result to me on my chart

## 2019-11-02 NOTE — Telephone Encounter (Signed)
Patient got negative covid results on yesterday. Results are not in epic, patient is needing letter to return to work after negative test.

## 2019-11-03 ENCOUNTER — Encounter: Payer: Self-pay | Admitting: Family Medicine

## 2019-11-03 ENCOUNTER — Ambulatory Visit: Payer: Medicaid Other | Attending: Family Medicine | Admitting: Family Medicine

## 2019-11-03 ENCOUNTER — Other Ambulatory Visit: Payer: Self-pay

## 2019-11-03 DIAGNOSIS — J018 Other acute sinusitis: Secondary | ICD-10-CM

## 2019-11-03 MED ORDER — AMOXICILLIN-POT CLAVULANATE 875-125 MG PO TABS: 1.0000 | ORAL_TABLET | Freq: Two times a day (BID) | ORAL | 0 refills | Status: DC

## 2019-11-03 NOTE — Progress Notes (Signed)
Virtual Visit via Telephone Note  I connected with Jennifer Barber, on 11/03/2019 at 9:28 AM by telephone due to the COVID-19 pandemic and verified that I am speaking with the correct person using two identifiers.   Consent: I discussed the limitations, risks, security and privacy concerns of performing an evaluation and management service by telephone and the availability of in person appointments. I also discussed with the patient that there may be a patient responsible charge related to this service. The patient expressed understanding and agreed to proceed.   Location of Patient: Home  Location of Provider: Clinic   Persons participating in Telemedicine visit: MIRJANA TARLETON Ozzie Hoyle Dr. Margarita Rana     History of Present Illness: Jennifer Barber presents today for an acute visit.   She has had a cough, chest congestion and doing OTC allergy and sinus medications all to no avail. Has a nasty smell in her nose, has a post nasal drip and mucus is sometimes brown at other times yellow. Symptoms  Have been on for 2 weeks. COVID tests have been negative.  Her husband was exposed to a patient with COVID-19 and had been home for 2 weeks and the patient herself has been home for 10 days. Denies facial pressure, fever, myalgias, GI symptoms.  Past Medical History:  Diagnosis Date  . Abnormal Pap smear   . Anemia   . Bipolar 1 disorder ([place])   . Blood dyscrasia    Sickle Cell Trait  . Depression   . Mental disorder    bipolar, schizophrenia  . Schizophrenia ([place])   . Scoliosis   . Sickle cell trait ([facility])    Allergies  Allergen Reactions  . Bee Venom Swelling  . Benadryl [Diphenhydramine] Swelling  . Codeine Itching  . Latex Rash    Current Outpatient Medications on File Prior to Visit  Medication Sig Dispense Refill  . cetirizine (ZYRTEC) 10 MG tablet Take 1 tablet (10 mg total) by mouth daily. 30 tablet 1  . estradiol (ESTRACE) 1 MG tablet TAKE 1 TABLET BY MOUTH  EVERY DAY 90 tablet 2  . pantoprazole (PROTONIX) 20 MG tablet Take 20 mg by mouth daily.    . sodium chloride (OCEAN) 0.65 % SOLN nasal spray Place 1 spray into both nostrils as needed for congestion. Reported on 06/02/2015    . clotrimazole (LOTRIMIN) 1 % cream Apply 1 application topically 2 (two) times daily. (Patient not taking: Reported on 03/29/2019) 30 g 0  . ferrous sulfate (FERROUSUL) 325 (65 FE) MG tablet Take 1 tablet (325 mg total) by mouth 3 (three) times daily with meals. 30 tablet 3  . hydrOXYzine (ATARAX/VISTARIL) 10 MG tablet Take 1 tablet (10 mg total) by mouth 3 (three) times daily as needed. (Patient not taking: Reported on 03/29/2019) 90 tablet 2  . lamoTRIgine (LAMICTAL) 25 MG tablet Take 25 mg by mouth daily. (Patient not taking: Reported on 08/16/2019)    . simethicone (MYLICON) 80 MG chewable tablet Chew 1 tablet (80 mg total) by mouth every 6 (six) hours as needed for flatulence. (Patient not taking: Reported on 08/24/2018) 30 tablet 1   No current facility-administered medications on file prior to visit.    Observations/Objective: Awake, alert, oriented Not in acute distress  Assessment and Plan: 1. Acute non-recurrent sinusitis of other sinus Return to work note provided - amoxicillin-clavulanate (AUGMENTIN) 875-125 MG tablet; Take 1 tablet by mouth 2 (two) times daily.  Dispense: 20 tablet; Refill: 0   Follow Up Instructions: Return  if symptoms worsen or fail to improve.    I discussed the assessment and treatment plan with the patient. The patient was provided an opportunity to ask questions and all were answered. The patient agreed with the plan and demonstrated an understanding of the instructions.   The patient was advised to call back or seek an in-person evaluation if the symptoms worsen or if the condition fails to improve as anticipated.     I provided 12 minutes total of non-face-to-face time during this encounter including median intraservice time,  reviewing previous notes, investigations, ordering medications, medical decision making, coordinating care and patient verbalized understanding at the end of the visit.     Charlott Rakes, MD, FAAFP. [facility] and [facility] [place], [place]   11/03/2019, 9:28 AM

## 2019-11-03 NOTE — Progress Notes (Signed)
States that she has had a runny nose and now she has drainage which is making her cough.  She has used OTC medications nothing is really helping.  Negative covid test.

## 2019-12-21 ENCOUNTER — Telehealth: Payer: Self-pay | Admitting: Family Medicine

## 2019-12-21 NOTE — Telephone Encounter (Signed)
Called patient to let her know per CRM the soonest available appointment we have is going to be 12/30 at [facility]. However, if patient needs to be seen sooner she can go to our mobile medicine unit Wednesday 12/15 at [address]	[place] [phone] or on Thursday at [address] [place].

## 2019-12-22 ENCOUNTER — Encounter (HOSPITAL_COMMUNITY): Payer: Self-pay

## 2019-12-22 ENCOUNTER — Emergency Department (HOSPITAL_COMMUNITY): Payer: Self-pay

## 2019-12-22 ENCOUNTER — Ambulatory Visit: Payer: Self-pay | Admitting: *Deleted

## 2019-12-22 ENCOUNTER — Emergency Department (HOSPITAL_COMMUNITY)
Admission: EM | Admit: 2019-12-22 | Discharge: 2019-12-22 | Disposition: A | Discharge status: Home / Self Care | Payer: Self-pay | Attending: Emergency Medicine | Admitting: Emergency Medicine

## 2019-12-22 DIAGNOSIS — H5789 Other specified disorders of eye and adnexa: Secondary | ICD-10-CM | POA: Insufficient documentation

## 2019-12-22 DIAGNOSIS — R202 Paresthesia of skin: Secondary | ICD-10-CM | POA: Insufficient documentation

## 2019-12-22 DIAGNOSIS — Z91040 Latex allergy status: Secondary | ICD-10-CM | POA: Insufficient documentation

## 2019-12-22 DIAGNOSIS — Z87891 Personal history of nicotine dependence: Secondary | ICD-10-CM | POA: Insufficient documentation

## 2019-12-22 DIAGNOSIS — R079 Chest pain, unspecified: Secondary | ICD-10-CM | POA: Insufficient documentation

## 2019-12-22 LAB — CBC
HCT: 40.8 % (ref 36.0–46.0)
Hemoglobin: 13.3 g/dL (ref 12.0–15.0)
MCH: 26.5 pg (ref 26.0–34.0)
MCHC: 32.6 g/dL (ref 30.0–36.0)
MCV: 81.3 fL (ref 80.0–100.0)
Platelets: 224 10*3/uL (ref 150–400)
RBC: 5.02 MIL/uL (ref 3.87–5.11)
RDW: 13.4 % (ref 11.5–15.5)
WBC: 9.4 10*3/uL (ref 4.0–10.5)
nRBC: 0 % (ref 0.0–0.2)

## 2019-12-22 LAB — LIPASE, BLOOD: Lipase: 23 U/L (ref 11–51)

## 2019-12-22 LAB — COMPREHENSIVE METABOLIC PANEL
ALT: 11 U/L (ref 0–44)
AST: 15 U/L (ref 15–41)
Albumin: 3.9 g/dL (ref 3.5–5.0)
Alkaline Phosphatase: 35 U/L — ABNORMAL LOW (ref 38–126)
Anion gap: 8 (ref 5–15)
BUN: 8 mg/dL (ref 6–20)
CO2: 23 mmol/L (ref 22–32)
Calcium: 9.1 mg/dL (ref 8.9–10.3)
Chloride: 106 mmol/L (ref 98–111)
Creatinine, Ser: 0.8 mg/dL (ref 0.44–1.00)
GFR, Estimated: >60 mL/min (ref >60)
Glucose, Bld: 100 mg/dL — ABNORMAL HIGH (ref 70–99)
Potassium: 4.1 mmol/L (ref 3.5–5.1)
Sodium: 137 mmol/L (ref 135–145)
Total Bilirubin: 0.6 mg/dL (ref 0.3–1.2)
Total Protein: 7.4 g/dL (ref 6.5–8.1)

## 2019-12-22 LAB — I-STAT BETA HCG BLOOD, ED (MC, WL, AP ONLY): I-stat hCG, quantitative: <5 m[IU]/mL (ref <5)

## 2019-12-22 LAB — TROPONIN I (HIGH SENSITIVITY): Troponin I (High Sensitivity): <2 ng/L (ref <18)

## 2019-12-22 MED ORDER — HYDROXYZINE HCL 25 MG PO TABS: 25.0000 mg | ORAL_TABLET | Freq: Four times a day (QID) | ORAL | 0 refills | Status: DC

## 2019-12-22 MED ORDER — ERYTHROMYCIN 5 MG/GM OP OINT: TOPICAL_OINTMENT | OPHTHALMIC | 0 refills | Status: DC

## 2019-12-22 MED ORDER — PANTOPRAZOLE SODIUM 20 MG PO TBEC: 20.0000 mg | DELAYED_RELEASE_TABLET | Freq: Every day | ORAL | 0 refills | Status: DC

## 2019-12-22 MED ORDER — HYDROXYZINE HCL 25 MG PO TABS
25.0000 mg | ORAL_TABLET | Freq: Once | ORAL | Status: AC
  Administered 2019-12-22: 25 mg via ORAL
  Filled 2019-12-22: qty 1

## 2019-12-22 MED ORDER — LIDOCAINE VISCOUS HCL 2 % MT SOLN
15.0000 mL | Freq: Once | OROMUCOSAL | Status: AC
  Administered 2019-12-22: 15 mL via ORAL
  Filled 2019-12-22: qty 15

## 2019-12-22 MED ORDER — ALUM & MAG HYDROXIDE-SIMETH 200-200-20 MG/5ML PO SUSP
30.0000 mL | Freq: Once | ORAL | Status: AC
  Administered 2019-12-22: 30 mL via ORAL
  Filled 2019-12-22: qty 30

## 2019-12-22 NOTE — Discharge Instructions (Addendum)
Your work-up today was reassuring.  You may always return to the emergency department for any new or concerning symptoms as we discussed.  For your chest pain I recommend continuing your medications as prescribed which includes your reflux medicine.  I have also provided you with a refill of Protonix.  This is been sent to your pharmacy.  Please continue to use your Vistaril for anxiety.  This is also called Atarax.  I have also refilled this medication for you.  Given that you have had reflux for some time and are now having the sensation of having something stuck in your throat I have high suspicion for a esophageal web or stricture.  This is something that can be followed by a gastroenterologist in the office.  I will defer to them for further evaluation for this possibility.  Again, if your symptoms worsen or change you may always return to ER for reevaluation.  As for your left eye, please use warm compresses. If symptoms worsen you may use the ointment I have printed for you.

## 2019-12-22 NOTE — ED Provider Notes (Signed)
I assumed care of patient at shift change from previous team, please see their note for full H&P.  Briefly patient is here for evaluation of multiple complaints Physical Exam  BP 110/75 (BP Location: Right Arm)   Pulse 93   Temp 98.7 F (37.1 C) (Oral)   Resp 16   LMP 12/21/2019   SpO2 100%   Labs Reviewed  COMPREHENSIVE METABOLIC PANEL - Abnormal; Notable for the following components:      Result Value   Glucose, Bld 100 (*)    Alkaline Phosphatase 35 (*)    All other components within normal limits  CBC  LIPASE, BLOOD  I-STAT BETA HCG BLOOD, ED ([facility], [facility], [facility] ONLY)  TROPONIN I (HIGH SENSITIVITY)    DG Chest 2 View  Result Date: 12/22/2019 CLINICAL DATA:  Chest pain EXAM: CHEST - 2 VIEW COMPARISON:  None. FINDINGS: The heart size and mediastinal contours are within normal limits. Both lungs are clear. The visualized skeletal structures are unremarkable. IMPRESSION: No active cardiopulmonary disease. Electronically Signed   By: Inez Catalina M.D.   On: 12/22/2019 10:10     MDM  Plan is to follow-up on labs.  Labs obtained and reviewed, are reassuring.  Troponin is negative, no need for second troponin.    Labs reviewed without significant abnormalities or cause for patient's symptoms found. I reevaluated patient, she states that she feels calm, the weird feeling in her throat has resolved and she wishes for discharge home at this time.  She is sitting comfortably on the bed in no obvious distress.  We discussed the importance of follow-up with PCP and she states her understanding.  Return precautions were discussed with patient who states their understanding.  At the time of discharge patient denied any unaddressed complaints or concerns.  Patient is agreeable for discharge home.  Note: Portions of this report may have been transcribed using voice recognition software. Every effort was made to ensure accuracy; however, inadvertent computerized transcription errors may be  present       Lorin Glass, PA-C 12/22/19 1542    Hayden Rasmussen, MD 12/23/19 1149

## 2019-12-22 NOTE — ED Triage Notes (Signed)
Pt has multiple complaints: Pt reports chest pain, left eye redness and feeling like there is something in it. Pt reports left hand numbness/tingling yesterday but right hand numbness/tingling today. ?Pt now reports she thinks something is stuck in her throat. States her anxiety has been bad lately. Pt a.o, resp e.u

## 2019-12-22 NOTE — ED Provider Notes (Signed)
[place] EMERGENCY DEPARTMENT Provider Note   CSN: [id number] Arrival date & time: 12/22/19  8841     History Chief Complaint  Patient presents with  . Numbness  . Chest Pain  . Eye Problem    AMAANI Barber is a 34 y.o. female.  HPI Patient is 34 year old female with no pertinent past medical history apart from anemia, bipolar, depression, scoliosis  She is presenting today with 3 symptoms.  She states that she has right arm paresthesias that began yesterday.  She also has chest pain has been ongoing for approximately 1.5 weeks which is achy, nonradiating, sternal, nonexertional, nonpleuritic.  She denies any associated cough shortness of breath fevers chills nausea or vomiting.  She also states that her left eye is somewhat red appearing today.  She noticed this in the mirror today.  She denies any discharge from her eye.  No vision changes.  She states it felt transiently itchy at one point.  She has tried nothing including eyedrops or warm compresses yet.  She has no history of or exposure to pinkeye.  She is on Nexplanon as birth control.  She was prescribed estrogen containing medicine for her periods however she never has taken this and states she is certainly not on it currently.  No history of VTE, no recent surgery, immobilization, long travel, splinting.  No hemoptysis unilateral leg swelling or shortness of breath.  She is a non-smoker.  No other associated symptoms.  No aggravating mitigating factors.    Past Medical History:  Diagnosis Date  . Abnormal Pap smear   . Anemia   . Bipolar 1 disorder ([place])   . Blood dyscrasia    Sickle Cell Trait  . Depression   . Mental disorder    bipolar, schizophrenia  . Schizophrenia ([place])   . Scoliosis   . Sickle cell trait [facility])     Patient Active Problem List   Diagnosis Date Noted  . Diabetes mellitus screening 03/12/2016  . Tension headache 05/21/2013  . 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  Depression   . Blood dyscrasia   . Bipolar 1 disorder ([place])   . Schizophrenia [facility])     Past Surgical History:  Procedure Laterality Date  . FEMORAL OSTEOTOMY W/ RODDING  2004   s/p accident , leg fx  . LAPAROSCOPIC CHOLECYSTECTOMY     during second pregnancy     OB History    Gravida  5   Para  4   Term  4   Preterm  0   AB  1   Living  4     SAB  1   IAB  0   Ectopic  0   Multiple  0   Live Births  4           Family History  Problem Relation Age of Onset  . Cancer Mother   . Kidney disease Mother   . Alcohol abuse Mother   . Mental illness Mother   . Cancer Maternal Grandmother     Social History   Tobacco Use  . Smoking status: Former Smoker    Packs/day: 0.25    Years: 11.00    Pack years: 2.75    Types: E-cigarettes  . Smokeless tobacco: Never Used  Substance Use Topics  . Alcohol use: No    Comment: pt denies smoking, drinking or drug use  . Drug use: No    Types: Marijuana    Comment: Pt had +  UDS during pregnancy; Pt denied during admission history     Home Medications Prior to Admission medications   Medication Sig Start Date End Date Taking? Authorizing Provider  amoxicillin-clavulanate (AUGMENTIN) 875-125 MG tablet Take 1 tablet by mouth 2 (two) times daily. 11/03/19   Charlott Rakes, MD  cetirizine (ZYRTEC) 10 MG tablet Take 1 tablet (10 mg total) by mouth daily. 03/29/19   Charlott Rakes, MD  clotrimazole (LOTRIMIN) 1 % cream Apply 1 application topically 2 (two) times daily. Patient not taking: Reported on 03/29/2019 08/24/18   Charlott Rakes, MD  erythromycin ophthalmic ointment Place a 1/2 inch ribbon of ointment into the lower eyelid twice daily for 7 days 12/22/19   Tedd Sias, PA  estradiol (ESTRACE) 1 MG tablet TAKE 1 TABLET BY MOUTH EVERY DAY 09/23/19   [name], MD  ferrous sulfate (FERROUSUL) 325 (65 FE) MG tablet Take 1 tablet (325 mg total) by mouth 3 (three) times daily with meals. 10/01/10 10/01/11   Melony Overly, MD  hydrOXYzine (ATARAX/VISTARIL) 25 MG tablet Take 1 tablet (25 mg total) by mouth every 6 (six) hours. 12/22/19   Tedd Sias, PA  lamoTRIgine (LAMICTAL) 25 MG tablet Take 25 mg by mouth daily. Patient not taking: Reported on 08/16/2019    [provider]  pantoprazole (PROTONIX) 20 MG tablet Take 1 tablet (20 mg total) by mouth daily. 12/22/19   Tedd Sias, PA  simethicone (MYLICON) 80 MG chewable tablet Chew 1 tablet (80 mg total) by mouth every 6 (six) hours as needed for flatulence. Patient not taking: Reported on 08/24/2018 04/14/18   Gildardo Pounds, NP  sodium chloride (OCEAN) 0.65 % SOLN nasal spray Place 1 spray into both nostrils as needed for congestion. Reported on 06/02/2015    [provider]    Allergies    Bee venom, Benadryl [diphenhydramine], Codeine, and Latex  Review of Systems   Review of Systems  Constitutional: Negative for chills, fatigue and fever.  HENT: Negative for congestion.   Eyes: Positive for redness. Negative for pain and itching.  Respiratory: Negative for cough and shortness of breath.   Cardiovascular: Positive for chest pain. Negative for leg swelling.  Gastrointestinal: Negative for abdominal pain, diarrhea, nausea and vomiting.       "Reflux "  Genitourinary: Negative for dysuria.  Musculoskeletal: Negative for myalgias.  Skin: Negative for rash.  Neurological: Negative for dizziness and headaches.    Physical Exam Updated Vital Signs BP 110/75 (BP Location: Right Arm)   Pulse 93   Temp 98.7 F (37.1 C) (Oral)   Resp 16   LMP 12/21/2019   SpO2 100%   Physical Exam Vitals and nursing note reviewed.  Constitutional:      General: She is not in acute distress.    Comments: Pleasant well-appearing 34 year old.  In no acute distress.  Sitting comfortably in bed.  Able answer questions appropriately follow commands. No increased work of breathing. Speaking in full sentences.  HENT:     Head:  Normocephalic and atraumatic.     Nose: Nose normal.     Mouth/Throat:     Mouth: Mucous membranes are moist.  Eyes:     General: No scleral icterus. Cardiovascular:     Rate and Rhythm: Normal rate and regular rhythm.     Pulses: Normal pulses.     Heart sounds: Normal heart sounds.     Comments: Heart rate 84.  Bilateral radial artery pulses 3+ and symmetric. Pulmonary:  Effort: Pulmonary effort is normal. No respiratory distress.     Breath sounds: Normal breath sounds. No wheezing.     Comments: Lungs clear in all fields.  No tachypnea or increased work of breathing. Abdominal:     Palpations: Abdomen is soft.     Tenderness: There is no abdominal tenderness. There is no right CVA tenderness, left CVA tenderness, guarding or rebound.  Musculoskeletal:     Cervical back: Normal range of motion.     Right lower leg: No edema.     Left lower leg: No edema.     Comments: No bilateral or unilateral lower extremity swelling edema or calf tenderness.  Skin:    General: Skin is warm and dry.     Capillary Refill: Capillary refill takes less than 2 seconds.  Neurological:     Mental Status: She is alert. Mental status is at baseline.  Psychiatric:     Comments: Patient is somewhat anxious appearing.  Denies SI, HI, AVH     ED Results / Procedures / Treatments   Labs (all labs ordered are listed, but only abnormal results are displayed) Labs Reviewed  CBC  COMPREHENSIVE METABOLIC PANEL  LIPASE, BLOOD  I-STAT BETA HCG BLOOD, ED ([facility], [facility], [facility] ONLY)  TROPONIN I (HIGH SENSITIVITY)    EKG None  Radiology DG Chest 2 View  Result Date: 12/22/2019 CLINICAL DATA:  Chest pain EXAM: CHEST - 2 VIEW COMPARISON:  None. FINDINGS: The heart size and mediastinal contours are within normal limits. Both lungs are clear. The visualized skeletal structures are unremarkable. IMPRESSION: No active cardiopulmonary disease. Electronically Signed   By: Inez Catalina M.D.   On: 12/22/2019 10:10     Procedures Procedures (including critical care time)  Medications Ordered in ED Medications  hydrOXYzine (ATARAX/VISTARIL) tablet 25 mg (25 mg Oral Given 12/22/19 1453)  alum & mag hydroxide-simeth (MAALOX/MYLANTA) 200-200-20 MG/5ML suspension 30 mL (30 mLs Oral Given 12/22/19 1453)    And  lidocaine (XYLOCAINE) 2 % viscous mouth solution 15 mL (15 mLs Oral Given 12/22/19 1453)    ED Course  I have reviewed the triage vital signs and the nursing notes.  Pertinent labs & imaging results that were available during my care of the patient were reviewed by me and considered in my medical decision making (see chart for details).    MDM Rules/Calculators/A&P                          Patient is 34 year old female with no pertinent past medical history besides reflux presented today with symptoms of strange sensation in her chest which she has has not to call pain but states that it feels abnormal.  This is been ongoing for approximately week and a half.  Her symptoms are not changed today.  She did have some numbness in her right hand when she was driving which prompted her to come to the ER.  This is improved at this time.  She also has an exam which is notable for no numbness that she has good sensation in all aspects of both hands.  Good strength and a normal neurologic exam.  Therefore I suspect she is actually experiencing paresthesias.  This may be because of the positioning of her hands while driving.  EKG is normal sinus rhythm with very minimally perceptible right axis deviation, good R wave progression.  Patient is noted to have S1Q3T3 with no comparison EKG.  Discussed the case my  attending physician who is in agreement with my plan.  We discussed the low likelihood of this representing PE.  Notably patient is prescribed estrogen containing medicine however she has never taken this because "somebody told me I would get cancer ".  Patient is PERC negative and has no risk factors  for VTE such as personal history of VTE, history of coagulopathy, cancer, she has had no hemoptysis, unilateral leg swelling, recent travel, immobilization, exogenous hormone use surgeries or trauma.  Furthermore she has been without any tachycardia, Tachypnea or low SPO2.  Suspect the patient's symptoms are related to reflux as she has had longstanding reflux and is now having foreign body sensation in her esophagus without any difficulty swallowing or tolerating p.o. I have higher suspicion for esophageal web or stricture of some kind.  She will need to follow-up with gastroenterology for this.  Will recommend continued PPI and return precautions.  I have low suspicion for bacterial infection and patient has no eye discharge and has the faintest injection in the medial aspect of her left sclera.  No itching or pain.  Will recommend warm compresses to the eye and follow-up with her PCP.  She will is return for recheck.  At time of shift change troponin, lipase, CMP pending.  I-STAT hCG is negative for pregnancy and CBC was without leukocytosis or anemia.  Anticipate discharge home once labs are resulted however this will be at the discretion of oncoming provider.  Care transferred to [facility]. PA-C  Final Clinical Impression(s) / ED Diagnoses Final diagnoses:  Paresthesia  Chest pain, unspecified type  Irritation of left eye    Rx / DC Orders ED Discharge Orders         Ordered    erythromycin ophthalmic ointment        12/22/19 1509    hydrOXYzine (ATARAX/VISTARIL) 25 MG tablet  Every 6 hours        12/22/19 1525    pantoprazole (PROTONIX) 20 MG tablet  Daily        12/22/19 [place], Keniyah Gelinas [place], [place] 12/22/19 1530    Fredia Sorrow, MD 12/23/19 [phone]

## 2019-12-22 NOTE — Telephone Encounter (Signed)
Pt is currently in the ED to be evaluated.

## 2019-12-22 NOTE — Telephone Encounter (Signed)
Last week I'm feeling bloated like my period is coming.    I got my period yesterday.   My neck area and throat feels like food is stuck in it.   My chest area has shooting pain into my neck and down my arm on the arm.   It's a period ache feel.   I've having sensation of numbness in both of my hands.   Happened yesterday too.  This has happened before.   I was told GERD was causing it.   I've been taking Pepcid.   I'm at the doctor's office now in parking lot.   I know it's my GERD.  I just burped. I was reading box it says it not to take it due to chest pain.  Pt called in with multiple symptoms.  I let her know the shooting pains into her neck and the aching in her arms and numbness in her hands is not associated with GERD.   There are no appt at [facility].   I let her know about the mobile unit on [address] in [place] that was put in a MyChart note to her yesterday from [place].   She's not familiar with [facility] and didn't know where [place]. Was. I encouraged her to go to the ED due to her symptoms.    "I just passed [facility] coming here to [facility] office".   I instructed her to go on to the ED and get her symptoms checked out. "I'm just nervous about all of this I'm feeling".   I reassured her and let her know that's why it's better to go on to the ED due to her symptoms and she is close by. She agreed to go to the ED at [facility].  No protocol was started as she called in c/o multiple symptoms.   See above notes.  I used my nursing judgement based on her symptoms and situation with no appts at [facility].       Reason for Disposition . Nursing judgment    Had multiple complaints not indicating any particular protocol.  Sent to the ED.  Protocols used: NO GUIDELINE OR REFERENCE AVAILABLE-A-AH

## 2020-01-03 ENCOUNTER — Emergency Department (HOSPITAL_COMMUNITY)
Admission: EM | Admit: 2020-01-03 | Discharge: 2020-01-03 | Disposition: A | Discharge status: Home / Self Care | Payer: Medicaid Other | Attending: Emergency Medicine | Admitting: Emergency Medicine

## 2020-01-03 ENCOUNTER — Encounter (HOSPITAL_COMMUNITY): Payer: Self-pay

## 2020-01-03 DIAGNOSIS — L299 Pruritus, unspecified: Secondary | ICD-10-CM

## 2020-01-03 DIAGNOSIS — Z87891 Personal history of nicotine dependence: Secondary | ICD-10-CM | POA: Insufficient documentation

## 2020-01-03 DIAGNOSIS — L659 Nonscarring hair loss, unspecified: Secondary | ICD-10-CM

## 2020-01-03 DIAGNOSIS — L639 Alopecia areata, unspecified: Secondary | ICD-10-CM | POA: Insufficient documentation

## 2020-01-03 DIAGNOSIS — Z91040 Latex allergy status: Secondary | ICD-10-CM | POA: Insufficient documentation

## 2020-01-03 DIAGNOSIS — L290 Pruritus ani: Secondary | ICD-10-CM | POA: Insufficient documentation

## 2020-01-03 MED ORDER — KETOCONAZOLE 1 % EX SHAM: 1.0000 "application " | MEDICATED_SHAMPOO | Freq: Every day | CUTANEOUS | 0 refills | Status: DC

## 2020-01-03 NOTE — Discharge Instructions (Signed)
Go to the store and get some head and shoulders or Selsun Blue to see if this helps with itching in your scalp.  If that does not help you can fill the ketoconazole shampoo prescription.  If that does not help follow-up with your primary care provider for referral.

## 2020-01-03 NOTE — ED Provider Notes (Signed)
[place] EMERGENCY DEPARTMENT Provider Note   CSN: [id number] Arrival date & time: 01/03/20  [id number]     History Chief Complaint  Patient presents with  . Allergic Reaction    Jennifer Barber is a 34 y.o. female.  Patient's had scalp itching and hair loss.  Has a history of the same.  She is a, hematologist, and applied a wig with glue, she is done this before but not had reaction.  She said itching.  No other symptoms of allergic reaction to include no shortness of breath no vomiting, no respiratory difficulties no rash.  She has had fungal infection or scalp before.  She is concerned that could be going on.        Past Medical History:  Diagnosis Date  . Abnormal Pap smear   . Anemia   . Bipolar 1 disorder ([place])   . Blood dyscrasia    Sickle Cell Trait  . Depression   . Mental disorder    bipolar, schizophrenia  . Schizophrenia ([place])   . Scoliosis   . Sickle cell trait [facility])     Patient Active Problem List   Diagnosis Date Noted  . Diabetes mellitus screening 03/12/2016  . Tension headache 05/21/2013  . 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 Depression   . Blood dyscrasia   . Bipolar 1 disorder ([place])   . Schizophrenia [facility])     Past Surgical History:  Procedure Laterality Date  . FEMORAL OSTEOTOMY W/ RODDING  2004   s/p accident , leg fx  . LAPAROSCOPIC CHOLECYSTECTOMY     during second pregnancy     OB History    Gravida  5   Para  4   Term  4   Preterm  0   AB  1   Living  4     SAB  1   IAB  0   Ectopic  0   Multiple  0   Live Births  4           Family History  Problem Relation Age of Onset  . Cancer Mother   . Kidney disease Mother   . Alcohol abuse Mother   . Mental illness Mother   . Cancer Maternal Grandmother     Social History   Tobacco Use  . Smoking status: Former Smoker    Packs/day: 0.25    Years: 11.00    Pack years: 2.75    Types: E-cigarettes  . Smokeless tobacco: Never Used   Substance Use Topics  . Alcohol use: No    Comment: pt denies smoking, drinking or drug use  . Drug use: No    Types: Marijuana    Comment: Pt had + UDS during pregnancy; Pt denied during admission history     Home Medications Prior to Admission medications   Medication Sig Start Date End Date Taking? Authorizing Provider  KETOCONAZOLE, TOPICAL, 1 % SHAM Apply 1 application topically daily. 01/03/20  Yes Breck Coons, MD  amoxicillin-clavulanate (AUGMENTIN) 875-125 MG tablet Take 1 tablet by mouth 2 (two) times daily. 11/03/19   Charlott Rakes, MD  cetirizine (ZYRTEC) 10 MG tablet Take 1 tablet (10 mg total) by mouth daily. 03/29/19   Charlott Rakes, MD  clotrimazole (LOTRIMIN) 1 % cream Apply 1 application topically 2 (two) times daily. Patient not taking: Reported on 03/29/2019 08/24/18   Charlott Rakes, MD  erythromycin ophthalmic ointment Place a 1/2 inch ribbon of ointment into the lower eyelid twice daily  for 7 days 12/22/19   Tedd Sias, PA  estradiol (ESTRACE) 1 MG tablet TAKE 1 TABLET BY MOUTH EVERY DAY 09/23/19   [name], MD  ferrous sulfate (FERROUSUL) 325 (65 FE) MG tablet Take 1 tablet (325 mg total) by mouth 3 (three) times daily with meals. 10/01/10 10/01/11  Melony Overly, MD  hydrOXYzine (ATARAX/VISTARIL) 25 MG tablet Take 1 tablet (25 mg total) by mouth every 6 (six) hours. 12/22/19   Tedd Sias, PA  lamoTRIgine (LAMICTAL) 25 MG tablet Take 25 mg by mouth daily. Patient not taking: Reported on 08/16/2019    [provider]  pantoprazole (PROTONIX) 20 MG tablet Take 1 tablet (20 mg total) by mouth daily. 12/22/19   Tedd Sias, PA  simethicone (MYLICON) 80 MG chewable tablet Chew 1 tablet (80 mg total) by mouth every 6 (six) hours as needed for flatulence. Patient not taking: Reported on 08/24/2018 04/14/18   Gildardo Pounds, NP  sodium chloride (OCEAN) 0.65 % SOLN nasal spray Place 1 spray into both nostrils as needed for congestion. Reported  on 06/02/2015    [provider]    Allergies    Bee venom, Benadryl [diphenhydramine], Codeine, and Latex  Review of Systems   Review of Systems  Constitutional: Negative for chills and fever.  HENT: Negative for congestion and rhinorrhea.   Respiratory: Negative for cough and shortness of breath.   Cardiovascular: Negative for chest pain and palpitations.  Gastrointestinal: Negative for diarrhea, nausea and vomiting.  Genitourinary: Negative for difficulty urinating and dysuria.  Musculoskeletal: Negative for arthralgias and back pain.  Skin: Negative for rash and wound.       Hair loss and itching  Neurological: Negative for light-headedness and headaches.    Physical Exam Updated Vital Signs BP 121/76 (BP Location: Right Arm)   Pulse 80   Temp 98.2 F (36.8 C) (Oral)   Resp 16   LMP 12/21/2019   SpO2 100%   Physical Exam Vitals and nursing note reviewed. Exam conducted with a chaperone present.  Constitutional:      General: She is not in acute distress.    Appearance: Normal appearance.  HENT:     Head: Normocephalic and atraumatic.     Comments: Bitemporal areas of hair thinning, no scale no erythema, no stubble, no tenderness to palpation no fluctuance no drainage, no occipital or any cervical lymphadenopathy    Nose: No rhinorrhea.  Eyes:     General:        Right eye: No discharge.        Left eye: No discharge.     Conjunctiva/sclera: Conjunctivae normal.  Cardiovascular:     Rate and Rhythm: Normal rate and regular rhythm.  Pulmonary:     Effort: Pulmonary effort is normal. No respiratory distress.     Breath sounds: No stridor.  Abdominal:     General: Abdomen is flat. There is no distension.     Palpations: Abdomen is soft.  Musculoskeletal:        General: No tenderness or signs of injury.  Skin:    General: Skin is warm and dry.  Neurological:     General: No focal deficit present.     Mental Status: She is alert. Mental status is at  baseline.     Motor: No weakness.  Psychiatric:        Mood and Affect: Mood normal.        Behavior: Behavior normal.  ED Results / Procedures / Treatments   Labs (all labs ordered are listed, but only abnormal results are displayed) Labs Reviewed - No data to display  EKG None  Radiology No results found.  Procedures Procedures (including critical care time)  Medications Ordered in ED Medications - No data to display  ED Course  I have reviewed the triage vital signs and the nursing notes.  Pertinent labs & imaging results that were available during my care of the patient were reviewed by me and considered in my medical decision making (see chart for details).    MDM Rules/Calculators/A&P                          Possible fungal versus contact versus traction alopecia.  Patient has had this in the past.  Recommend over-the-counter shampoo to try with itching.  Return precautions provided.  Hand prescription for ketoconazole shampoo provided she says this helped in the past.  She might need referral to outpatient providers for further evaluation.  No signs of life-threatening infection or illness. Final Clinical Impression(s) / ED Diagnoses Final diagnoses:  Itching  Alopecia    Rx / DC Orders ED Discharge Orders         Ordered    KETOCONAZOLE, TOPICAL, 1 % SHAM  Daily        01/03/20 1052           Sabino Donovan, MD 01/03/20 1054

## 2020-01-03 NOTE — ED Triage Notes (Addendum)
Pt reports she is here today due to possible allergic reaction. Pt reports she started taking Protonix and increase itching in her head. Pt reports x1 week of itching. Pt reports she is unsure if its the medication or if she is having an allergic reaction to new weave. Pt reports she had something like this happen in the past and she was prescribed a hair wash and it helped.

## 2020-01-05 ENCOUNTER — Encounter: Payer: Self-pay | Admitting: Family Medicine

## 2020-01-14 ENCOUNTER — Encounter: Payer: Self-pay | Admitting: Family Medicine

## 2020-01-19 ENCOUNTER — Telehealth: Payer: Self-pay | Admitting: Family Medicine

## 2020-01-19 NOTE — Telephone Encounter (Signed)
Copied from [place] [id number]. Topic: Quick Communication - See Telephone Encounter >> Jan 18, 2020 10:58 AM [place] wrote: CRM for notification. See Telephone encounter for: 01/18/20. Copy of pt message stating Dr Margarita Rana suggesting earlier appt tried to reach to office for suggestion, FU with appt  @ [phone] pt wants more immediate than I can give.  Please call the Clinic to schedule an appointment. You might be seen by any Clinician who has an opening to avoid a long wait.  -Dr Margarita Rana  Last read by Lucrezia Starch Hecht at 10:30 AM on 01/18/2020.  Mckelvy, Lucrezia Starch to Charlott Rakes, MD      8:30 AM Good morning...I been trying to not complain as much, but I can tell that there is something goin on in my throat area..I'm not having any acid reflux, but I drunk a ginger Ale yesterday and there was this egg smell like taste coming from my mouth, it's constantly dry. And I'm experiencing some kinda of feeling like I'm dizzy, but a little slight ache under my right shoulder blade..don't knw what this mean.Marland KitchenMarland KitchenI'm burping pretty good, but I can tell something w

## 2020-01-20 NOTE — Telephone Encounter (Signed)
Pt has been set an appointment and sent a mychart message informing her of the appointment.

## 2020-01-27 ENCOUNTER — Other Ambulatory Visit: Payer: Self-pay

## 2020-01-27 ENCOUNTER — Ambulatory Visit (HOSPITAL_COMMUNITY)
Admission: EM | Admit: 2020-01-27 | Discharge: 2020-01-27 | Disposition: A | Discharge status: Home / Self Care | Payer: Medicaid Other | Attending: Emergency Medicine | Admitting: Emergency Medicine

## 2020-01-27 ENCOUNTER — Encounter (HOSPITAL_COMMUNITY): Payer: Self-pay

## 2020-01-27 DIAGNOSIS — H5710 Ocular pain, unspecified eye: Secondary | ICD-10-CM | POA: Diagnosis not present

## 2020-01-27 DIAGNOSIS — F32A Depression, unspecified: Secondary | ICD-10-CM | POA: Insufficient documentation

## 2020-01-27 DIAGNOSIS — M419 Scoliosis, unspecified: Secondary | ICD-10-CM | POA: Diagnosis not present

## 2020-01-27 DIAGNOSIS — Z885 Allergy status to narcotic agent status: Secondary | ICD-10-CM | POA: Diagnosis not present

## 2020-01-27 DIAGNOSIS — Z793 Long term (current) use of hormonal contraceptives: Secondary | ICD-10-CM | POA: Insufficient documentation

## 2020-01-27 DIAGNOSIS — Z91040 Latex allergy status: Secondary | ICD-10-CM | POA: Diagnosis not present

## 2020-01-27 DIAGNOSIS — D509 Iron deficiency anemia, unspecified: Secondary | ICD-10-CM | POA: Insufficient documentation

## 2020-01-27 DIAGNOSIS — Z87891 Personal history of nicotine dependence: Secondary | ICD-10-CM | POA: Insufficient documentation

## 2020-01-27 DIAGNOSIS — B349 Viral infection, unspecified: Secondary | ICD-10-CM | POA: Diagnosis not present

## 2020-01-27 DIAGNOSIS — Z20822 Contact with and (suspected) exposure to covid-19: Secondary | ICD-10-CM | POA: Diagnosis not present

## 2020-01-27 DIAGNOSIS — Z888 Allergy status to other drugs, medicaments and biological substances status: Secondary | ICD-10-CM | POA: Insufficient documentation

## 2020-01-27 DIAGNOSIS — R079 Chest pain, unspecified: Secondary | ICD-10-CM

## 2020-01-27 NOTE — Discharge Instructions (Signed)
Schedule an appointment with your primary care provider next week.    Your COVID test is pending.  You should self quarantine until the test result is back.  Take Tylenol or ibuprofen as needed for fever or discomfort.  Rest and keep yourself hydrated.

## 2020-01-27 NOTE — ED Triage Notes (Signed)
Pt c/o redness on both eyes x 3 days. Pt states she has been having pressure in her chest. Pt states she used a chemical in her hair that might've cause an allergic reaction. She states she has been having redness around her face and her face has been puffy. She states her eyes have been sensitive to light and she states she has been unable to sleep. Pt states she feels discomfort on the right side of the body.

## 2020-01-27 NOTE — ED Provider Notes (Signed)
[facility]    CSN: [id number] Arrival date & time: 01/27/20  1122      History   Chief Complaint Chief Complaint  Patient presents with  . Chest Pain  . Eye Pain    HPI Jennifer Barber is a 35 y.o. female.   Patient presents with 3-day history of difficulty sleeping, headache, eye redness, chest pressure on the right side, and fatigue.  She also reports anxiety.  She states she feels like she may be having an allergic reaction inside her head to a hair product.  She denies eye drainage, eye pain, fever, chills, cough, shortness of breath, vomiting, diarrhea, or other symptoms.  No treatments attempted at home.  Her medical history includes depression, bipolar 1 disorder, schizophrenia, scoliosis, anemia.  The history is provided by the patient and medical records.    Past Medical History:  Diagnosis Date  . Abnormal Pap smear   . Anemia   . Bipolar 1 disorder ([place])   . Blood dyscrasia    Sickle Cell Trait  . Depression   . Mental disorder    bipolar, schizophrenia  . Schizophrenia ([place])   . Scoliosis   . Sickle cell trait [facility])     Patient Active Problem List   Diagnosis Date Noted  . Diabetes mellitus screening 03/12/2016  . Tension headache 05/21/2013  . 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 Depression   . Blood dyscrasia   . Bipolar 1 disorder ([place])   . Schizophrenia [facility])     Past Surgical History:  Procedure Laterality Date  . FEMORAL OSTEOTOMY W/ RODDING  2004   s/p accident , leg fx  . LAPAROSCOPIC CHOLECYSTECTOMY     during second pregnancy    OB History    Gravida  5   Para  4   Term  4   Preterm  0   AB  1   Living  4     SAB  1   IAB  0   Ectopic  0   Multiple  0   Live Births  4            Home Medications    Prior to Admission medications   Medication Sig Start Date End Date Taking? Authorizing Provider  amoxicillin-clavulanate (AUGMENTIN) 875-125 MG tablet Take 1 tablet by mouth 2 (two) times daily. 11/03/19    Charlott Rakes, MD  cetirizine (ZYRTEC) 10 MG tablet Take 1 tablet (10 mg total) by mouth daily. 03/29/19   Charlott Rakes, MD  clotrimazole (LOTRIMIN) 1 % cream Apply 1 application topically 2 (two) times daily. Patient not taking: Reported on 03/29/2019 08/24/18   Charlott Rakes, MD  erythromycin ophthalmic ointment Place a 1/2 inch ribbon of ointment into the lower eyelid twice daily for 7 days 12/22/19   Tedd Sias, PA  estradiol (ESTRACE) 1 MG tablet TAKE 1 TABLET BY MOUTH EVERY DAY 09/23/19   [name], MD  ferrous sulfate (FERROUSUL) 325 (65 FE) MG tablet Take 1 tablet (325 mg total) by mouth 3 (three) times daily with meals. 10/01/10 10/01/11  Melony Overly, MD  hydrOXYzine (ATARAX/VISTARIL) 25 MG tablet Take 1 tablet (25 mg total) by mouth every 6 (six) hours. 12/22/19   [place], Ova Fresh[place] S, PA  KETOCONAZOLE, TOPICAL, 1 % SHAM Apply 1 application topically daily. 01/03/20   Breck Coons, MD  lamoTRIgine (LAMICTAL) 25 MG tablet Take 25 mg by mouth daily. Patient not taking: Reported on 08/16/2019    [provider]  pantoprazole (PROTONIX) 20 MG tablet Take 1 tablet (20 mg total) by mouth daily. 12/22/19   Tedd Sias, PA  simethicone (MYLICON) 80 MG chewable tablet Chew 1 tablet (80 mg total) by mouth every 6 (six) hours as needed for flatulence. Patient not taking: Reported on 08/24/2018 04/14/18   Gildardo Pounds, NP  sodium chloride (OCEAN) 0.65 % SOLN nasal spray Place 1 spray into both nostrils as needed for congestion. Reported on 06/02/2015    [provider]    Family History Family History  Problem Relation Age of Onset  . Cancer Mother   . Kidney disease Mother   . Alcohol abuse Mother   . Mental illness Mother   . Cancer Maternal Grandmother     Social History Social History   Tobacco Use  . Smoking status: Former Smoker    Packs/day: 0.25    Years: 11.00    Pack years: 2.75    Types: E-cigarettes  . Smokeless tobacco: Never Used   Substance Use Topics  . Alcohol use: No    Comment: pt denies smoking, drinking or drug use  . Drug use: No    Types: Marijuana    Comment: Pt had + UDS during pregnancy; Pt denied during admission history      Allergies   Bee venom, Benadryl [diphenhydramine], Codeine, and Latex   Review of Systems Review of Systems  Constitutional: Positive for fatigue. Negative for chills and fever.  HENT: Negative for ear pain and sore throat.   Eyes: Positive for redness. Negative for pain, discharge, itching and visual disturbance.  Respiratory: Negative for cough and shortness of breath.   Cardiovascular: Positive for chest pain. Negative for palpitations.  Gastrointestinal: Negative for abdominal pain, diarrhea and vomiting.  Genitourinary: Negative for dysuria and hematuria.  Musculoskeletal: Negative for arthralgias and back pain.  Skin: Negative for color change and rash.  Neurological: Positive for headaches. Negative for seizures and syncope.  Psychiatric/Behavioral: Positive for sleep disturbance. The patient is nervous/anxious.   All other systems reviewed and are negative.    Physical Exam Triage Vital Signs ED Triage Vitals [01/27/20 1311]  Enc Vitals Group     BP      Pulse      Resp      Temp      Temp src      SpO2      Weight      Height      Head Circumference      Peak Flow      Pain Score 0     Pain Loc      Pain Edu?      Excl. in [place]?    No data found.  Updated Vital Signs BP 125/62 (BP Location: Right Arm)   Pulse 86   Temp 98.9 F (37.2 C) (Oral)   Resp 18   LMP 01/10/2020 (Approximate)   SpO2 100%   Visual Acuity Right Eye Distance: 20/25 (With correction ) Left Eye Distance: 20/25 (With correction ) Bilateral Distance: 20/25 (With correction )  Right Eye Near:   Left Eye Near:    Bilateral Near:     Physical Exam Vitals and nursing note reviewed.  Constitutional:      General: She is not in acute distress.    Appearance: She is  well-developed and well-nourished. She is not ill-appearing.  HENT:     Head: Normocephalic and atraumatic.     Right Ear: Tympanic membrane  normal.     Left Ear: Tympanic membrane normal.     Nose: Nose normal.     Mouth/Throat:     Mouth: Mucous membranes are moist.     Pharynx: Oropharynx is clear.  Eyes:     General: Lids are normal.     Extraocular Movements: Extraocular movements intact.     Conjunctiva/sclera: Conjunctivae normal.     Pupils: Pupils are equal, round, and reactive to light.  Cardiovascular:     Rate and Rhythm: Normal rate and regular rhythm.     Heart sounds: Normal heart sounds.  Pulmonary:     Effort: Pulmonary effort is normal. No respiratory distress.     Breath sounds: Normal breath sounds.  Abdominal:     Palpations: Abdomen is soft.     Tenderness: There is no abdominal tenderness. There is no guarding or rebound.  Musculoskeletal:        General: No edema.     Cervical back: Neck supple.  Skin:    General: Skin is warm and dry.     Findings: No rash.  Neurological:     General: No focal deficit present.     Mental Status: She is alert and oriented to person, place, and time.     Gait: Gait normal.      UC Treatments / Results  Labs (all labs ordered are listed, but only abnormal results are displayed) Labs Reviewed  SARS CORONAVIRUS 2 (TAT 6-24 HRS)    EKG   Radiology No results found.  Procedures Procedures (including critical care time)  Medications Ordered in UC Medications - No data to display  Initial Impression / Assessment and Plan / UC Course  I have reviewed the triage vital signs and the nursing notes.  Pertinent labs & imaging results that were available during my care of the patient were reviewed by me and considered in my medical decision making (see chart for details).   Chest pain.  Viral illness.  EKG shows sinus rhythm, rate 68, no ST elevation, compared to previous from December 2021.  COVID pending.   Instructed patient to self quarantine until the test results are back.  Discussed symptomatic treatment including Tylenol, rest, hydration.  Instructed patient to follow up with PCP next week.  Patient agrees to plan of care.    Final Clinical Impressions(s) / UC Diagnoses   Final diagnoses:  Chest pain, unspecified type  Viral illness     Discharge Instructions     Schedule an appointment with your primary care provider next week.    Your COVID test is pending.  You should self quarantine until the test result is back.  Take Tylenol or ibuprofen as needed for fever or discomfort.  Rest and keep yourself hydrated.          ED Prescriptions    None     PDMP not reviewed this encounter.   Sharion Balloon, NP 01/27/20 1407

## 2020-01-28 ENCOUNTER — Encounter: Payer: Self-pay | Admitting: Family Medicine

## 2020-01-28 LAB — SARS CORONAVIRUS 2 (TAT 6-24 HRS): SARS Coronavirus 2: NEGATIVE

## 2020-02-08 ENCOUNTER — Ambulatory Visit: Payer: Self-pay | Attending: Family Medicine | Admitting: Family Medicine

## 2020-02-08 ENCOUNTER — Other Ambulatory Visit: Payer: Self-pay

## 2020-02-08 ENCOUNTER — Encounter: Payer: Self-pay | Admitting: Family Medicine

## 2020-02-08 VITALS — BP 114/73 | HR 87 | Ht 60.0 in | Wt 136.0 lb

## 2020-02-08 DIAGNOSIS — L249 Irritant contact dermatitis, unspecified cause: Secondary | ICD-10-CM

## 2020-02-08 DIAGNOSIS — J329 Chronic sinusitis, unspecified: Secondary | ICD-10-CM

## 2020-02-08 DIAGNOSIS — L659 Nonscarring hair loss, unspecified: Secondary | ICD-10-CM

## 2020-02-08 MED ORDER — PREDNISONE 10 MG PO TABS: 10.0000 mg | ORAL_TABLET | Freq: Every day | ORAL | 0 refills | Status: DC

## 2020-02-08 NOTE — Progress Notes (Signed)
States that pain in throat has resolved. States that she is coughing up mucus. Has itching in scalp.

## 2020-02-08 NOTE — Progress Notes (Signed)
Subjective:  Patient ID: Jennifer Barber, female    DOB: [birth year]  Age: 35 y.o. MRN: [id number]  CC: Sore Throat   HPI Jennifer Barber presents with the following concerns. She had requested an appointment due to sore throat after  Testing negative for COVID. Her throat pain has resolved but she has the presence of mucus. Currently using Flonase and Zyrtec which had been prescribed.  Complains of itching in the scalp and went to the ED for this where she was prescribed Ketoconazole Shampoo which she is yet to pick up. She has been using Selsum blue with no relief. It feels like she has something her hair and itching happens in different areas of her head. Her neck and anterior hairline itches, sometimes burns. Her hair has falling out especially on the sides. She had a quick weave done herself and used hair glue not long ago.  Past Medical History:  Diagnosis Date  . Abnormal Pap smear   . Anemia   . Bipolar 1 disorder ([place])   . Blood dyscrasia    Sickle Cell Trait  . Depression   . Mental disorder    bipolar, schizophrenia  . Schizophrenia ([place])   . Scoliosis   . Sickle cell trait [facility])     Past Surgical History:  Procedure Laterality Date  . FEMORAL OSTEOTOMY W/ RODDING  2004   s/p accident , leg fx  . LAPAROSCOPIC CHOLECYSTECTOMY     during second pregnancy    Family History  Problem Relation Age of Onset  . Cancer Mother   . Kidney disease Mother   . Alcohol abuse Mother   . Mental illness Mother   . Cancer Maternal Grandmother     Allergies  Allergen Reactions  . Bee Venom Swelling  . Benadryl [Diphenhydramine] Swelling  . Codeine Itching  . Latex Rash    Outpatient Medications Prior to Visit  Medication Sig Dispense Refill  . amoxicillin-clavulanate (AUGMENTIN) 875-125 MG tablet Take 1 tablet by mouth 2 (two) times daily. (Patient not taking: Reported on 02/08/2020) 20 tablet 0  . cetirizine (ZYRTEC) 10 MG tablet Take 1 tablet (10 mg total) by mouth daily.  30 tablet 1  . clotrimazole (LOTRIMIN) 1 % cream Apply 1 application topically 2 (two) times daily. (Patient not taking: No sig reported) 30 g 0  . erythromycin ophthalmic ointment Place a 1/2 inch ribbon of ointment into the lower eyelid twice daily for 7 days (Patient not taking: Reported on 02/08/2020) 3.5 g 0  . estradiol (ESTRACE) 1 MG tablet TAKE 1 TABLET BY MOUTH EVERY DAY (Patient not taking: Reported on 02/08/2020) 90 tablet 2  . ferrous sulfate (FERROUSUL) 325 (65 FE) MG tablet Take 1 tablet (325 mg total) by mouth 3 (three) times daily with meals. 30 tablet 3  . hydrOXYzine (ATARAX/VISTARIL) 25 MG tablet Take 1 tablet (25 mg total) by mouth every 6 (six) hours. (Patient not taking: Reported on 02/08/2020) 12 tablet 0  . KETOCONAZOLE, TOPICAL, 1 % SHAM Apply 1 application topically daily. (Patient not taking: Reported on 02/08/2020) 200 mL 0  . lamoTRIgine (LAMICTAL) 25 MG tablet Take 25 mg by mouth daily. (Patient not taking: No sig reported)    . pantoprazole (PROTONIX) 20 MG tablet Take 1 tablet (20 mg total) by mouth daily. (Patient not taking: Reported on 02/08/2020) 14 tablet 0  . simethicone (MYLICON) 80 MG chewable tablet Chew 1 tablet (80 mg total) by mouth every 6 (six) hours as needed for flatulence. (  Patient not taking: No sig reported) 30 tablet 1  . sodium chloride (OCEAN) 0.65 % SOLN nasal spray Place 1 spray into both nostrils as needed for congestion. Reported on 06/02/2015 (Patient not taking: Reported on 02/08/2020)     No facility-administered medications prior to visit.     ROS Review of Systems  Constitutional: Negative for activity change, appetite change and fatigue.  HENT: Negative for congestion, sinus pressure and sore throat.   Eyes: Negative for visual disturbance.  Respiratory: Negative for cough, chest tightness, shortness of breath and wheezing.   Cardiovascular: Negative for chest pain and palpitations.  Gastrointestinal: Negative for abdominal distention,  abdominal pain and constipation.  Endocrine: Negative for polydipsia.  Genitourinary: Negative for dysuria and frequency.  Musculoskeletal: Negative for arthralgias and back pain.  Skin: Negative for rash.  Neurological: Negative for tremors, light-headedness and numbness.  Hematological: Does not bruise/bleed easily.  Psychiatric/Behavioral: Negative for agitation and behavioral problems.    Objective:  BP 114/73   Pulse 87   Ht 5' (1.524 m)   Wt 136 lb (61.7 kg)   LMP 01/10/2020 (Approximate)   SpO2 100%   BMI 26.56 kg/m   BP/Weight 02/08/2020 01/27/2020 40/98/1191  Systolic BP 478 295 621  Diastolic BP 73 62 77  Wt. (Lbs) 136 - -  BMI 26.56 - -      Physical Exam Constitutional:      Appearance: She is well-developed.  Neck:     Vascular: No JVD.  Cardiovascular:     Rate and Rhythm: Normal rate.     Heart sounds: Normal heart sounds. No murmur heard.   Pulmonary:     Effort: Pulmonary effort is normal.     Breath sounds: Normal breath sounds. No wheezing or rales.  Chest:     Chest wall: No tenderness.  Abdominal:     General: Bowel sounds are normal. There is no distension.     Palpations: Abdomen is soft. There is no mass.     Tenderness: There is no abdominal tenderness.  Musculoskeletal:        General: Normal range of motion.     Right lower leg: No edema.     Left lower leg: No edema.  Skin:    Comments: Hair loss on bilateral temporal regions  Neurological:     Mental Status: She is alert and oriented to person, place, and time.  Psychiatric:        Mood and Affect: Mood normal.     CMP Latest Ref Rng & Units 12/22/2019 08/16/2019 08/24/2018  Glucose 70 - 99 mg/dL 100(H) 95 75  BUN 6 - 20 mg/dL 8 8 10   Creatinine 0.44 - 1.00 mg/dL 0.80 0.92 0.84  Sodium 135 - 145 mmol/L 137 139 143  Potassium 3.5 - 5.1 mmol/L 4.1 3.9 4.0  Chloride 98 - 111 mmol/L 106 105 106  CO2 22 - 32 mmol/L 23 23 26   Calcium 8.9 - 10.3 mg/dL 9.1 9.7 9.6  Total Protein 6.5  - 8.1 g/dL 7.4 7.4 7.0  Total Bilirubin 0.3 - 1.2 mg/dL 0.6 0.5 0.3  Alkaline Phos 38 - 126 U/L 35(L) 46(L) 39  AST 15 - 41 U/L 15 13 11   ALT 0 - 44 U/L 11 11 13     Lipid Panel     Component Value Date/Time   CHOL 155 08/16/2019 1116   TRIG 39 08/16/2019 1116   HDL 53 08/16/2019 1116   CHOLHDL 2.9 08/16/2019 1116   CHOLHDL 3.0  05/08/2015 1132   VLDL 7 05/08/2015 1132   LDLCALC 93 08/16/2019 1116    CBC    Component Value Date/Time   WBC 9.4 12/22/2019 1442   RBC 5.02 12/22/2019 1442   HGB 13.3 12/22/2019 1442   HGB 13.2 08/16/2019 1116   HCT 40.8 12/22/2019 1442   HCT 42.3 08/16/2019 1116   PLT 224 12/22/2019 1442   PLT 256 08/16/2019 1116   MCV 81.3 12/22/2019 1442   MCV 85 08/16/2019 1116   MCH 26.5 12/22/2019 1442   MCHC 32.6 12/22/2019 1442   RDW 13.4 12/22/2019 1442   RDW 12.6 08/16/2019 1116   LYMPHSABS 2.9 08/16/2019 1116   MONOABS 330 05/08/2015 1132   EOSABS 0.1 08/16/2019 1116   BASOSABS 0.1 08/16/2019 1116    Lab Results  Component Value Date   HGBA1C 5.0 03/12/2016    Assessment & Plan:  1. Alopecia of scalp Likely secondary to contact dermatitis from irritation of weave and glue Placed on short course of Prednisone Use ketoconazole shampoo  2. Acute irritant contact dermatitis See #1 above - predniSONE (DELTASONE) 10 MG tablet; Take 1 tablet (10 mg total) by mouth daily with breakfast.  Dispense: 5 tablet; Refill: 0  3. Other sinusitis, unspecified chronicity Improved Continue Flonase and Zyrtec   Meds ordered this encounter  Medications  . predniSONE (DELTASONE) 10 MG tablet    Sig: Take 1 tablet (10 mg total) by mouth daily with breakfast.    Dispense:  5 tablet    Refill:  0    Follow-up: Return in about 1 month (around 03/07/2020) for alopecia.       Charlott Rakes, MD, FAAFP. [employer] [facility] [place], [place]   02/08/2020, 2:50 PM

## 2020-02-15 ENCOUNTER — Encounter: Payer: Self-pay | Admitting: Family Medicine

## 2020-03-09 ENCOUNTER — Ambulatory Visit: Payer: Medicaid Other | Admitting: Family Medicine

## 2020-04-18 ENCOUNTER — Ambulatory Visit: Payer: Medicaid Other | Admitting: Family Medicine

## 2020-09-06 ENCOUNTER — Ambulatory Visit (INDEPENDENT_AMBULATORY_CARE_PROVIDER_SITE_OTHER): Payer: Medicaid Other | Admitting: Advanced Practice Midwife

## 2020-09-06 ENCOUNTER — Encounter: Payer: Self-pay | Admitting: Advanced Practice Midwife

## 2020-09-06 ENCOUNTER — Other Ambulatory Visit: Payer: Self-pay

## 2020-09-06 VITALS — BP 127/76 | HR 79 | Ht 60.0 in | Wt 135.5 lb

## 2020-09-06 DIAGNOSIS — Z30017 Encounter for initial prescription of implantable subdermal contraceptive: Secondary | ICD-10-CM | POA: Insufficient documentation

## 2020-09-06 DIAGNOSIS — Z3046 Encounter for surveillance of implantable subdermal contraceptive: Secondary | ICD-10-CM

## 2020-09-06 MED ORDER — ETONOGESTREL 68 MG ~~LOC~~ IMPL
68.0000 mg | DRUG_IMPLANT | Freq: Once | SUBCUTANEOUS | Status: AC
  Administered 2020-09-06: 68 mg via SUBCUTANEOUS

## 2020-09-06 NOTE — Progress Notes (Signed)
[place] AND RE-INSERTION Patient name: Jennifer Barber MRN [id number]  Date of birth: 1985/06/30 Subjective Findings:   Jennifer Barber is a 35 y.o. [id number] African American female being seen today for Nexplanon removal and re-insertion. Her Nexplanon was placed Aug 2019.   No LMP recorded. Last pap Aug 2021. Results were: NILM w/ HRHPV negative  Risks/benefits/side effects of Nexplanon have been discussed and her questions have been answered.  Specifically, a failure rate of 01/998 has been reported, with an increased failure rate if pt takes [place] and/or antiseizure medicaitons.  She is aware of the common side effect of irregular bleeding, which the incidence of decreases over time. Signed copy of informed consent in chart.   Depression screen [facility] 2/9 02/08/2020 08/16/2019 03/29/2019 08/24/2018 10/28/2017  Decreased Interest 0 0 0 0 0  Down, Depressed, Hopeless 0 0 0 0 0  PHQ - 2 Score 0 0 0 0 0  Altered sleeping 2 0 0 0 0  Tired, decreased energy 2 0 2 0 0  Change in appetite 0 0 1 0 0  Feeling bad or failure about yourself  0 0 0 0 0  Trouble concentrating 0 0 0 0 0  Moving slowly or fidgety/restless 0 0 0 0 0  Suicidal thoughts 0 0 0 0 0  PHQ-9 Score 4 0 3 0 0  Some recent data might be hidden     GAD 7 : Generalized Anxiety Score 02/08/2020 08/16/2019 03/29/2019 03/29/2019  Nervous, Anxious, on Edge 0 0 0 0  Control/stop worrying 2 0 0 0  Worry too much - different things 2 0 0 0  Trouble relaxing 2 0 1 0  Restless 0 0 0 0  Easily annoyed or irritable 0 0 0 0  Afraid - awful might happen 0 0 0 0  Total GAD 7 Score 6 0 1 0  Anxiety Difficulty - - - -     Pertinent History Reviewed:   Reviewed past medical,surgical, social, obstetrical and family history.  Reviewed problem list, medications and allergies. Objective Findings & Procedure:    Vitals:   09/06/20 1129  BP: 127/76  Pulse: 79  Weight: 135 lb 8 oz (61.5 kg)  Height: 5' (1.524 m)  Body mass index is  26.46 kg/m.  No results found for this or any previous visit (from the past 24 hour(s)).   Time out was performed.  Nexplanon site identified.  Area prepped in usual sterile fashon. Two cc's of 2% lidocaine was used to anesthetize the area. A small stab incision was made right beside the implant on the distal portion.  The Nexplanon rod was grasped using hemostats and removed intact without difficulty.  The area was cleansed again with betadine and the Nexplanon was inserted approximately 10cm from the medial epicondyle and 3-5cm posterior to the sulcus per manufacturer's recommendations without difficulty.  Steri-strips and a pressure bandage was applied.  There was less than 3 cc blood loss. There were no complications.  The patient tolerated the procedure well. Assessment & Plan:   1) Nexplanon removal & re-insertion She was instructed to keep the area clean and dry, remove pressure bandage in 24 hours, and keep insertion site covered with the steri-strips for 3-5 days.  She was given a card indicating date Nexplanon was inserted and date it needs to be removed.  Follow-up PRN problems.  EXP [id number]  No orders of the defined types were placed in this encounter.  Follow-up: Return in about 1 year (around 09/06/2021) for Physical.  Myrtis Ser CNM 09/06/2020 1:03 PM

## 2020-09-06 NOTE — Addendum Note (Signed)
Addended by: Octaviano Glow on: 09/06/2020 02:21 PM   Modules accepted: Orders

## 2020-09-13 ENCOUNTER — Ambulatory Visit: Payer: Medicaid Other | Admitting: Physician Assistant

## 2020-11-06 ENCOUNTER — Encounter: Payer: Medicaid Other | Admitting: Family Medicine

## 2020-11-09 ENCOUNTER — Ambulatory Visit: Payer: Medicaid Other | Admitting: Adult Health

## 2020-11-16 ENCOUNTER — Ambulatory Visit: Payer: Medicaid Other | Admitting: Adult Health

## 2020-11-29 ENCOUNTER — Encounter: Payer: Medicaid Other | Admitting: Family Medicine

## 2021-01-16 ENCOUNTER — Encounter: Payer: Self-pay | Admitting: Family Medicine

## 2021-01-16 ENCOUNTER — Ambulatory Visit: Payer: Medicaid Other | Attending: Physician Assistant | Admitting: Family Medicine

## 2021-01-16 DIAGNOSIS — R142 Eructation: Secondary | ICD-10-CM | POA: Diagnosis not present

## 2021-01-16 DIAGNOSIS — J328 Other chronic sinusitis: Secondary | ICD-10-CM

## 2021-01-16 DIAGNOSIS — Z13228 Encounter for screening for other metabolic disorders: Secondary | ICD-10-CM

## 2021-01-16 MED ORDER — PREDNISONE 20 MG PO TABS: 20.0000 mg | ORAL_TABLET | Freq: Every day | ORAL | 0 refills | Status: DC

## 2021-01-16 MED ORDER — FLUTICASONE PROPIONATE 50 MCG/ACT NA SUSP: 2.0000 | Freq: Every day | NASAL | 1 refills | Status: DC

## 2021-01-16 NOTE — Progress Notes (Signed)
Virtual Visit via Video Note  I connected with Jennifer Barber, on 01/16/2021 at 3:05 PM by video enabled telemedicine device due to the COVID-19 pandemic and verified that I am speaking with the correct person using two identifiers.   Consent: I discussed the limitations, risks, security and privacy concerns of performing an evaluation and management service by telemedicine and the availability of in person appointments. I also discussed with the patient that there may be a patient responsible charge related to this service. The patient expressed understanding and agreed to proceed.   Location of Patient: Home  Location of Provider: Clinic   Persons participating in Telemedicine visit: Lucrezia Starch Stemmler Dr. Margarita Rana     History of Present Illness: Jennifer Barber is a 36 y.o. year old female seen for an office visit today.   She has had locking of the R side of her jaw for which she saw her dentist. She feels like she has a chronic sinus infection and has a hard time getting the mucus out. She has pain in her upper right back and feels something is trapped in her shoulder blade and runs up her neck. On moving her head from side to side she has right sided neck pain. She is doing Claritin and this has not been helpful  She has also been burping and passing gas along with having some reflux symptoms.  Not taking OTC PPI.  Sometime in 03/2019 she was diagnosed with Helicobacter pylori gastritis.  Last year her H. pylori test was negative. Past Medical History:  Diagnosis Date   Abnormal Pap smear    Anemia    Bipolar 1 disorder (HCC)    Blood dyscrasia    Sickle Cell Trait   Depression    Mental disorder    bipolar, schizophrenia   Schizophrenia (HCC)    Scoliosis    Sickle cell trait (HCC)    Allergies  Allergen Reactions   Bee Venom Swelling   Benadryl [Diphenhydramine] Swelling   Codeine Itching   Latex Rash    Current Outpatient Medications on File Prior to Visit   Medication Sig Dispense Refill   cetirizine (ZYRTEC) 10 MG tablet Take 1 tablet (10 mg total) by mouth daily. 30 tablet 1   clotrimazole (LOTRIMIN) 1 % cream Apply 1 application topically 2 (two) times daily. (Patient not taking: No sig reported) 30 g 0   estradiol (ESTRACE) 1 MG tablet TAKE 1 TABLET BY MOUTH EVERY DAY (Patient not taking: Reported on 02/08/2020) 90 tablet 2   ferrous sulfate (FERROUSUL) 325 (65 FE) MG tablet Take 1 tablet (325 mg total) by mouth 3 (three) times daily with meals. 30 tablet 3   hydrOXYzine (ATARAX/VISTARIL) 25 MG tablet Take 1 tablet (25 mg total) by mouth every 6 (six) hours. (Patient not taking: Reported on 02/08/2020) 12 tablet 0   KETOCONAZOLE, TOPICAL, 1 % SHAM Apply 1 application topically daily. (Patient not taking: Reported on 02/08/2020) 200 mL 0   lamoTRIgine (LAMICTAL) 25 MG tablet Take 25 mg by mouth daily. (Patient not taking: No sig reported)     pantoprazole (PROTONIX) 20 MG tablet Take 1 tablet (20 mg total) by mouth daily. (Patient not taking: Reported on 02/08/2020) 14 tablet 0   simethicone (MYLICON) 80 MG chewable tablet Chew 1 tablet (80 mg total) by mouth every 6 (six) hours as needed for flatulence. (Patient not taking: No sig reported) 30 tablet 1   No current facility-administered medications on file prior to visit.  ROS: See HPI  Observations/Objective: General-alert, awake, oriented x3 ENT-no nasal congestion, no sinus tenderness Neck-tenderness on lateral rotation of head to the left Respiration-normal Skin-normal Psych-normal  CMP Latest Ref Rng & Units 12/22/2019 08/16/2019 08/24/2018  Glucose 70 - 99 mg/dL 100(H) 95 75  BUN 6 - 20 mg/dL 8 8 10   Creatinine 0.44 - 1.00 mg/dL 0.80 0.92 0.84  Sodium 135 - 145 mmol/L 137 139 143  Potassium 3.5 - 5.1 mmol/L 4.1 3.9 4.0  Chloride 98 - 111 mmol/L 106 105 106  CO2 22 - 32 mmol/L 23 23 26   Calcium 8.9 - 10.3 mg/dL 9.1 9.7 9.6  Total Protein 6.5 - 8.1 g/dL 7.4 7.4 7.0  Total Bilirubin  0.3 - 1.2 mg/dL 0.6 0.5 0.3  Alkaline Phos 38 - 126 U/L 35(L) 46(L) 39  AST 15 - 41 U/L 15 13 11   ALT 0 - 44 U/L 11 11 13     Lipid Panel     Component Value Date/Time   CHOL 155 08/16/2019 1116   TRIG 39 08/16/2019 1116   HDL 53 08/16/2019 1116   CHOLHDL 2.9 08/16/2019 1116   CHOLHDL 3.0 05/08/2015 1132   VLDL 7 05/08/2015 1132   LDLCALC 93 08/16/2019 1116   LABVLDL 9 08/16/2019 1116    Lab Results  Component Value Date   HGBA1C 5.0 03/12/2016     Assessment and Plan: 1. Other chronic sinusitis - fluticasone (FLONASE) 50 MCG/ACT nasal spray; Place 2 sprays into both nostrils daily.  Dispense: 16 g; Refill: 1 - predniSONE (DELTASONE) 20 MG tablet; Take 1 tablet (20 mg total) by mouth daily with breakfast.  Dispense: 5 tablet; Refill: 0  2. Burping - H. pylori breath test; Future  3. Screening for metabolic disorder - LP+Non-HDL Cholesterol; Future - CMP14+EGFR; Future - CBC with Differential/Platelet; Future - Hemoglobin A1c; Future   Follow Up Instructions: Call to schedule an appointment if symptoms persist or worsen   I discussed the assessment and treatment plan with the patient. The patient was provided an opportunity to ask questions and all were answered. The patient agreed with the plan and demonstrated an understanding of the instructions.   The patient was advised to call back or seek an in-person evaluation if the symptoms worsen or if the condition fails to improve as anticipated.     I provided 16 minutes total of Telehealth time during this encounter including median intraservice time, reviewing previous notes, investigations, ordering medications, medical decision making, coordinating care and patient verbalized understanding at the end of the visit.     Charlott Rakes, MD, FAAFP. [facility], [place]   01/16/2021, 3:05 PM

## 2021-01-17 ENCOUNTER — Other Ambulatory Visit: Payer: Self-pay

## 2021-01-17 ENCOUNTER — Ambulatory Visit: Payer: Medicaid Other | Attending: Family Medicine

## 2021-01-17 DIAGNOSIS — Z13228 Encounter for screening for other metabolic disorders: Secondary | ICD-10-CM

## 2021-01-17 DIAGNOSIS — R142 Eructation: Secondary | ICD-10-CM

## 2021-01-18 LAB — CMP14+EGFR
ALT: 10 IU/L (ref 0–32)
AST: 11 IU/L (ref 0–40)
Albumin/Globulin Ratio: 1.7 (ref 1.2–2.2)
Albumin: 4.5 g/dL (ref 3.8–4.8)
Alkaline Phosphatase: 47 IU/L (ref 44–121)
BUN/Creatinine Ratio: 14 (ref 9–23)
BUN: 12 mg/dL (ref 6–20)
Bilirubin Total: 0.4 mg/dL (ref 0.0–1.2)
CO2: 22 mmol/L (ref 20–29)
Calcium: 9.8 mg/dL (ref 8.7–10.2)
Chloride: 105 mmol/L (ref 96–106)
Creatinine, Ser: 0.87 mg/dL (ref 0.57–1.00)
Globulin, Total: 2.6 g/dL (ref 1.5–4.5)
Glucose: 87 mg/dL (ref 70–99)
Potassium: 4.1 mmol/L (ref 3.5–5.2)
Sodium: 139 mmol/L (ref 134–144)
Total Protein: 7.1 g/dL (ref 6.0–8.5)
eGFR: 88 mL/min/{1.73_m2} (ref >59)

## 2021-01-18 LAB — CBC WITH DIFFERENTIAL/PLATELET
Basophils Absolute: 0.1 10*3/uL (ref 0.0–0.2)
Basos: 1 %
EOS (ABSOLUTE): 0.2 10*3/uL (ref 0.0–0.4)
Eos: 2 %
Hematocrit: 40.6 % (ref 34.0–46.6)
Hemoglobin: 13.1 g/dL (ref 11.1–15.9)
Immature Grans (Abs): 0 10*3/uL (ref 0.0–0.1)
Immature Granulocytes: 0 %
Lymphocytes Absolute: 3.2 10*3/uL — ABNORMAL HIGH (ref 0.7–3.1)
Lymphs: 46 %
MCH: 26.9 pg (ref 26.6–33.0)
MCHC: 32.3 g/dL (ref 31.5–35.7)
MCV: 83 fL (ref 79–97)
Monocytes Absolute: 0.5 10*3/uL (ref 0.1–0.9)
Monocytes: 7 %
Neutrophils Absolute: 3 10*3/uL (ref 1.4–7.0)
Neutrophils: 44 %
Platelets: 272 10*3/uL (ref 150–450)
RBC: 4.87 x10E6/uL (ref 3.77–5.28)
RDW: 13.2 % (ref 11.7–15.4)
WBC: 7 10*3/uL (ref 3.4–10.8)

## 2021-01-18 LAB — HEMOGLOBIN A1C
Est. average glucose Bld gHb Est-mCnc: 105 mg/dL
Hgb A1c MFr Bld: 5.3 % (ref 4.8–5.6)

## 2021-01-18 LAB — H. PYLORI BREATH TEST: H pylori Breath Test: NEGATIVE

## 2021-01-18 LAB — LP+NON-HDL CHOLESTEROL
Cholesterol, Total: 148 mg/dL (ref 100–199)
HDL: 44 mg/dL (ref >39)
LDL Chol Calc (NIH): 94 mg/dL (ref 0–99)
Total Non-HDL-Chol (LDL+VLDL): 104 mg/dL (ref 0–129)
Triglycerides: 47 mg/dL (ref 0–149)
VLDL Cholesterol Cal: 10 mg/dL (ref 5–40)

## 2021-02-01 ENCOUNTER — Ambulatory Visit: Payer: Medicaid Other | Attending: Critical Care Medicine | Admitting: Critical Care Medicine

## 2021-02-01 ENCOUNTER — Encounter: Payer: Self-pay | Admitting: Critical Care Medicine

## 2021-02-01 NOTE — Progress Notes (Signed)
Patient ID: [name], female   DOB: [birth year], 36 y.o.   MRN: [id number] The patient would not answer the phone  Upon chart review the patient is already had the encounter with her primary care provider on 10 January as the urgent care visit occurred in December

## 2021-02-07 ENCOUNTER — Other Ambulatory Visit: Payer: Self-pay | Admitting: Family Medicine

## 2021-02-07 DIAGNOSIS — J328 Other chronic sinusitis: Secondary | ICD-10-CM

## 2021-02-07 NOTE — Telephone Encounter (Signed)
Requested medication (s) are due for refill today: yes  Requested medication (s) are on the active medication list: yes  Last refill:  01/16/21 with 1 refill  Future visit scheduled: yes   Notes to clinic:  Please review for refill. Refill is not delegated per protocol.     Requested Prescriptions  Pending Prescriptions Disp Refills   fluticasone (FLONASE) 50 MCG/ACT nasal spray [Pharmacy Med Name: FLUTICASONE PROP 50 MCG SPRAY] 16 mL 1    Sig: SPRAY 2 SPRAYS INTO EACH NOSTRIL EVERY DAY     Not Delegated - Ear, Nose, and Throat: Nasal Preparations - Corticosteroids Failed - 02/07/2021 11:32 AM      Failed - This refill cannot be delegated      Passed - Valid encounter within last 12 months    Recent Outpatient Visits           6 days ago Erroneous encounter - disregard   [place] Elsie Stain, MD   3 weeks ago Other chronic sinusitis   [place], Enobong, MD   1 year ago Alopecia of scalp   [place], Enobong, MD   1 year ago Acute non-recurrent sinusitis of other sinus   [facility] Charlott Rakes, MD   1 year ago Annual physical exam   [facility] Charlott Rakes, MD

## 2021-05-15 ENCOUNTER — Telehealth: Payer: Self-pay | Admitting: Family Medicine

## 2021-05-15 NOTE — Telephone Encounter (Signed)
Bubba Hales with [facility] called in to inform Dr Margarita Rana that they would need a copy of sleep study when patient has one and also a referral so they can supply sleep appliance patient will need to help her at night. Any questions please call Bubba Hales at Ph# [phone] ?

## 2021-05-15 NOTE — Telephone Encounter (Signed)
Patient was seen today at [facility] phone [phone]. Specialist faxed over office notes and suggested PCP prescribe the lowest dose of Trazodone. Specialist advised patient to have a sleep apnea test stating she grinds and clenches her teeth at night which is tearing her jaw line.   ? ?Please note when office notes are received from [facility], notes were faxed to [phone]. PCP has no available appointments to 7/31 ? ? ?

## 2021-05-16 NOTE — Telephone Encounter (Signed)
Please schedule an office visit to discuss all of this.  My 11:10am on 06/06/2021 is available as that patient was already scheduled for an earlier appointment on 05/21/2021.  Thank you. ?

## 2021-05-16 NOTE — Telephone Encounter (Signed)
Will forward to provider  

## 2021-05-17 NOTE — Telephone Encounter (Signed)
Contacted pt to schedule appointment pt states she needs a Monday because she is off. Pt has been scheduled for June 5 at 950am  ?

## 2021-05-29 ENCOUNTER — Encounter: Payer: Self-pay | Admitting: Adult Health

## 2021-05-29 ENCOUNTER — Ambulatory Visit (INDEPENDENT_AMBULATORY_CARE_PROVIDER_SITE_OTHER): Payer: Medicaid Other | Admitting: Adult Health

## 2021-05-29 VITALS — BP 111/73 | HR 71 | Ht 60.0 in | Wt 143.0 lb

## 2021-05-29 DIAGNOSIS — Z3046 Encounter for surveillance of implantable subdermal contraceptive: Secondary | ICD-10-CM | POA: Diagnosis not present

## 2021-05-29 DIAGNOSIS — Z30011 Encounter for initial prescription of contraceptive pills: Secondary | ICD-10-CM | POA: Insufficient documentation

## 2021-05-29 MED ORDER — LO LOESTRIN FE 1 MG-10 MCG / 10 MCG PO TABS: 1.0000 | ORAL_TABLET | Freq: Every day | ORAL | 11 refills | Status: DC

## 2021-05-29 NOTE — Patient Instructions (Addendum)
Use condoms x 4 weeks, keep clean and dry x 24 hours, no heavy lifting, keep steri strips on x 72 hours, Keep pressure dressing on x 24 hours. Follow up prn problems.  Start OCs today

## 2021-05-29 NOTE — Progress Notes (Signed)
  Subjective:     Patient ID: Jennifer Barber, female   DOB: 23-Dec-1985, 36 y.o.   MRN: [id number]  HPI Jennifer Barber is a 36 year old black female, with DP, [id number] in for nexplanon removal and start OCs, may want to get pregnant late on this year.  Lab Results  Component Value Date   DIAGPAP  08/16/2019    - Negative for intraepithelial lesion or malignancy (NILM)   HPV NOT DETECTED 12/06/2016   [place] Negative 08/16/2019   PCP is Dr Margarita Rana  Review of Systems For nexplanon removal Has had pressure in arm since insertion  Reviewed past medical,surgical, social and family history. Reviewed medications and allergies.     Objective:   Physical Exam BP 111/73 (BP Location: Right Arm, Patient Position: Sitting, Cuff Size: Normal)   Pulse 71   Ht 5' (1.524 m)   Wt 143 lb (64.9 kg)   BMI 27.93 kg/m  consent signed and time out called, Left arm cleansed with betadine, and injected with 1.5 cc 2% lidocaine and waited til numb.Under sterile technique a #11 blade was used to make small vertical incision, and a curved forceps was used to easily remove rod. Steri strips applied. Pressure dressing applied.      [place] - 05/29/21 0936       Pregnancy Intention Screening   Does the patient want to become pregnant in the next year? Unsure    Does the patient's partner want to become pregnant in the next year? Unsure    Would the patient like to discuss contraceptive options today? No      Contraception Wrap Up   Current Method Hormonal Implant    End Method Oral Contraceptive;Pregnant/Seeking Pregnancy    Contraception Counseling Provided No             Assessment:     1. Encounter for Nexplanon removal Use condoms x 4 weeks, keep clean and dry x 24 hours, no heavy lifting, keep steri strips on x 72 hours, Keep pressure dressing on x 24 hours. Follow up prn problems.   2. Encounter for initial prescription of contraceptive pills Will start lo Loestrin today,use condoms for 1 pack When  ready to stop, to get pregnant, take OTC PNV    Plan:     Pap and physical in 1 year

## 2021-06-11 ENCOUNTER — Ambulatory Visit: Payer: Medicaid Other | Admitting: Family Medicine

## 2021-07-05 ENCOUNTER — Ambulatory Visit: Payer: Medicaid Other | Attending: Family Medicine | Admitting: Physician Assistant

## 2021-07-05 ENCOUNTER — Encounter: Payer: Self-pay | Admitting: Physician Assistant

## 2021-07-05 VITALS — BP 126/78 | HR 86 | Wt 141.8 lb

## 2021-07-05 DIAGNOSIS — G4700 Insomnia, unspecified: Secondary | ICD-10-CM

## 2021-07-05 DIAGNOSIS — F458 Other somatoform disorders: Secondary | ICD-10-CM

## 2021-07-05 DIAGNOSIS — F418 Other specified anxiety disorders: Secondary | ICD-10-CM | POA: Diagnosis not present

## 2021-07-05 DIAGNOSIS — Z8639 Personal history of other endocrine, nutritional and metabolic disease: Secondary | ICD-10-CM | POA: Diagnosis not present

## 2021-07-05 DIAGNOSIS — E559 Vitamin D deficiency, unspecified: Secondary | ICD-10-CM | POA: Insufficient documentation

## 2021-07-05 DIAGNOSIS — G4763 Sleep related bruxism: Secondary | ICD-10-CM | POA: Insufficient documentation

## 2021-07-05 DIAGNOSIS — M79602 Pain in left arm: Secondary | ICD-10-CM

## 2021-07-05 DIAGNOSIS — F419 Anxiety disorder, unspecified: Secondary | ICD-10-CM | POA: Diagnosis not present

## 2021-07-05 MED ORDER — TRAZODONE HCL 50 MG PO TABS: 25.0000 mg | ORAL_TABLET | Freq: Every evening | ORAL | 3 refills | Status: DC | PRN

## 2021-07-05 NOTE — Patient Instructions (Signed)
Generalized Anxiety Disorder, Adult ?Generalized anxiety disorder (GAD) is a mental health condition. Unlike normal worries, anxiety related to GAD is not triggered by a specific event. These worries do not fade or get better with time. GAD interferes with relationships, work, and school. ?GAD symptoms can vary from mild to severe. People with severe GAD can have intense waves of anxiety with physical symptoms that are similar to panic attacks. ?What are the causes? ?The exact cause of GAD is not known, but the following are believed to have an impact: ?Differences in natural brain chemicals. ?Genes passed down from parents to children. ?Differences in the way threats are perceived. ?Development and stress during childhood. ?Personality. ?What increases the risk? ?The following factors may make you more likely to develop this condition: ?Being female. ?Having a family history of anxiety disorders. ?Being very shy. ?Experiencing very stressful life events, such as the death of a loved one. ?Having a very stressful family environment. ?What are the signs or symptoms? ?People with GAD often worry excessively about many things in their lives, such as their health and family. Symptoms may also include: ?Mental and emotional symptoms: ?Worrying excessively about natural disasters. ?Fear of being late. ?Difficulty concentrating. ?Fears that others are judging your performance. ?Physical symptoms: ?Fatigue. ?Headaches, muscle tension, muscle twitches, trembling, or feeling shaky. ?Feeling like your heart is pounding or beating very fast. ?Feeling out of breath or like you cannot take a deep breath. ?Having trouble falling asleep or staying asleep, or experiencing restlessness. ?Sweating. ?Nausea, diarrhea, or irritable bowel syndrome (IBS). ?Behavioral symptoms: ?Experiencing erratic moods or irritability. ?Avoidance of new situations. ?Avoidance of people. ?Extreme difficulty making decisions. ?How is this diagnosed? ?This  condition is diagnosed based on your symptoms and medical history. You will also have a physical exam. Your health care provider may perform tests to rule out other possible causes of your symptoms. ?To be diagnosed with GAD, a person must have anxiety that: ?Is out of his or her control. ?Affects several different aspects of his or her life, such as work and relationships. ?Causes distress that makes him or her unable to take part in normal activities. ?Includes at least three symptoms of GAD, such as restlessness, fatigue, trouble concentrating, irritability, muscle tension, or sleep problems. ?Before your health care provider can confirm a diagnosis of GAD, these symptoms must be present more days than they are not, and they must last for 6 months or longer. ?How is this treated? ?This condition may be treated with: ?Medicine. Antidepressant medicine is usually prescribed for long-term daily control. Anti-anxiety medicines may be added in severe cases, especially when panic attacks occur. ?Talk therapy (psychotherapy). Certain types of talk therapy can be helpful in treating GAD by providing support, education, and guidance. Options include: ?Cognitive behavioral therapy (CBT). People learn coping skills and self-calming techniques to ease their physical symptoms. They learn to identify unrealistic thoughts and behaviors and to replace them with more appropriate thoughts and behaviors. ?Acceptance and commitment therapy (ACT). This treatment teaches people how to be mindful as a way to cope with unwanted thoughts and feelings. ?Biofeedback. This process trains you to manage your body's response (physiological response) through breathing techniques and relaxation methods. You will work with a therapist while machines are used to monitor your physical symptoms. ?Stress management techniques. These include yoga, meditation, and exercise. ?A mental health specialist can help determine which treatment is best for you.  Some people see improvement with one type of therapy. However, other people require   a combination of therapies. Follow these instructions at home: Lifestyle Maintain a consistent routine and schedule. Anticipate stressful situations. Create a plan and allow extra time to work with your plan. Practice stress management or self-calming techniques that you have learned from your therapist or your health care provider. Exercise regularly and spend time outdoors. Eat a healthy diet that includes plenty of vegetables, fruits, whole grains, low-fat dairy products, and lean protein. Do not eat a lot of foods that are high in fat, added sugar, or salt (sodium). Drink plenty of water. Avoid alcohol. Alcohol can increase anxiety. Avoid caffeine and certain over-the-counter cold medicines. These may make you feel worse. Ask your pharmacist which medicines to avoid. General instructions Take over-the-counter and prescription medicines only as told by your health care provider. Understand that you are likely to have setbacks. Accept this and be kind to yourself as you persist to take better care of yourself. Anticipate stressful situations. Create a plan and allow extra time to work with your plan. Recognize and accept your accomplishments, even if you judge them as small. Spend time with people who care about you. Keep all follow-up visits. This is important. Where to find more information [place]: https://carter.com/ Substance Abuse and Mental Health Services: ktimeonline.com Contact a health care provider if: Your symptoms do not get better. Your symptoms get worse. You have signs of depression, such as: A persistently sad or irritable mood. Loss of enjoyment in activities that used to bring you joy. Change in weight or eating. Changes in sleeping habits. Get help right away if: You have thoughts about hurting yourself or others. If you ever feel like you may hurt  yourself or others, or have thoughts about taking your own life, get help right away. Go to your nearest emergency department or: Call your local emergency services (911 in the [place] Call a suicide crisis helpline, such as the [place] at (325)887-0215 or 988 in the [place]. This is open 24 hours a day in the [place] Text the Crisis Text Line at 747-638-2390 (in the [place] Summary Generalized anxiety disorder (GAD) is a mental health condition that involves worry that is not triggered by a specific event. People with GAD often worry excessively about many things in their lives, such as their health and family. GAD may cause symptoms such as restlessness, trouble concentrating, sleep problems, frequent sweating, nausea, diarrhea, headaches, and trembling or muscle twitching. A mental health specialist can help determine which treatment is best for you. Some people see improvement with one type of therapy. However, other people require a combination of therapies. This information is not intended to replace advice given to you by your health care provider. Make sure you discuss any questions you have with your health care provider. Document Revised: 07/19/2020 Document Reviewed: 04/16/2020 Elsevier Patient Education  [place]. Temporomandibular Joint Syndrome  Temporomandibular joint syndrome (TMJ syndrome) is a condition that causes pain in the temporomandibular joints. These joints are located near your ears and allow your jaw to open and close. For people with TMJ syndrome, chewing, biting, or other movements of the jaw can be difficult or painful. TMJ syndrome is often mild and goes away within a few weeks. However, sometimes the condition becomes a long-term (chronic) problem. What are the causes? This condition may be caused by: Grinding your teeth or clenching your jaw. Some people do this when they are stressed. Arthritis. An injury to the jaw. A head or neck  injury. Teeth or dentures that are not aligned well. In some cases, the cause of TMJ syndrome may not be known. What are the signs or symptoms? The most common symptom of this condition is aching pain on the side of the head in the area of the TMJ. Other symptoms may include: Pain when moving your jaw, such as when chewing or biting. Not being able to open your jaw all the way. Making a clicking sound when you open your mouth. Headache. Earache. Neck or shoulder pain. How is this diagnosed? This condition may be diagnosed based on: Your symptoms and medical history. A physical exam. Your health care provider may check the range of motion of your jaw. Imaging tests, such as X-rays or an MRI. You may also need to see your dentist, who will check if your teeth and jaw are lined up correctly. How is this treated? TMJ syndrome often goes away on its own. If treatment is needed, it may include: Eating soft foods and applying ice or heat. Medicines to relieve pain or inflammation. Medicines or massage to relax the muscles. A splint, bite plate, or mouthpiece to prevent teeth grinding or jaw clenching. Relaxation techniques or counseling to help reduce stress. A therapy for pain in which an electrical current is applied to the nerves through the skin (transcutaneous electrical nerve stimulation). Acupuncture. This may help to relieve pain. Jaw surgery. This is rarely needed. Follow these instructions at home:  Eating and drinking Eat a soft diet if you are having trouble chewing. Avoid foods that require a lot of chewing. Do not chew gum. General instructions Take over-the-counter and prescription medicines only as told by your health care provider. If directed, put ice on the painful area. To do this: Put ice in a plastic bag. Place a towel between your skin and the bag. Leave the ice on for 20 minutes, 2-3 times a day. Remove the ice if your skin turns bright red. This is very  important. If you cannot feel pain, heat, or cold, you have a greater risk of damage to the area. Apply a warm, wet cloth (warm compress) to the painful area as told. Massage your jaw area and do any jaw stretching exercises as told by your health care provider. If you were given a splint, bite plate, or mouthpiece, wear it as told by your health care provider. Keep all follow-up visits. This is important. Where to find more information [place]: https://avila-olson.com/ Contact a health care provider if: You have trouble eating. You have new or worsening symptoms. Get help right away if: Your jaw locks. Summary Temporomandibular joint syndrome (TMJ syndrome) is a condition that causes pain in the temporomandibular joints. These joints are located near your ears and allow your jaw to open and close. TMJ syndrome is often mild and goes away within a few weeks. However, sometimes the condition becomes a long-term (chronic) problem. Symptoms include an aching pain on the side of the head in the area of the TMJ, pain when chewing or biting, and being unable to open your jaw all the way. You may also make a clicking sound when you open your mouth. TMJ syndrome often goes away on its own. If treatment is needed, it may include medicines to relieve pain, reduce inflammation, or relax the muscles. A splint, bite plate, or mouthpiece may also be used to prevent teeth grinding or jaw clenching. This information is not intended to replace advice given to  you by your health care provider. Make sure you discuss any questions you have with your health care provider. Document Revised: 08/06/2020 Document Reviewed: 08/06/2020 Elsevier Patient Education  [place].

## 2021-07-05 NOTE — Progress Notes (Signed)
Patient ID: Jennifer Barber, female   DOB: 04/24/1985, 37 y.o.   MRN: [id number]   Jennifer Barber, is a 36 y.o. female  OEV:[id number]  [id number]  DOB - 05-10-1985  Chief Complaint  Patient presents with   Insomnia       Subjective:   Jennifer Barber is a 36 y.o. female here today for multiple issues.  She went to the dentist and they diagnosed TMJ and suggested a bite block that was going to cost $800 and recommended low dose trazadone to help with sleep.    She does admit to a fair amount of anxiety and worry.  She has been given hydroxyzine but does not like the way it makes her feel and it gives her a HA. She has been to the ED multiple times with problems related to anxiety.  She is also concerned that she may have sleep apnea bc she sometimes can't catch her breath at night.  No known snoring.    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  She is L hand dominant  No problems updated.  ALLERGIES: Allergies  Allergen Reactions   Bee Venom Swelling   Benadryl [Diphenhydramine] Swelling   Codeine Itching   Latex Rash    PAST MEDICAL HISTORY: Past Medical History:  Diagnosis Date   Abnormal Pap smear    Anemia    Bipolar 1 disorder (HCC)    Blood dyscrasia    Sickle Cell Trait   Depression    Mental disorder    bipolar, schizophrenia   Schizophrenia ([place])    Scoliosis    Sickle cell trait ([place])     MEDICATIONS AT HOME: Prior to Admission medications   Medication Sig Start Date End Date Taking? Authorizing Provider  Multiple Vitamin (MULTIVITAMIN WITH MINERALS) TABS tablet Take 1 tablet by mouth daily.   Yes [provider]  traZODone (DESYREL) 50 MG tablet Take 0.5-1 tablets (25-50 mg total) by mouth at bedtime as needed for sleep. 07/05/21  Yes Ashleyanne Hemmingway, Dionne Bucy, PA-C  cetirizine (ZYRTEC) 10 MG tablet Take 1 tablet (10 mg total) by mouth daily. Patient not taking: Reported on 05/29/2021 03/29/19   Charlott Rakes, MD  ferrous sulfate (FERROUSUL) 325  (65 FE) MG tablet Take 1 tablet (325 mg total) by mouth 3 (three) times daily with meals. 10/01/10 10/01/11  Melony Overly, MD  Norethindrone-Ethinyl Estradiol-Fe Biphas (LO LOESTRIN FE) 1 MG-10 MCG / 10 MCG tablet Take 1 tablet by mouth daily. Take 1 daily by mouth 05/29/21   Derrek Monaco A, NP    ROS: Neg HEENT Neg resp Neg cardiac Neg GI Neg GU Neg neuro  Objective:   Vitals:   07/05/21 1143  BP: 126/78  Pulse: 86  SpO2: 100%  Weight: 141 lb 12.8 oz (64.3 kg)   Exam General appearance : Awake, alert, not in any distress. Speech Clear. Not toxic looking HEENT: Atraumatic and Normocephalic Mouth-posterior pharynx Mallampati 1. Oropharynx and airway wide open Neck: Supple, no JVD. No cervical lymphadenopathy.  Chest: Good air entry bilaterally, CTAB.  No rales/rhonchi/wheezing CVS: S1 S2 regular, no murmurs.  LUE WNL DTR=intact B Extremities: B/L Lower Ext shows no edema, both legs are warm to touch Neurology: Awake alert, and oriented X 3, CN II-XII intact, Non focal Skin: No Rash  Data Review Lab Results  Component Value Date   HGBA1C 5.3 01/17/2021   HGBA1C 5.0 03/12/2016    Assessment & Plan   1. Teeth grinding I  recommended OTC bite block  2. H/O vitamin D deficiency - Vitamin D, 25-hydroxy  3. Anxiety about health Consider SSRI-she will think about it - Thyroid Panel With TSH  4. Left arm pain Normal exam-ibuprofen or tylenol  5. Insomnia, unspecified type - traZODone (DESYREL) 50 MG tablet; Take 0.5-1 tablets (25-50 mg total) by mouth at bedtime as needed for sleep.  Dispense: 30 tablet; Refill: 3 - Thyroid Panel With TSH  Return in about 6 weeks (around 08/16/2021) for with me for follow up on meds. May try SSRI if she is receptive  The patient was given clear instructions to go to ER or return to medical center if symptoms don't improve, worsen or new problems develop. The patient verbalized understanding. The patient was told to call to get lab  results if they haven't heard anything in the next week.      Freeman Caldron, PA-C [facility] and [facility] [place], [place]   07/05/2021, 1:08 PM

## 2021-07-06 LAB — THYROID PANEL WITH TSH
Free Thyroxine Index: 1.8 (ref 1.2–4.9)
T3 Uptake Ratio: 22 % — ABNORMAL LOW (ref 24–39)
T4, Total: 8.2 ug/dL (ref 4.5–12.0)
TSH: 2.05 u[IU]/mL (ref 0.450–4.500)

## 2021-07-06 LAB — VITAMIN D 25 HYDROXY (VIT D DEFICIENCY, FRACTURES): Vit D, 25-Hydroxy: 33.2 ng/mL (ref 30.0–100.0)

## 2021-07-27 ENCOUNTER — Other Ambulatory Visit: Payer: Self-pay | Admitting: Physician Assistant

## 2021-07-27 DIAGNOSIS — G4700 Insomnia, unspecified: Secondary | ICD-10-CM

## 2021-08-20 ENCOUNTER — Ambulatory Visit: Payer: Medicaid Other | Admitting: Family Medicine

## 2021-12-26 ENCOUNTER — Ambulatory Visit: Payer: Medicaid Other | Admitting: Adult Health

## 2022-01-10 ENCOUNTER — Ambulatory Visit: Payer: Medicaid Other | Admitting: Adult Health

## 2022-01-23 ENCOUNTER — Ambulatory Visit: Payer: Medicaid Other | Attending: Physician Assistant | Admitting: Physician Assistant

## 2022-01-23 ENCOUNTER — Encounter: Payer: Self-pay | Admitting: Physician Assistant

## 2022-01-23 ENCOUNTER — Ambulatory Visit: Payer: Self-pay

## 2022-01-23 VITALS — BP 133/84 | HR 83 | Wt 132.4 lb

## 2022-01-23 DIAGNOSIS — R590 Localized enlarged lymph nodes: Secondary | ICD-10-CM | POA: Insufficient documentation

## 2022-01-23 DIAGNOSIS — K219 Gastro-esophageal reflux disease without esophagitis: Secondary | ICD-10-CM

## 2022-01-23 DIAGNOSIS — F458 Other somatoform disorders: Secondary | ICD-10-CM

## 2022-01-23 DIAGNOSIS — N898 Other specified noninflammatory disorders of vagina: Secondary | ICD-10-CM | POA: Diagnosis present

## 2022-01-23 DIAGNOSIS — Z09 Encounter for follow-up examination after completed treatment for conditions other than malignant neoplasm: Secondary | ICD-10-CM

## 2022-01-23 MED ORDER — FLUCONAZOLE 150 MG PO TABS: 150.0000 mg | ORAL_TABLET | Freq: Once | ORAL | 0 refills | Status: AC

## 2022-01-23 MED ORDER — OMEPRAZOLE 20 MG PO CPDR: 20.0000 mg | DELAYED_RELEASE_CAPSULE | Freq: Every day | ORAL | 3 refills | Status: DC

## 2022-01-23 NOTE — Telephone Encounter (Signed)
The patient shares that they have previously been prescribed fluconazole (DIFLUCAN) 150 MG tablet [[id number]] and omeprazole (PRILOSEC) 20 MG capsule [[id number]] by an urgent care   The patient shares that they have previously taken the medications with little effectiveness   The patient has called to confirm that their prescriptions are correct and that the dosage is strong enough   Please contact further if/when possible    Pt. Calling to verify Diflucan and Prilosec were ordered today. Verbalizes understanding that they were ordered. Answer Assessment - Initial Assessment Questions 1. DRUG NAME: "What medicine do you need to have refilled?"     Diflucan, Prilosec 2. REFILLS REMAINING: "How many refills are remaining?" (Note: The label on the medicine or pill bottle will show how many refills are remaining. If there are no refills remaining, then a renewal may be needed.)     N/a 3. EXPIRATION DATE: "What is the expiration date?" (Note: The label states when the prescription will expire, and thus can no longer be refilled.)     N/a 4. PRESCRIBING HCP: "Who prescribed it?" Reason: If prescribed by specialist, call should be referred to that group.     Dr. Margarita Rana 5. SYMPTOMS: "Do you have any symptoms?"     N/a 6. PREGNANCY: "Is there any chance that you are pregnant?" "When was your last menstrual period?"     No  Protocols used: Medication Refill and Renewal Call-A-AH

## 2022-01-23 NOTE — Patient Instructions (Signed)
Jaw Dislocation  A jaw dislocation is when the joint where your jawbones meet moves out of place (gets dislocated). This can hurt a lot. You may have trouble moving your mouth and jaw. What are the causes? A hard, direct hit or injury to the jaw. Opening your mouth too widely. This can happen when: You eat, yawn, laugh, or vomit. You have dental work done. You get medicine by mouth to make you fall asleep (general anesthetic). You have a seizure. You have a kind of side effect of certain medicines. What increases the risk? You have displaced your jaw before. You play contact sports. Your jaw is loose or can move more than normal for most people. You have a condition that makes your jaw ligaments loose. These are tissues that connect bones to each other. What are the signs or symptoms? Symptoms may include: Very bad pain, especially when you try to move your jaw. Not being able to move your jaw all the way or at all. Not being able to close your mouth all the way or at all. Feeling that your teeth do not line up as they normally do when you bite. Drooling. Trouble speaking or swallowing. How is this treated? Having your doctor move your jaw back into place (manual reduction). You may get pain medicine before your doctor moves your jaw. Medicines. A neck brace. A bandage wrapped around your head and jaw. Follow these instructions at home: Eating and drinking Follow instructions from your doctor about what you cannot eat or drink while your jaw heals. You may be asked to: Eat soft foods. Eat liquid foods. Eat food that has been cut into small pieces. For the first several days, make sure to take only small, careful bites. Try not to open your jaw widely when you eat. If you have a brace or bandage: Wear the brace or bandage as told by your doctor. Take it off only as told by your doctor. Check the skin around the brace or bandage every day. Tell your doctor if you see  problems. Loosen the brace or bandage if your jaw feels numb. Keep the brace or bandage clean. If the brace or bandage is not waterproof: Do not let it get wet. Cover it with a watertight covering when you take a bath or shower. Managing pain, stiffness, and swelling If told, put ice on the injured area. To do this: If you have a brace or bandage, take it off as told by your doctor. Put ice in a plastic bag. Place a towel between your skin and the bag. Leave the ice on for 20 minutes, 2-3 times a day. Take off the ice if your skin turns bright red. This is very important. If you cannot feel pain, heat, or cold, you have a greater risk of damage to the area.  Activity Do not open your mouth widely until your doctor says that you can. Rest your jaw as told by your doctor. Avoid activities that are like the one that caused your injury. Try not to open your jaw widely when you laugh or yawn. When you sneeze or yawn, put a hand under your jaw. General instructions Take over-the-counter and prescription medicines only as told by your doctor. Do not take baths, swim, or use a hot tub. Ask your doctor about taking showers or sponge baths. Do not smoke or use any products that contain nicotine or tobacco. If you need help quitting, ask your doctor. Keep all follow-up visits. Contact  a doctor if: Your pain gets worse. Medicines do not help your pain. Get help right away if: Your jaw moves out of place again. It is hard to breathe, eat, or swallow. These symptoms may be an emergency. Get help right away. Call your local emergency services (911 in the [place] Do not wait to see if the symptoms will go away. Do not drive yourself to the hospital. Summary A jaw dislocation is when the joint where your jawbones meet moves out of place (gets dislocated). When your jaw moves out of place, you can have a lot of pain. You may have trouble moving your mouth and jaw. Your doctor may move your jaw back  into place (manual reduction). You may get pain medicine before your jaw gets moved. This information is not intended to replace advice given to you by your health care provider. Make sure you discuss any questions you have with your health care provider. Document Revised: 04/19/2020 Document Reviewed: 04/19/2020 Elsevier Patient Education  [employer].

## 2022-01-23 NOTE — Progress Notes (Signed)
Patient ID: Jennifer Barber, female   DOB: [birth year], 37 y.o.   MRN: [id number]   Jennifer Barber, is a 37 y.o. female  [id number]  [id number]:[id number]  DOB - 11-28-1985  Chief Complaint  Patient presents with   Hospitalization Follow-up    Knot in vaginal area and is having some discharge        Subjective:   Jennifer Barber is a 37 y.o. female here today for inguinal LN and vaginal discharge.  She was seen at [facility] in [place] around 01/16/2022 and prescribed bentyl, doxy, and metronidazole.  Her tests came back neg for chlamydia and GC/trich but they told her to complete the antibiotic.  When she saw them, she said she had a very large lymph node that was painful in her R inguinal region.  The LN has started resolving and is now much smaller and not as tender.  She has not noticed lymph nodes anywhere else on the body.  No fever/night sweats/cough.  She did have a pelvic exam last week.  Last pap 2.5 years ago and normal.  No FH pelvic d/o or cancer.  She does have a personal history of ovarian cysts years ago.    Some indigestion with the antibiotics and constipation secondary to bentyl.  She is feeling much better overall.  Teeth grinding I discussed with her at last visit has improved since she got a bite block.  She does still have some TMJ clicking but much better.    No pain with urination   No problems updated.  ALLERGIES: Allergies  Allergen Reactions   Bee Venom Swelling   Benadryl [Diphenhydramine] Swelling   Codeine Itching   Latex Rash    PAST MEDICAL HISTORY: Past Medical History:  Diagnosis Date   Abnormal Pap smear    Anemia    Bipolar 1 disorder (HCC)    Blood dyscrasia    Sickle Cell Trait   Depression    Mental disorder    bipolar, schizophrenia   Schizophrenia ([place])    Scoliosis    Sickle cell trait ([place])     MEDICATIONS AT HOME: Prior to Admission medications   Medication Sig Start Date End Date Taking? Authorizing Provider  fluconazole  (DIFLUCAN) 150 MG tablet Take 1 tablet (150 mg total) by mouth once for 1 dose. 01/23/22 01/23/22 Yes Wyeth Hoffer, Dionne Bucy, PA-C  omeprazole (PRILOSEC) 20 MG capsule Take 1 capsule (20 mg total) by mouth daily. 01/23/22  Yes Argentina Donovan, PA-C  cetirizine (ZYRTEC) 10 MG tablet Take 1 tablet (10 mg total) by mouth daily. Patient not taking: Reported on 05/29/2021 03/29/19   Charlott Rakes, MD  ferrous sulfate (FERROUSUL) 325 (65 FE) MG tablet Take 1 tablet (325 mg total) by mouth 3 (three) times daily with meals. 10/01/10 10/01/11  Melony Overly, MD  Multiple Vitamin (MULTIVITAMIN WITH MINERALS) TABS tablet Take 1 tablet by mouth daily.    [provider]  Norethindrone-Ethinyl Estradiol-Fe Biphas (LO LOESTRIN FE) 1 MG-10 MCG / 10 MCG tablet Take 1 tablet by mouth daily. Take 1 daily by mouth 05/29/21   Estill Dooms, NP  traZODone (DESYREL) 50 MG tablet TAKE 0.5-1 TABLETS BY MOUTH AT BEDTIME AS NEEDED FOR SLEEP. 07/27/21   Charlott Rakes, MD    ROS: Neg HEENT Neg resp Neg cardiac Neg MS Neg psych Neg neuro  Objective:   Vitals:   01/23/22 1040  BP: 133/84  Pulse: 83  SpO2: 97%  Weight: 132 lb 6.4 oz (60.1  kg)   Exam General appearance : Awake, alert, not in any distress. Speech Clear. Not toxic looking HEENT: Atraumatic and Normocephalic Neck: Supple, no JVD. No cervical lymphadenopathy. No virchows nodes Chest: Good air entry bilaterally, CTAB.  No rales/rhonchi/wheezing CVS: S1 S2 regular, no murmurs.  R inguinal region there is a 2cm semi-firm LN that is not mobile and mildly tender.  Extremities: B/L Lower Ext shows no edema, both legs are warm to touch Neurology: Awake alert, and oriented X 3, CN II-XII intact, Non focal Skin: No Rash  Data Review Lab Results  Component Value Date   HGBA1C 5.3 01/17/2021   HGBA1C 5.0 03/12/2016    Assessment & Plan   1. Vaginal discharge Likely bc of antibiotics - fluconazole (DIFLUCAN) 150 MG tablet; Take 1 tablet (150  mg total) by mouth once for 1 dose.  Dispense: 1 tablet; Refill: 0 - US Pelvis Complete; Future  2. Inguinal lymphadenopathy Believe benign given history-will send for U/S Resolving/improving on antibiotics - CBC with Differential/Platelet - US Pelvis Complete; Future  3. Gastroesophageal reflux disease without esophagitis Due to antibiotics(take mid day) - omeprazole (PRILOSEC) 20 MG capsule; Take 1 capsule (20 mg total) by mouth daily.  Dispense: 30 capsule; Refill: 3  4. Encounter for examination following treatment at hospital   5. Teeth grinding Stable/some improvement with night guard/bite block    Return in about 6 months (around 07/24/2022), or if symptoms worsen or fail to improve.  The patient was given clear instructions to go to ER or return to medical center if symptoms don't improve, worsen or new problems develop. The patient verbalized understanding. The patient was told to call to get lab results if they haven't heard anything in the next week.      Freeman Caldron, PA-C [facility] and [facility] [place], [place]   01/23/2022, 11:33 AM

## 2022-01-24 ENCOUNTER — Other Ambulatory Visit: Payer: Self-pay | Admitting: Physician Assistant

## 2022-01-24 DIAGNOSIS — K219 Gastro-esophageal reflux disease without esophagitis: Secondary | ICD-10-CM

## 2022-01-24 DIAGNOSIS — F458 Other somatoform disorders: Secondary | ICD-10-CM

## 2022-01-24 DIAGNOSIS — Z09 Encounter for follow-up examination after completed treatment for conditions other than malignant neoplasm: Secondary | ICD-10-CM

## 2022-01-24 DIAGNOSIS — R590 Localized enlarged lymph nodes: Secondary | ICD-10-CM

## 2022-01-24 LAB — CBC WITH DIFFERENTIAL/PLATELET
Basophils Absolute: 0.1 10*3/uL (ref 0.0–0.2)
Basos: 1 %
EOS (ABSOLUTE): 0 10*3/uL (ref 0.0–0.4)
Eos: 0 %
Hematocrit: 41.2 % (ref 34.0–46.6)
Hemoglobin: 13.5 g/dL (ref 11.1–15.9)
Immature Grans (Abs): 0 10*3/uL (ref 0.0–0.1)
Immature Granulocytes: 0 %
Lymphocytes Absolute: 2.4 10*3/uL (ref 0.7–3.1)
Lymphs: 29 %
MCH: 27.4 pg (ref 26.6–33.0)
MCHC: 32.8 g/dL (ref 31.5–35.7)
MCV: 84 fL (ref 79–97)
Monocytes Absolute: 0.7 10*3/uL (ref 0.1–0.9)
Monocytes: 9 %
Neutrophils Absolute: 5.1 10*3/uL (ref 1.4–7.0)
Neutrophils: 61 %
Platelets: 261 10*3/uL (ref 150–450)
RBC: 4.92 x10E6/uL (ref 3.77–5.28)
RDW: 13 % (ref 11.7–15.4)
WBC: 8.4 10*3/uL (ref 3.4–10.8)

## 2022-01-25 ENCOUNTER — Telehealth: Payer: Self-pay

## 2022-01-25 NOTE — Telephone Encounter (Signed)
Pt was called and is aware of results, DOB was confirmed.  ?

## 2022-01-25 NOTE — Telephone Encounter (Signed)
-----  Message from Carilyn Goodpasture, RN sent at 01/24/2022  5:50 PM EST -----  ----- Message ----- From: Argentina Donovan, PA-C Sent: 01/24/2022  12:05 PM EST To: Carilyn Goodpasture, RN  Your blood count is normal. no anemia or abnormal lymphocytes.  This is reassuring.  I will let you know once the ultrasound results come in.  Thanks, Freeman Caldron, PA-c

## 2022-01-30 ENCOUNTER — Other Ambulatory Visit: Payer: Self-pay | Admitting: Physician Assistant

## 2022-01-30 ENCOUNTER — Ambulatory Visit
Admission: RE | Admit: 2022-01-30 | Discharge: 2022-01-30 | Disposition: A | Discharge status: Home / Self Care | Payer: Medicaid Other | Source: Ambulatory Visit | Attending: Physician Assistant | Admitting: Physician Assistant

## 2022-01-30 DIAGNOSIS — R590 Localized enlarged lymph nodes: Secondary | ICD-10-CM

## 2022-02-01 ENCOUNTER — Telehealth: Payer: Self-pay

## 2022-02-01 NOTE — Telephone Encounter (Signed)
-----  Message from Argentina Donovan, [place] sent at 01/30/2022  5:10 PM EST ----- The lymph node seems to be resolving and does not appear concerning.  The radiologist said to repeat the ultrasound in 3 months to ensure resolution.  If the area  becomes larger before then or you develop new symptoms, please schedule an appointment.  Otherwise, we will schedule an ultrasound for 3 months from now(Philena Obey-please schedule U/S for 3 months from now-order is placed).  Thanks, Freeman Caldron, PA-C

## 2022-02-01 NOTE — Telephone Encounter (Signed)
Pt was called and is aware Pt was called and is aware of results, DOB was confirmed.of results, DOB was confirmed.

## 2022-02-07 ENCOUNTER — Other Ambulatory Visit: Payer: Self-pay | Admitting: Physician Assistant

## 2022-02-07 DIAGNOSIS — D170 Benign lipomatous neoplasm of skin and subcutaneous tissue of head, face and neck: Secondary | ICD-10-CM

## 2022-02-07 NOTE — Telephone Encounter (Signed)
Noted called patient and she is aware stated that her pain is very "light" pain.   Also she stated that you all talked about a plastic surgeon for the  fatty tissue on her head

## 2022-02-11 NOTE — Telephone Encounter (Signed)
Called and patient is aware

## 2022-02-18 ENCOUNTER — Ambulatory Visit: Payer: Medicaid Other | Admitting: Adult Health

## 2022-03-07 ENCOUNTER — Institutional Professional Consult (permissible substitution): Payer: Medicaid Other | Admitting: Plastic Surgery

## 2022-04-04 ENCOUNTER — Institutional Professional Consult (permissible substitution): Payer: Medicaid Other | Admitting: Plastic Surgery

## 2022-04-29 ENCOUNTER — Ambulatory Visit (HOSPITAL_COMMUNITY): Payer: Medicaid Other

## 2022-05-15 ENCOUNTER — Institutional Professional Consult (permissible substitution): Payer: Medicaid Other | Admitting: Plastic Surgery

## 2022-05-27 ENCOUNTER — Ambulatory Visit (HOSPITAL_COMMUNITY): Admission: RE | Admit: 2022-05-27 | Payer: Medicaid Other | Source: Ambulatory Visit

## 2022-06-19 ENCOUNTER — Encounter: Payer: Self-pay | Admitting: Plastic Surgery

## 2022-06-19 ENCOUNTER — Ambulatory Visit: Payer: Medicaid Other | Admitting: Plastic Surgery

## 2022-06-19 VITALS — BP 119/78 | HR 72 | Ht 60.0 in | Wt 127.4 lb

## 2022-06-19 DIAGNOSIS — R220 Localized swelling, mass and lump, head: Secondary | ICD-10-CM | POA: Diagnosis not present

## 2022-06-19 DIAGNOSIS — D489 Neoplasm of uncertain behavior, unspecified: Secondary | ICD-10-CM

## 2022-06-19 NOTE — Progress Notes (Signed)
Referring Provider Hoy Register, MD [address] [place],  [place] [address]   CC:  Chief Complaint  Patient presents with   Advice Only      Jennifer Barber is an 37 y.o. female.  HPI: Jennifer Barber is referred today for evaluation of a mass on the left side of her forehead.  She underwent imaging in 2018 which showed a fat containing lesion consistent with a lipoma.  She states that this has been increasing in size and she would like to have it removed.  Allergies  Allergen Reactions   Bee Venom Swelling   Benadryl [Diphenhydramine] Swelling   Codeine Itching   Latex Rash    Outpatient Encounter Medications as of 06/19/2022  Medication Sig   cetirizine (ZYRTEC) 10 MG tablet Take 1 tablet (10 mg total) by mouth daily.   Multiple Vitamin (MULTIVITAMIN WITH MINERALS) TABS tablet Take 1 tablet by mouth daily.   Norethindrone-Ethinyl Estradiol-Fe Biphas (LO LOESTRIN FE) 1 MG-10 MCG / 10 MCG tablet Take 1 tablet by mouth daily. Take 1 daily by mouth   omeprazole (PRILOSEC) 20 MG capsule Take 1 capsule (20 mg total) by mouth daily.   traZODone (DESYREL) 50 MG tablet TAKE 0.5-1 TABLETS BY MOUTH AT BEDTIME AS NEEDED FOR SLEEP.   ferrous sulfate (FERROUSUL) 325 (65 FE) MG tablet Take 1 tablet (325 mg total) by mouth 3 (three) times daily with meals.   No facility-administered encounter medications on file as of 06/19/2022.     Past Medical History:  Diagnosis Date   Abnormal Pap smear    Anemia    Bipolar 1 disorder (HCC)    Blood dyscrasia    Sickle Cell Trait   Depression    Mental disorder    bipolar, schizophrenia   Schizophrenia (HCC)    Scoliosis    Sickle cell trait (HCC)     Past Surgical History:  Procedure Laterality Date   FEMORAL OSTEOTOMY W/ RODDING  2004   s/p accident , leg fx   LAPAROSCOPIC CHOLECYSTECTOMY     during second pregnancy    Family History  Problem Relation Age of Onset   Cancer Maternal Grandmother    Cancer Mother    Kidney  disease Mother    Alcohol abuse Mother    Mental illness Mother     Social History   Social History Narrative   Not on file     Review of Systems General: Denies fevers, chills, weight loss CV: Denies chest pain, shortness of breath, palpitations Forehead: A 3 x 3 cm mass which the patient says has been there for many years and is increasing in size she would like to have it removed  Physical Exam    06/19/2022    2:50 PM 01/23/2022   10:40 AM 07/05/2021   11:43 AM  Vitals with BMI  Height 5\' 0"     Weight 127 lbs 6 oz 132 lbs 6 oz 141 lbs 13 oz  BMI 24.88    Systolic 119 133 604  Diastolic 78 84 78  Pulse 72 83 86    General:  No acute distress,  Alert and oriented, Non-Toxic, Normal speech and affect Forehead: There is a soft nonfixed mass on the forehead on the left side.  This is consistent with a lipoma. Mammogram: Not applicable Assessment/Plan Soft tissue mass: Patient has a soft tissue mass on the forehead.  I believe that the location and size of the mass make this more amenable  to resection in the operating room.  I discussed this with the patient.  She understands there is a slight risk of recurrence and that she will have sutures that will need to be removed 5 to 7 days after the surgical procedure.  Photographs were obtained today with her consent.  All questions were answered to her satisfaction.  Will schedule for surgery at her request.  Santiago Glad 06/19/2022, 4:34 PM

## 2022-07-30 ENCOUNTER — Ambulatory Visit: Payer: Medicaid Other | Admitting: Family Medicine

## 2022-08-12 ENCOUNTER — Telehealth: Payer: Self-pay | Admitting: Family Medicine

## 2022-08-12 NOTE — Telephone Encounter (Signed)
Attempted to contact patient and message states welcome to voicemail service and is asking for a [place] code.

## 2022-08-12 NOTE — Telephone Encounter (Signed)
Copied from CRM [id number]. Topic: General - Other >> Aug 12, 2022 11:21 AM Lennox Pippins wrote: Patient called and wanted to discuss the depo shot, requesting a call back about this.

## 2022-08-14 ENCOUNTER — Ambulatory Visit: Payer: Medicaid Other | Admitting: Physician Assistant

## 2022-08-16 ENCOUNTER — Telehealth: Payer: Self-pay | Admitting: Plastic Surgery

## 2022-08-16 NOTE — Telephone Encounter (Signed)
Cam, Moschetto [id number] 06-19-22 consult w Ladona Ridgel lipoma on forhead, asking the status of sx

## 2022-10-09 ENCOUNTER — Encounter: Payer: MEDICAID | Admitting: Physician Assistant

## 2022-10-23 ENCOUNTER — Encounter: Payer: MEDICAID | Admitting: Physician Assistant

## 2022-10-24 ENCOUNTER — Encounter: Payer: Self-pay | Admitting: Family Medicine

## 2022-10-24 ENCOUNTER — Ambulatory Visit: Payer: MEDICAID | Attending: Family Medicine | Admitting: Family Medicine

## 2022-10-24 VITALS — BP 130/81 | HR 81 | Ht 60.0 in | Wt 132.0 lb

## 2022-10-24 DIAGNOSIS — Z13228 Encounter for screening for other metabolic disorders: Secondary | ICD-10-CM

## 2022-10-24 DIAGNOSIS — K219 Gastro-esophageal reflux disease without esophagitis: Secondary | ICD-10-CM

## 2022-10-24 DIAGNOSIS — Z1159 Encounter for screening for other viral diseases: Secondary | ICD-10-CM

## 2022-10-24 DIAGNOSIS — R590 Localized enlarged lymph nodes: Secondary | ICD-10-CM

## 2022-10-24 DIAGNOSIS — Z131 Encounter for screening for diabetes mellitus: Secondary | ICD-10-CM

## 2022-10-24 MED ORDER — OMEPRAZOLE 20 MG PO CPDR: 20.0000 mg | DELAYED_RELEASE_CAPSULE | Freq: Every day | ORAL | 3 refills | Status: AC

## 2022-10-24 NOTE — Patient Instructions (Addendum)
VISIT SUMMARY:  During your recent visit, we discussed several health concerns including a pelvic lymph node, your upcoming plastic surgery, acid reflux, bipolar disorder, and contraception. We also discussed your general health maintenance.  YOUR PLAN:  -PELVIC LYMPH NODE: You had a lymph node on the right side of your pelvis that we previously identified. We need to keep an eye on it, so I've ordered a follow-up ultrasound at [facility].  -UPCOMING PLASTIC SURGERY: You have a lipoma removal surgery scheduled for the day before Thanksgiving. There are no changes to your current medications or care plan in relation to this surgery.  -ACID REFLUX: You've been using Omeprazole occasionally for acid reflux symptoms. I've refilled your prescription for as-needed use.  -BIPOLAR DISORDER: You've stopped taking Trazodone due to side effects and are managing your bipolar disorder symptoms with alternative methods. I've removed Trazodone from your medication list.  -CONTRACEPTION: You've stopped using low estrogen birth control due to side effects and concerns about a family history of breast cancer. We discussed options for non-hormonal IUD or a progesterone-only oral contraceptive.  -GENERAL HEALTH MAINTENANCE: I've ordered labs for cholesterol, kidney function, liver function, diabetes screening, and Hepatitis C. I recommend an annual visit, with additional appointments as needed.  INSTRUCTIONS:  Please schedule your follow-up ultrasound at [facility]. Continue to take your medications as prescribed, and remember to take your Omeprazole as needed for acid reflux. We will continue to monitor your health and adjust your care plan as necessary. If you have any questions or concerns, don't hesitate to reach out.
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
## 2022-10-25 LAB — HCV AB W REFLEX TO QUANT PCR: HCV Ab: NONREACTIVE

## 2022-10-25 LAB — CBC WITH DIFFERENTIAL/PLATELET
Basophils Absolute: 0.1 10*3/uL (ref 0.0–0.2)
Basos: 1 %
EOS (ABSOLUTE): 0.1 10*3/uL (ref 0.0–0.4)
Eos: 1 %
Hematocrit: 43.1 % (ref 34.0–46.6)
Hemoglobin: 13.4 g/dL (ref 11.1–15.9)
Immature Grans (Abs): 0 10*3/uL (ref 0.0–0.1)
Immature Granulocytes: 0 %
Lymphocytes Absolute: 3.1 10*3/uL (ref 0.7–3.1)
Lymphs: 46 %
MCH: 26.8 pg (ref 26.6–33.0)
MCHC: 31.1 g/dL — ABNORMAL LOW (ref 31.5–35.7)
MCV: 86 fL (ref 79–97)
Monocytes Absolute: 0.3 10*3/uL (ref 0.1–0.9)
Monocytes: 5 %
Neutrophils Absolute: 3.2 10*3/uL (ref 1.4–7.0)
Neutrophils: 47 %
Platelets: 255 10*3/uL (ref 150–450)
RBC: 5 x10E6/uL (ref 3.77–5.28)
RDW: 12.9 % (ref 11.7–15.4)
WBC: 6.7 10*3/uL (ref 3.4–10.8)

## 2022-10-25 LAB — CMP14+EGFR
ALT: 11 [IU]/L (ref 0–32)
AST: 15 [IU]/L (ref 0–40)
Albumin: 4.4 g/dL (ref 3.9–4.9)
Alkaline Phosphatase: 38 [IU]/L — ABNORMAL LOW (ref 44–121)
BUN/Creatinine Ratio: 11 (ref 9–23)
BUN: 9 mg/dL (ref 6–20)
Bilirubin Total: 0.4 mg/dL (ref 0.0–1.2)
CO2: 23 mmol/L (ref 20–29)
Calcium: 9.8 mg/dL (ref 8.7–10.2)
Chloride: 104 mmol/L (ref 96–106)
Creatinine, Ser: 0.85 mg/dL (ref 0.57–1.00)
Globulin, Total: 2.7 g/dL (ref 1.5–4.5)
Glucose: 86 mg/dL (ref 70–99)
Potassium: 4.7 mmol/L (ref 3.5–5.2)
Sodium: 141 mmol/L (ref 134–144)
Total Protein: 7.1 g/dL (ref 6.0–8.5)
eGFR: 90 mL/min/{1.73_m2} (ref >59)

## 2022-10-25 LAB — LP+NON-HDL CHOLESTEROL
Cholesterol, Total: 164 mg/dL (ref 100–199)
HDL: 57 mg/dL (ref >39)
LDL Chol Calc (NIH): 97 mg/dL (ref 0–99)
Total Non-HDL-Chol (LDL+VLDL): 107 mg/dL (ref 0–129)
Triglycerides: 45 mg/dL (ref 0–149)
VLDL Cholesterol Cal: 10 mg/dL (ref 5–40)

## 2022-10-25 LAB — HCV INTERPRETATION

## 2022-10-25 LAB — HEMOGLOBIN A1C
Est. average glucose Bld gHb Est-mCnc: 105 mg/dL
Hgb A1c MFr Bld: 5.3 % (ref 4.8–5.6)

## 2022-10-29 ENCOUNTER — Other Ambulatory Visit: Payer: MEDICAID

## 2022-11-06 ENCOUNTER — Encounter: Payer: MEDICAID | Admitting: Physician Assistant
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
## 2022-11-12 ENCOUNTER — Ambulatory Visit (INDEPENDENT_AMBULATORY_CARE_PROVIDER_SITE_OTHER): Payer: MEDICAID | Admitting: Physician Assistant

## 2022-11-12 ENCOUNTER — Encounter: Payer: Self-pay | Admitting: Physician Assistant

## 2022-11-12 VITALS — BP 103/68 | HR 74 | Ht 60.0 in | Wt 124.6 lb

## 2022-11-12 DIAGNOSIS — D489 Neoplasm of uncertain behavior, unspecified: Secondary | ICD-10-CM

## 2022-11-17 IMAGING — DX DG CHEST 2V
2 series · 2 of 2 positions shown · non-contrast
Comparison: None.

CLINICAL DATA: Chest pain

EXAM:
CHEST - 2 VIEW

[chest pa]
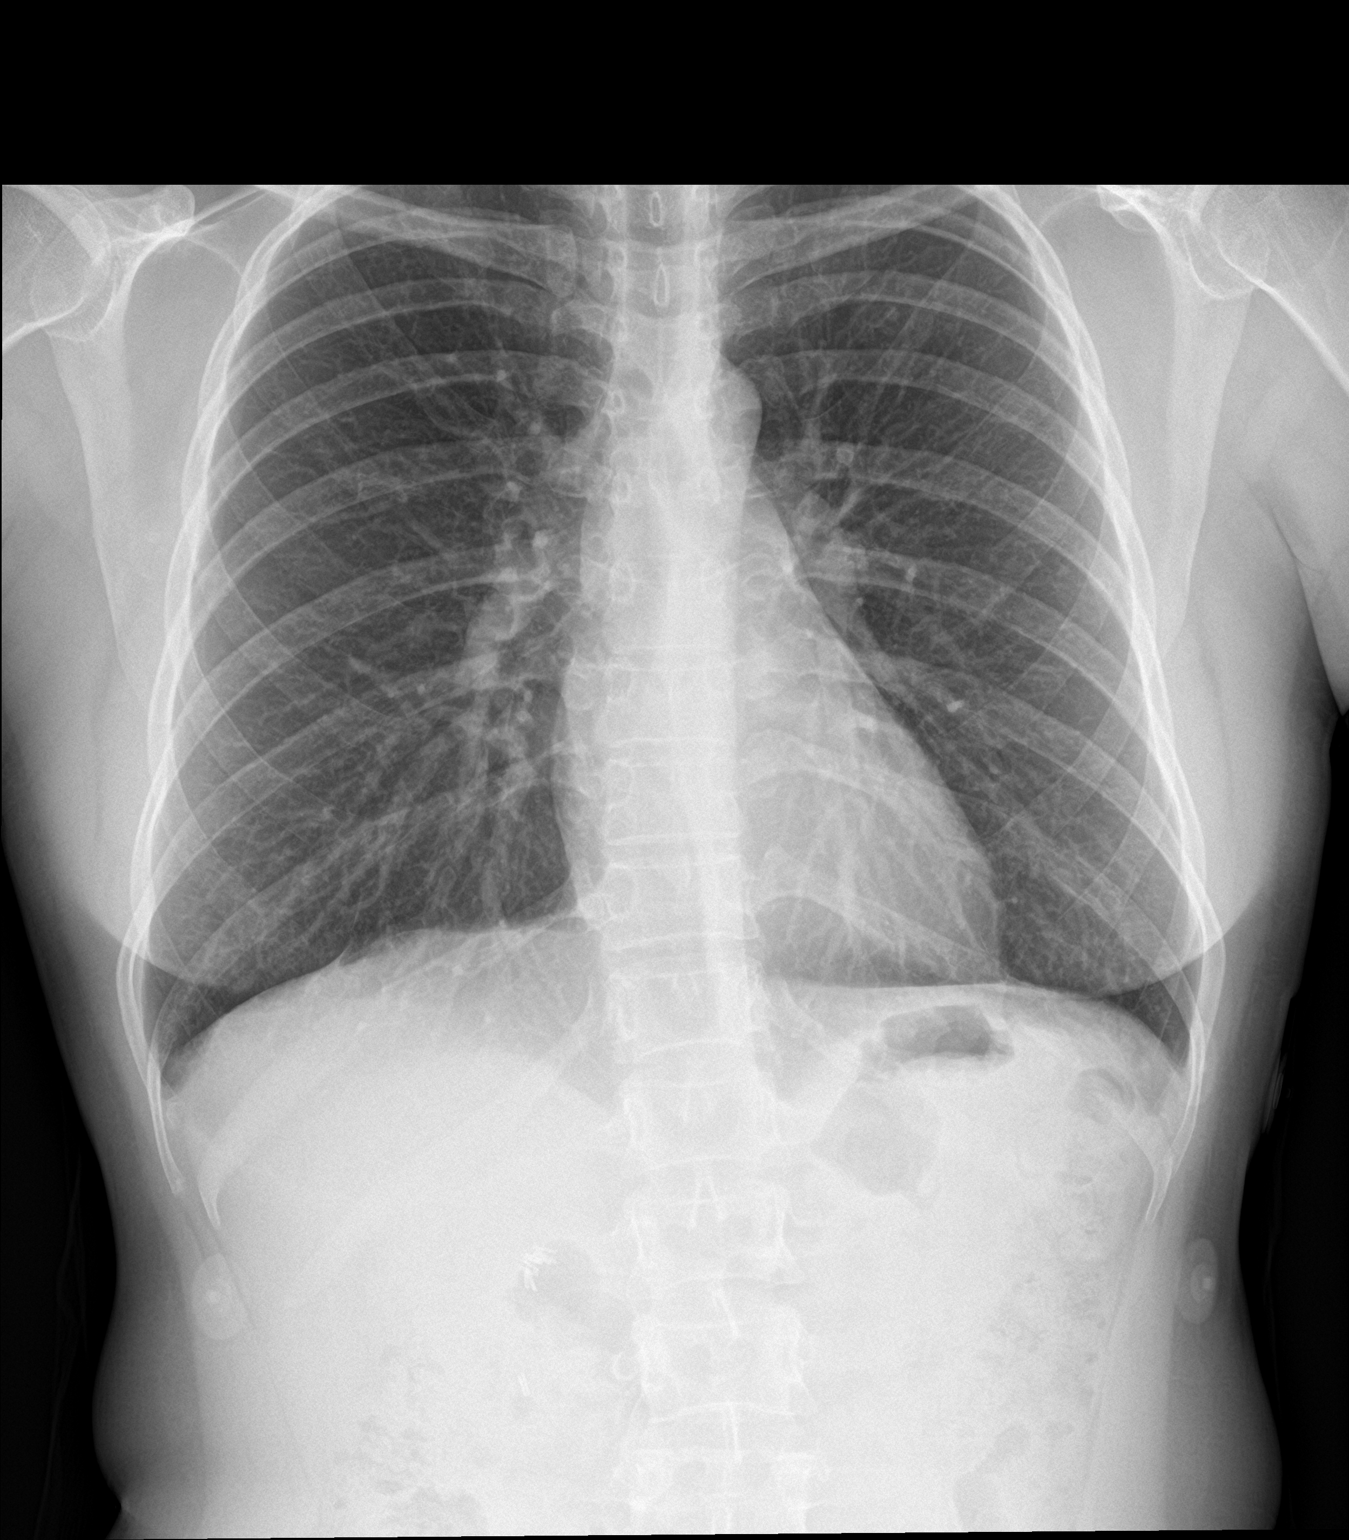

[chest lat]
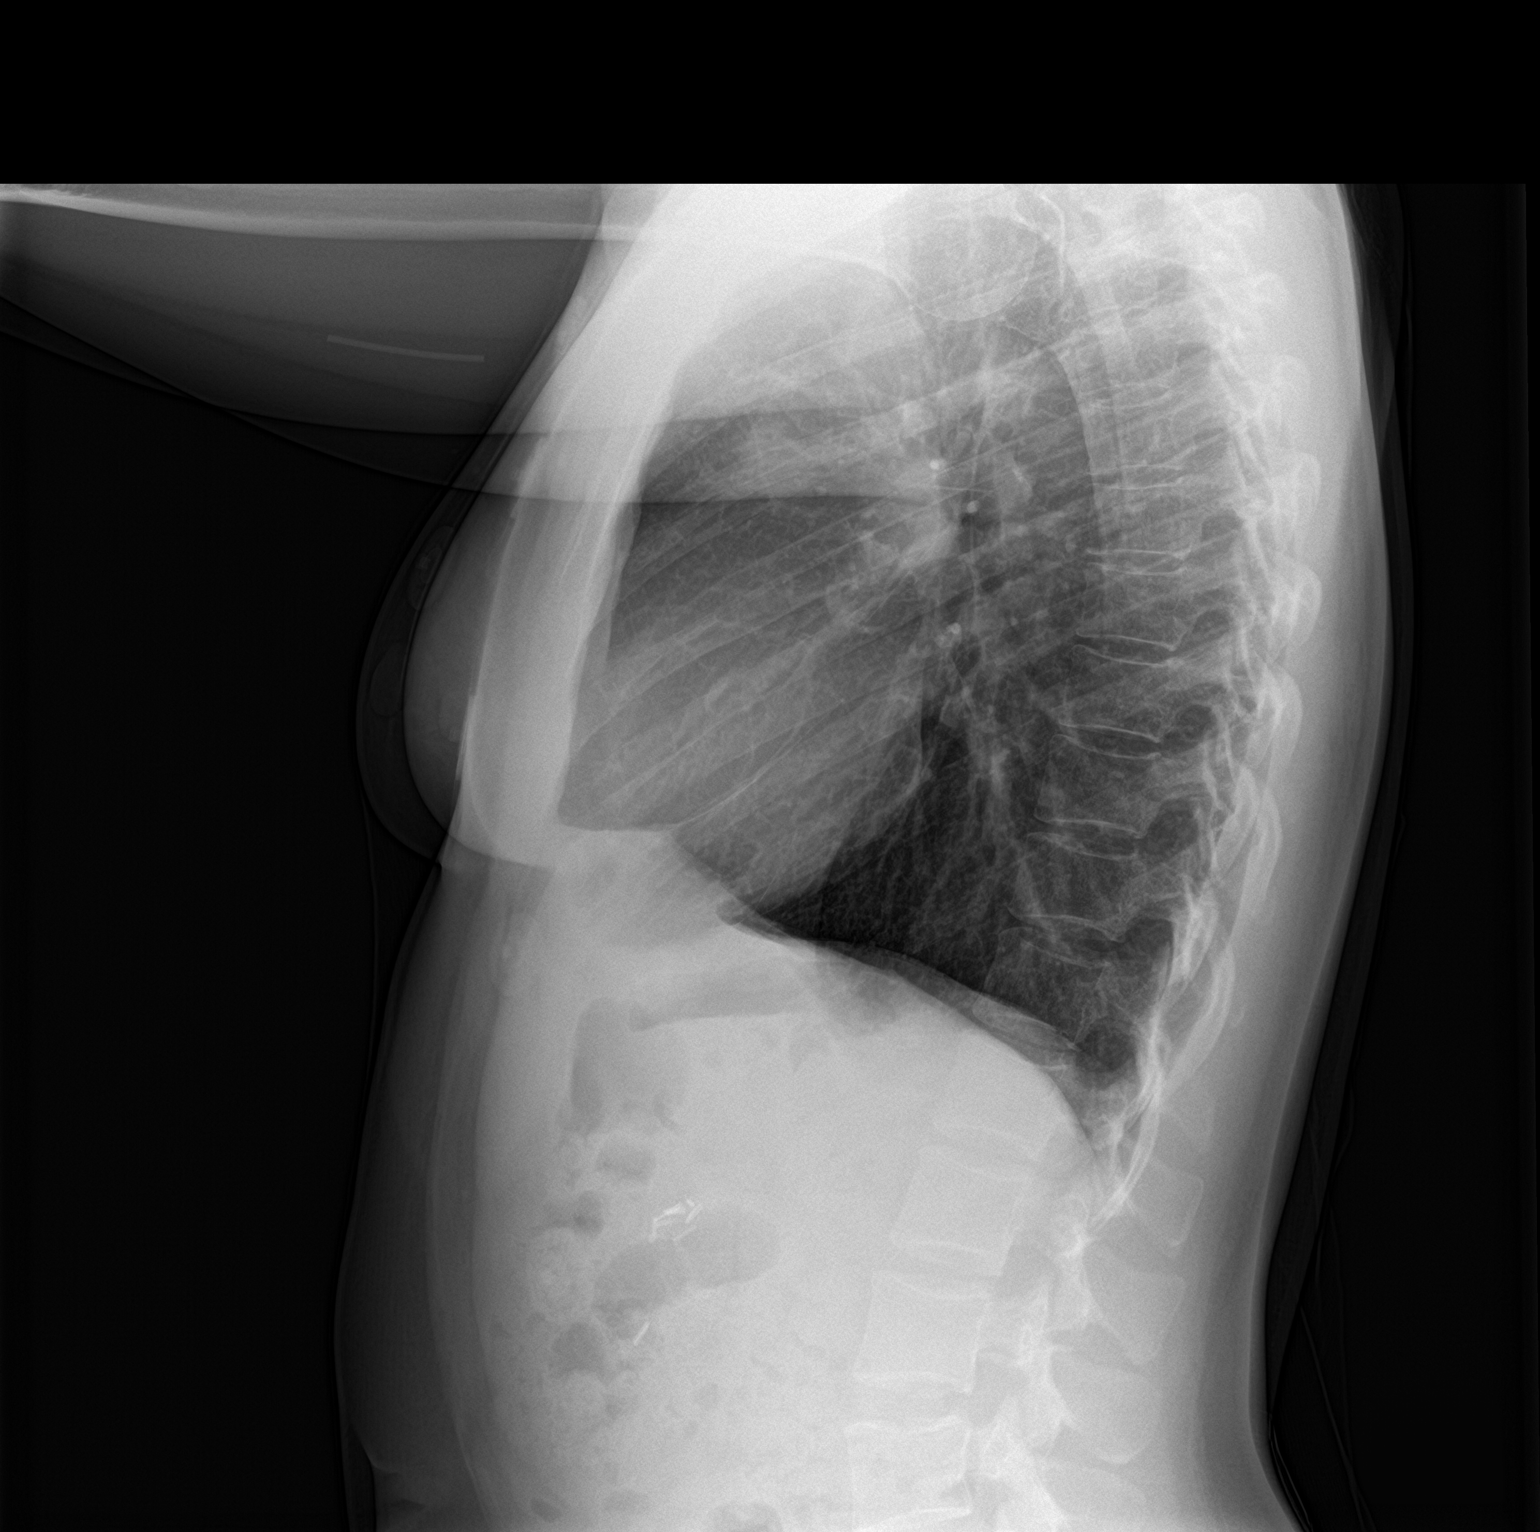

[2 of 2 positions shown; findings below may reference images not displayed]

FINDINGS: The heart size and mediastinal contours are within normal limits.
Both lungs are clear. The visualized skeletal structures are
unremarkable.
IMPRESSION: No active cardiopulmonary disease.

## 2022-11-27 ENCOUNTER — Encounter (HOSPITAL_BASED_OUTPATIENT_CLINIC_OR_DEPARTMENT_OTHER): Payer: Self-pay | Admitting: *Deleted

## 2022-12-04 ENCOUNTER — Ambulatory Visit (HOSPITAL_BASED_OUTPATIENT_CLINIC_OR_DEPARTMENT_OTHER)
Admission: RE | Admit: 2022-12-04 | Discharge: 2022-12-04 | Disposition: A | Discharge status: Home / Self Care | Payer: MEDICAID | Attending: Plastic Surgery | Admitting: Plastic Surgery

## 2022-12-04 ENCOUNTER — Other Ambulatory Visit: Payer: Self-pay

## 2022-12-04 ENCOUNTER — Ambulatory Visit (HOSPITAL_BASED_OUTPATIENT_CLINIC_OR_DEPARTMENT_OTHER): Payer: MEDICAID | Admitting: Anesthesiology

## 2022-12-04 ENCOUNTER — Encounter (HOSPITAL_BASED_OUTPATIENT_CLINIC_OR_DEPARTMENT_OTHER)
Admission: RE | Disposition: A | Discharge status: Home / Self Care | Payer: Self-pay | Source: Home / Self Care | Attending: Plastic Surgery

## 2022-12-04 ENCOUNTER — Encounter (HOSPITAL_BASED_OUTPATIENT_CLINIC_OR_DEPARTMENT_OTHER): Payer: Self-pay | Admitting: Plastic Surgery

## 2022-12-04 DIAGNOSIS — Z5986 Financial insecurity: Secondary | ICD-10-CM | POA: Diagnosis not present

## 2022-12-04 DIAGNOSIS — R220 Localized swelling, mass and lump, head: Secondary | ICD-10-CM

## 2022-12-04 DIAGNOSIS — F17210 Nicotine dependence, cigarettes, uncomplicated: Secondary | ICD-10-CM | POA: Insufficient documentation

## 2022-12-04 DIAGNOSIS — Z01818 Encounter for other preprocedural examination: Secondary | ICD-10-CM

## 2022-12-04 DIAGNOSIS — D1779 Benign lipomatous neoplasm of other sites: Secondary | ICD-10-CM | POA: Diagnosis not present

## 2022-12-04 DIAGNOSIS — D170 Benign lipomatous neoplasm of skin and subcutaneous tissue of head, face and neck: Secondary | ICD-10-CM

## 2022-12-04 DIAGNOSIS — D573 Sickle-cell trait: Secondary | ICD-10-CM | POA: Insufficient documentation

## 2022-12-04 HISTORY — PX: MASS EXCISION: SHX2000

## 2022-12-04 HISTORY — DX: Gastro-esophageal reflux disease without esophagitis: K21.9

## 2022-12-04 LAB — POCT PREGNANCY, URINE: Preg Test, Ur: NEGATIVE

## 2022-12-04 SURGERY — EXCISION MASS
Anesthesia: General | Site: Head | Laterality: Left

## 2022-12-04 MED ORDER — LIDOCAINE-EPINEPHRINE 1 %-1:100000 IJ SOLN
INTRAMUSCULAR | Status: AC
  Filled 2022-12-04: qty 1

## 2022-12-04 MED ORDER — PROPOFOL 10 MG/ML IV BOLUS
INTRAVENOUS | Status: AC
  Filled 2022-12-04: qty 20

## 2022-12-04 MED ORDER — SODIUM CHLORIDE 0.9 % IV SOLN
INTRAVENOUS | Status: DC | PRN
Start: 2022-12-04 — End: 2022-12-04

## 2022-12-04 MED ORDER — SODIUM CHLORIDE 0.9 % IV SOLN
12.5000 mg | INTRAVENOUS | Status: DC | PRN
  Filled 2022-12-04: qty 0.5

## 2022-12-04 MED ORDER — LIDOCAINE 2% (20 MG/ML) 5 ML SYRINGE
INTRAMUSCULAR | Status: DC | PRN
  Administered 2022-12-04: 60 mg via INTRAVENOUS

## 2022-12-04 MED ORDER — OXYCODONE HCL 5 MG PO TABS: 5.0000 mg | ORAL_TABLET | Freq: Once | ORAL | Status: DC | PRN

## 2022-12-04 MED ORDER — MEPERIDINE HCL 25 MG/ML IJ SOLN: 6.2500 mg | INTRAMUSCULAR | Status: DC | PRN

## 2022-12-04 MED ORDER — CEFAZOLIN SODIUM-DEXTROSE 2-4 GM/100ML-% IV SOLN
2.0000 g | INTRAVENOUS | Status: AC
  Administered 2022-12-04: 2 g via INTRAVENOUS

## 2022-12-04 MED ORDER — AMISULPRIDE (ANTIEMETIC) 5 MG/2ML IV SOLN: 10.0000 mg | Freq: Once | INTRAVENOUS | Status: DC | PRN

## 2022-12-04 MED ORDER — BACITRACIN 500 UNIT/GM EX OINT
TOPICAL_OINTMENT | CUTANEOUS | Status: DC | PRN
  Administered 2022-12-04: 1 via TOPICAL

## 2022-12-04 MED ORDER — MIDAZOLAM HCL 5 MG/5ML IJ SOLN
INTRAMUSCULAR | Status: DC | PRN
  Administered 2022-12-04: 2 mg via INTRAVENOUS

## 2022-12-04 MED ORDER — FENTANYL CITRATE (PF) 100 MCG/2ML IJ SOLN
INTRAMUSCULAR | Status: DC | PRN
  Administered 2022-12-04 (×2): 50 ug via INTRAVENOUS

## 2022-12-04 MED ORDER — FENTANYL CITRATE (PF) 100 MCG/2ML IJ SOLN
INTRAMUSCULAR | Status: AC
  Filled 2022-12-04: qty 2

## 2022-12-04 MED ORDER — OXYCODONE HCL 5 MG/5ML PO SOLN: 5.0000 mg | Freq: Once | ORAL | Status: DC | PRN

## 2022-12-04 MED ORDER — CHLORHEXIDINE GLUCONATE CLOTH 2 % EX PADS: 6.0000 | MEDICATED_PAD | Freq: Once | CUTANEOUS | Status: DC

## 2022-12-04 MED ORDER — ONDANSETRON HCL 4 MG PO TABS: 4.0000 mg | ORAL_TABLET | Freq: Three times a day (TID) | ORAL | 0 refills | Status: DC | PRN

## 2022-12-04 MED ORDER — BACITRACIN ZINC 500 UNIT/GM EX OINT
TOPICAL_OINTMENT | CUTANEOUS | Status: AC
  Filled 2022-12-04: qty 0.9

## 2022-12-04 MED ORDER — HYDROMORPHONE HCL 1 MG/ML IJ SOLN: 0.2500 mg | INTRAMUSCULAR | Status: DC | PRN

## 2022-12-04 MED ORDER — ONDANSETRON HCL 4 MG/2ML IJ SOLN
INTRAMUSCULAR | Status: DC | PRN
  Administered 2022-12-04: 4 mg via INTRAVENOUS

## 2022-12-04 MED ORDER — CEFAZOLIN SODIUM-DEXTROSE 2-4 GM/100ML-% IV SOLN
INTRAVENOUS | Status: AC
  Filled 2022-12-04: qty 100

## 2022-12-04 MED ORDER — PROPOFOL 10 MG/ML IV BOLUS
INTRAVENOUS | Status: DC | PRN
  Administered 2022-12-04: 150 mg via INTRAVENOUS

## 2022-12-04 MED ORDER — BUPIVACAINE-EPINEPHRINE (PF) 0.25% -1:200000 IJ SOLN
INTRAMUSCULAR | Status: DC | PRN
  Administered 2022-12-04: 2 mL

## 2022-12-04 MED ORDER — MIDAZOLAM HCL 2 MG/2ML IJ SOLN
INTRAMUSCULAR | Status: AC
Start: 2022-12-04 — End: ?
  Filled 2022-12-04: qty 2

## 2022-12-04 MED ORDER — DEXAMETHASONE SODIUM PHOSPHATE 4 MG/ML IJ SOLN
INTRAMUSCULAR | Status: DC | PRN
  Administered 2022-12-04: 10 mg via INTRAVENOUS

## 2022-12-04 MED ORDER — OXYCODONE HCL 5 MG PO TABS: 5.0000 mg | ORAL_TABLET | Freq: Three times a day (TID) | ORAL | 0 refills | Status: DC | PRN

## 2022-12-04 MED ORDER — BUPIVACAINE-EPINEPHRINE (PF) 0.25% -1:200000 IJ SOLN
INTRAMUSCULAR | Status: AC
  Filled 2022-12-04: qty 30

## 2022-12-04 SURGICAL SUPPLY — 75 items
BAND RUBBER #18 3X1/16 STRL (MISCELLANEOUS) IMPLANT
BENZOIN TINCTURE PRP APPL 2/3 (GAUZE/BANDAGES/DRESSINGS) IMPLANT
BLADE CLIPPER SURG (BLADE) IMPLANT
BLADE SURG 15 STRL LF DISP TIS (BLADE) ×1 IMPLANT
CANISTER SUCT 1200ML W/VALVE (MISCELLANEOUS) IMPLANT
CHLORAPREP W/TINT 26 (MISCELLANEOUS) ×1 IMPLANT
COVER BACK TABLE 60X90IN (DRAPES) ×1 IMPLANT
COVER MAYO STAND STRL (DRAPES) ×1 IMPLANT
DERMABOND ADVANCED .7 DNX12 (GAUZE/BANDAGES/DRESSINGS) IMPLANT
DRAIN WOUND RND W/TROCAR (DRAIN) IMPLANT
DRAPE LAPAROTOMY 100X72 PEDS (DRAPES) IMPLANT
DRAPE U-SHAPE 76X120 STRL (DRAPES) IMPLANT
DRAPE UTILITY XL STRL (DRAPES) ×1 IMPLANT
DRSG TELFA 3X8 NADH STRL (GAUZE/BANDAGES/DRESSINGS) IMPLANT
ELECT COATED BLADE 2.86 ST (ELECTRODE) IMPLANT
ELECT NDL BLADE 2-5/6 (NEEDLE) ×1 IMPLANT
ELECT NEEDLE BLADE 2-5/6 (NEEDLE) ×1
ELECT REM PT RETURN 9FT ADLT (ELECTROSURGICAL) ×1
ELECT REM PT RETURN 9FT PED (ELECTROSURGICAL)
ELECTRODE REM PT RETRN 9FT PED (ELECTROSURGICAL) IMPLANT
ELECTRODE REM PT RTRN 9FT ADLT (ELECTROSURGICAL) IMPLANT
EVACUATOR SILICONE 100CC (DRAIN) IMPLANT
GAUZE SPONGE 2X2 STRL 8-PLY (GAUZE/BANDAGES/DRESSINGS) IMPLANT
GAUZE SPONGE 4X4 12PLY STRL LF (GAUZE/BANDAGES/DRESSINGS) IMPLANT
GAUZE XEROFORM 1X8 LF (GAUZE/BANDAGES/DRESSINGS) IMPLANT
GLOVE BIO SURGEON STRL SZ 6.5 (GLOVE) IMPLANT
GLOVE BIO SURGEON STRL SZ7.5 (GLOVE) IMPLANT
GLOVE BIO SURGEON STRL SZ8 (GLOVE) ×1 IMPLANT
GLOVE BIOGEL PI IND STRL 7.0 (GLOVE) IMPLANT
GLOVE BIOGEL PI IND STRL 8 (GLOVE) IMPLANT
GLOVE SURG SS PI 7.0 STRL IVOR (GLOVE) IMPLANT
GLOVE SURG SS PI 7.5 STRL IVOR (GLOVE) IMPLANT
GLOVE SURG SS PI 8.0 STRL IVOR (GLOVE) IMPLANT
GOWN STRL REUS W/ TWL LRG LVL3 (GOWN DISPOSABLE) ×1 IMPLANT
GOWN STRL REUS W/ TWL XL LVL3 (GOWN DISPOSABLE) IMPLANT
GOWN STRL REUS W/TWL XL LVL3 (GOWN DISPOSABLE) ×1 IMPLANT
HIBICLENS CHG 4% 4OZ BTL (MISCELLANEOUS) ×1 IMPLANT
MARKER SKIN DUAL TIP RULER LAB (MISCELLANEOUS) IMPLANT
NDL FILTER BLUNT 18X1 1/2 (NEEDLE) IMPLANT
NDL HYPO 30GX1 BEV (NEEDLE) IMPLANT
NDL PRECISIONGLIDE 27X1.5 (NEEDLE) ×1 IMPLANT
NDL SAFETY ECLIPSE 18X1.5 (NEEDLE) IMPLANT
NEEDLE FILTER BLUNT 18X1 1/2 (NEEDLE)
NEEDLE HYPO 30GX1 BEV (NEEDLE)
NEEDLE PRECISIONGLIDE 27X1.5 (NEEDLE) ×1
NS IRRIG 1000ML POUR BTL (IV SOLUTION) IMPLANT
PACK BASIN DAY SURGERY FS (CUSTOM PROCEDURE TRAY) ×1 IMPLANT
PACK UNIVERSAL I (CUSTOM PROCEDURE TRAY) IMPLANT
PENCIL SMOKE EVACUATOR (MISCELLANEOUS) ×1 IMPLANT
SHEET MEDIUM DRAPE 40X70 STRL (DRAPES) IMPLANT
SLEEVE SCD COMPRESS KNEE MED (STOCKING) IMPLANT
SPONGE T-LAP 18X18 ~~LOC~~+RFID (SPONGE) IMPLANT
STAPLER SKIN PROX WIDE 3.9 (STAPLE) ×1 IMPLANT
STRIP CLOSURE SKIN 1/2X4 (GAUZE/BANDAGES/DRESSINGS) IMPLANT
SUCTION TUBE FRAZIER 10FR DISP (SUCTIONS) IMPLANT
SUT ETHILON 4 0 PS 2 18 (SUTURE) IMPLANT
SUT MNCRL AB 3-0 PS2 27 (SUTURE) IMPLANT
SUT MNCRL AB 4-0 PS2 18 (SUTURE) IMPLANT
SUT MON AB 5-0 P3 18 (SUTURE) IMPLANT
SUT PDS 3-0 CT2 (SUTURE)
SUT PDS II 3-0 CT2 27 ABS (SUTURE) IMPLANT
SUT PROLENE 5 0 P 3 (SUTURE) IMPLANT
SUT VIC AB 3-0 SH 27X BRD (SUTURE) IMPLANT
SUT VIC AB 4-0 PS2 18 (SUTURE) IMPLANT
SUT VICRYL RAPIDE 4-0 (SUTURE) IMPLANT
SUT VLOC 90 P-14 23 (SUTURE) IMPLANT
SWAB COLLECTION DEVICE MRSA (MISCELLANEOUS) IMPLANT
SWAB CULTURE ESWAB REG 1ML (MISCELLANEOUS) IMPLANT
SYR 50ML LL SCALE MARK (SYRINGE) IMPLANT
SYR BULB EAR ULCER 3OZ GRN STR (SYRINGE) IMPLANT
SYR CONTROL 10ML LL (SYRINGE) ×1 IMPLANT
TOWEL GREEN STERILE FF (TOWEL DISPOSABLE) ×1 IMPLANT
TRAY DSU PREP LF (CUSTOM PROCEDURE TRAY) IMPLANT
TUBE CONNECTING 20X1/4 (TUBING) IMPLANT
YANKAUER SUCT BULB TIP NO VENT (SUCTIONS) IMPLANT

## 2022-12-04 NOTE — Interval H&P Note (Signed)
History and Physical Interval Note: No change in exam or indication for surgery Site marked with her concurrence All questions answered. Will proceed with lipoma removal at her request  12/04/2022 7:00 AM  Jennifer Barber  has presented today for surgery, with the diagnosis of mass on forehead.  The various methods of treatment have been discussed with the patient and family. After consideration of risks, benefits and other options for treatment, the patient has consented to  Procedure(s): Excision of soft tissue mass, left forehead (Left) as a surgical intervention.  The patient's history has been reviewed, patient examined, no change in status, stable for surgery.  I have reviewed the patient's chart and labs.  Questions were answered to the patient's satisfaction.     Santiago Glad

## 2022-12-04 NOTE — Anesthesia Preprocedure Evaluation (Addendum)
Anesthesia Evaluation  Patient identified by MRN, date of birth, ID band Patient awake    Reviewed: Allergy & Precautions, H&P , NPO status , Patient's Chart, lab work & pertinent test results, reviewed documented beta blocker date and time   History of Anesthesia Complications Negative for: history of anesthetic complications  Airway Mallampati: II  TM Distance: >3 FB Neck ROM: full    Dental  (+) Teeth Intact   Pulmonary former smoker   breath sounds clear to auscultation       Cardiovascular negative cardio ROS  Rhythm:regular Rate:Normal     Neuro/Psych  Headaches   Depression Bipolar Disorder Schizophrenia  Scoliosis - never treated    GI/Hepatic negative GI ROS, Neg liver ROS,,,  Endo/Other  negative endocrine ROS    Renal/GU negative Renal ROS  negative genitourinary   Musculoskeletal   Abdominal   Peds  Hematology  (+) Blood dyscrasia, Sickle cell trait   Anesthesia Other Findings   Reproductive/Obstetrics                              Anesthesia Physical Anesthesia Plan  ASA: II  Anesthesia Plan: General   Post-op Pain Management: Minimal or no pain anticipated   Induction: Intravenous  PONV Risk Score and Plan: 3 and Ondansetron, Dexamethasone, Midazolam and Treatment may vary due to age or medical condition  Airway Management Planned: LMA  Additional Equipment:   Intra-op Plan:   Post-operative Plan: Extubation in OR  Informed Consent: I have reviewed the patients History and Physical, chart, labs and discussed the procedure including the risks, benefits and alternatives for the proposed anesthesia with the patient or authorized representative who has indicated his/her understanding and acceptance.       Plan Discussed with:   Anesthesia Plan Comments:          Anesthesia Quick Evaluation

## 2022-12-04 NOTE — Discharge Instructions (Addendum)
  Post Anesthesia Home Care Instructions  Activity: Get plenty of rest for the remainder of the day. A responsible individual must stay with you for 24 hours following the procedure.  For the next 24 hours, DO NOT: -Drive a car -Advertising copywriter -Drink alcoholic beverages -Take any medication unless instructed by your physician -Make any legal decisions or sign important papers. -Please leave the dressing in place for 24 hours. - You may shower and wash your hair on Friday. - You may keep a dressing on sutures if you like. - Please use a headband to hold the dressing in place.  Meals: Start with liquid foods such as gelatin or soup. Progress to regular foods as tolerated. Avoid greasy, spicy, heavy foods. If nausea and/or vomiting occur, drink only clear liquids until the nausea and/or vomiting subsides. Call your physician if vomiting continues.  Special Instructions/Symptoms: Your throat may feel dry or sore from the anesthesia or the breathing tube placed in your throat during surgery. If this causes discomfort, gargle with warm salt water. The discomfort should disappear within 24 hours.  If you had a scopolamine patch placed behind your ear for the management of post- operative nausea and/or vomiting:  1. The medication in the patch is effective for 72 hours, after which it should be removed.  Wrap patch in a tissue and discard in the trash. Wash hands thoroughly with soap and water. 2. You may remove the patch earlier than 72 hours if you experience unpleasant side effects which may include dry mouth, dizziness or visual disturbances. 3. Avoid touching the patch. Wash your hands with soap and water after contact with the patch.

## 2022-12-04 NOTE — Transfer of Care (Signed)
Immediate Anesthesia Transfer of Care Note  Patient: Jennifer Barber  Procedure(s) Performed: Excision of soft tissue mass, left forehead (Left: Head)  Patient Location: PACU  Anesthesia Type:General  Level of Consciousness: awake, alert , and oriented  Airway & Oxygen Therapy: Patient Spontanous Breathing and Patient connected to face mask oxygen  Post-op Assessment: Report given to RN and Post -op Vital signs reviewed and stable  Post vital signs: Reviewed and stable  Last Vitals:  Vitals Value Taken Time  BP 113/74 12/04/22 0842  Temp 36.9 C 12/04/22 0842  Pulse 66 12/04/22 0842  Resp 16 12/04/22 0842  SpO2 99 % 12/04/22 0842    Last Pain:  Vitals:   12/04/22 0842  TempSrc:   PainSc: 0-No pain         Complications: No notable events documented.

## 2022-12-04 NOTE — Anesthesia Postprocedure Evaluation (Signed)
Anesthesia Post Note  Patient: Jennifer Barber  Procedure(s) Performed: Excision of soft tissue mass, left forehead (Left: Head)     Patient location during evaluation: PACU Anesthesia Type: General Level of consciousness: awake and alert Pain management: pain level controlled Vital Signs Assessment: post-procedure vital signs reviewed and stable Respiratory status: spontaneous breathing, nonlabored ventilation and respiratory function stable Cardiovascular status: blood pressure returned to baseline and stable Postop Assessment: no apparent nausea or vomiting Anesthetic complications: no   No notable events documented.  Last Vitals:  Vitals:   12/04/22 0830 12/04/22 0842  BP: 104/69 113/74  Pulse: 78 66  Resp: 19 16  Temp:  36.9 C  SpO2: 96% 99%    Last Pain:  Vitals:   12/04/22 0842  TempSrc:   PainSc: 0-No pain                 Lowella Curb

## 2022-12-04 NOTE — Op Note (Signed)
DATE OF OPERATION: 12/04/2022  LOCATION: [facility] operating Room  PREOPERATIVE DIAGNOSIS: Left forehead soft tissue mass  POSTOPERATIVE DIAGNOSIS: Same  PROCEDURE: Excision of left forehead soft tissue mass  SURGEON: Loren Racer, MD  ASSISTANT: Evelena Leyden, [employer] medical student  EBL: 5 cc  CONDITION: Stable  COMPLICATIONS: None  INDICATION: The patient, Jennifer Barber, is a 37 y.o. female born on [birth year], is here for treatment of soft tissue mass which has been slowly growing over the past few years.  The mass is removed for pathologic diagnosis.  PROCEDURE DETAILS:  The patient was seen prior to surgery and marked.   IV antibiotics were given. The patient was taken to the operating room and given a general anesthetic. A standard time out was performed and all information was confirmed by those in the room. SCDs were placed.   The forehead was prepped and draped in usual sterile manner.  The skin was infiltrated with quarter percent Marcaine with epinephrine.  A 3 cm transverse incision was made over the most prominent portion of the mass.  Dissection was carried out down to the frontalis muscle.  The mass was under the frontalis muscle so approximately 1 cm of the muscle was divided.  A combination of sharp and blunt dissection was used to isolate the mass and remove it completely.  The mass was approximately 3 x 3 cm.  It was sent to pathology for routine examination.  The wound was inspected for bleeding and hemostasis achieved.  The frontalis muscle was approximated with interrupted 4-0 Monocryl sutures.  The dermis was closed with interrupted 4-0 Monocryl sutures and the skin was closed with interrupted 5-0 Prolene sutures.  A pressure dressing was applied and the patient was awakened from anesthesia without incident.  All instrument needle and sponge counts were reported as correct and no complications were noted during the procedure. The patient was allowed to wake  up and taken to recovery room in stable condition at the end of the case. The family was notified at the end of the case.   The advanced practice practitioner (APP) assisted throughout the case.  The APP was essential in retraction and counter traction when needed to make the case progress smoothly.  This retraction and assistance made it possible to see the tissue plans for the procedure.  The assistance was needed for blood control, tissue re-approximation and assisted with closure of the incision site.

## 2022-12-06 LAB — SURGICAL PATHOLOGY

## 2022-12-11 ENCOUNTER — Ambulatory Visit: Payer: MEDICAID | Admitting: Plastic Surgery

## 2022-12-11 ENCOUNTER — Encounter: Payer: Self-pay | Admitting: Plastic Surgery

## 2022-12-11 VITALS — BP 115/76 | HR 74

## 2022-12-11 DIAGNOSIS — Z9889 Other specified postprocedural states: Secondary | ICD-10-CM

## 2022-12-11 DIAGNOSIS — D170 Benign lipomatous neoplasm of skin and subcutaneous tissue of head, face and neck: Secondary | ICD-10-CM

## 2022-12-11 MED ORDER — CELECOXIB 100 MG PO CAPS: 100.0000 mg | ORAL_CAPSULE | Freq: Two times a day (BID) | ORAL | 0 refills | Status: AC

## 2022-12-11 NOTE — Progress Notes (Signed)
Jennifer Barber returns today 1 week postop from excision of a soft tissue mass on the forehead.  She is doing well but does note some dizziness when she bends forward and some numbness on the scalp posterior to the excision.  Her incision is clean dry and intact with no evidence of hematoma infection or any other finding which could be contributing to the numbness on the scalp.  Sutures removed today without difficulty.  She is instructed to begin scar massage in a week and also to practice sun avoidance for a year.  I will give her a prescription for Celebrex for discomfort.  And will have her follow-up in 1 to 2 months to ensure the numbness on the scalp has improved.  Pathology which showed a lipoma was reviewed with the patient.

## 2022-12-24 ENCOUNTER — Encounter: Payer: MEDICAID | Admitting: Physician Assistant

## 2023-02-12 ENCOUNTER — Encounter: Payer: MEDICAID | Admitting: Plastic Surgery

## 2023-03-10 ENCOUNTER — Encounter: Payer: Self-pay | Admitting: Physician Assistant

## 2023-03-10 ENCOUNTER — Ambulatory Visit (INDEPENDENT_AMBULATORY_CARE_PROVIDER_SITE_OTHER): Payer: MEDICAID | Admitting: Physician Assistant

## 2023-03-10 VITALS — BP 114/70 | HR 71

## 2023-03-10 DIAGNOSIS — D170 Benign lipomatous neoplasm of skin and subcutaneous tissue of head, face and neck: Secondary | ICD-10-CM

## 2023-03-10 NOTE — Progress Notes (Signed)
 Patient is a pleasant 38 year old female s/p excision of left forehead mass performed 12/04/2022 by Dr. Ladona Ridgel who presents to clinic for follow-up.  She was last seen here in clinic on 12/11/2022.  At that time, where sutures removed without complication or difficulty.  Exam was benign.  She did report some numbness involving the scalp, posterior to the excision site.  She was prescribed Celebrex and encouraged to follow-up in 1 to 2 months.  Pathology revealed lipoma.  Today, patient is doing well.  She has been making a concerted effort to keep her excision site out of the sun to avoid any hyperpigmentation.  She does report that when the excision site is touched, she feels a radiation of sensation posteriorly on the scalp.  She denies any discomfort or disabling symptoms.  She also expresses that she is interested in a breast augmentation or mastopexy.  On exam, the excision site is well-healed, hardly visible.  Buried in the hairline.  There is some evidence of new hair growth in the immediate proximity of the excision site.  No palpable underlying mass or firmness.  Suspect there is some neuropathic involvement from the excision site, but she does not experience any significant pain and no medication is indicated.  She understands that this may continue to improve spontaneously as she gets further from time of surgery.  However, it may also be permanent.  She is cleared from a postoperative standpoint and looks excellent.  She is pleased with the outcome.  If the nerve involvement becomes disabling in any way, would like her to return to our office for reevaluation.  Otherwise, she can follow-up with Dr. Ladona Ridgel for consultation regarding breast surgery.  Picture(s) obtained of the patient and placed in the chart were with the patient's or guardian's permission.
